# Patient Record
Sex: Male | Born: 1940 | Race: White | Hispanic: No | Smoking: Never smoker
Health system: Southern US, Community
[De-identification: ages and names within clinical notes are randomized; demographics above are authoritative.]

## PROBLEM LIST (undated history)

## (undated) DIAGNOSIS — I10 Essential (primary) hypertension: Secondary | ICD-10-CM

## (undated) DIAGNOSIS — G4730 Sleep apnea, unspecified: Secondary | ICD-10-CM

## (undated) DIAGNOSIS — I639 Cerebral infarction, unspecified: Secondary | ICD-10-CM

## (undated) DIAGNOSIS — Z87442 Personal history of urinary calculi: Secondary | ICD-10-CM

## (undated) DIAGNOSIS — J189 Pneumonia, unspecified organism: Secondary | ICD-10-CM

## (undated) DIAGNOSIS — J45909 Unspecified asthma, uncomplicated: Secondary | ICD-10-CM

## (undated) DIAGNOSIS — N400 Enlarged prostate without lower urinary tract symptoms: Secondary | ICD-10-CM

## (undated) DIAGNOSIS — M1990 Unspecified osteoarthritis, unspecified site: Secondary | ICD-10-CM

## (undated) DIAGNOSIS — R519 Headache, unspecified: Secondary | ICD-10-CM

## (undated) DIAGNOSIS — G459 Transient cerebral ischemic attack, unspecified: Secondary | ICD-10-CM

## (undated) DIAGNOSIS — K8020 Calculus of gallbladder without cholecystitis without obstruction: Secondary | ICD-10-CM

## (undated) DIAGNOSIS — J329 Chronic sinusitis, unspecified: Secondary | ICD-10-CM

## (undated) DIAGNOSIS — N200 Calculus of kidney: Secondary | ICD-10-CM

## (undated) HISTORY — PX: HERNIA REPAIR: SHX51

## (undated) HISTORY — PX: TONSILLECTOMY: SUR1361

## (undated) HISTORY — DX: Calculus of gallbladder without cholecystitis without obstruction: K80.20

## (undated) HISTORY — PX: INGUINAL HERNIA REPAIR: SUR1180

## (undated) HISTORY — PX: UMBILICAL HERNIA REPAIR: SHX196

## (undated) HISTORY — PX: CHOLECYSTECTOMY: SHX55

## (undated) MED ORDER — TAMSULOSIN SR 0.4 MG 24 HR CAP
0.4 mg | ORAL_CAPSULE | ORAL | Status: DC
Start: ? — End: 2013-11-11

## (undated) MED ORDER — TAMSULOSIN SR 0.4 MG 24 HR CAP
0.4 mg | ORAL_CAPSULE | ORAL | Status: DC
Start: ? — End: 2015-11-30

## (undated) MED ORDER — TAMSULOSIN SR 0.4 MG 24 HR CAP
0.4 mg | ORAL_CAPSULE | Freq: Every day | ORAL | Status: DC
Start: ? — End: 2014-11-26

---

## 1998-10-17 DIAGNOSIS — I10 Essential (primary) hypertension: Secondary | ICD-10-CM

## 1998-10-17 DIAGNOSIS — I639 Cerebral infarction, unspecified: Secondary | ICD-10-CM | POA: Insufficient documentation

## 1998-10-17 HISTORY — DX: Essential (primary) hypertension: I10

## 2007-06-04 DIAGNOSIS — J45909 Unspecified asthma, uncomplicated: Secondary | ICD-10-CM | POA: Insufficient documentation

## 2007-06-04 DIAGNOSIS — N400 Enlarged prostate without lower urinary tract symptoms: Secondary | ICD-10-CM | POA: Insufficient documentation

## 2007-09-03 DIAGNOSIS — I69998 Other sequelae following unspecified cerebrovascular disease: Secondary | ICD-10-CM | POA: Insufficient documentation

## 2007-09-03 DIAGNOSIS — H539 Unspecified visual disturbance: Secondary | ICD-10-CM | POA: Insufficient documentation

## 2007-11-20 DIAGNOSIS — B009 Herpesviral infection, unspecified: Secondary | ICD-10-CM | POA: Insufficient documentation

## 2007-11-29 LAB — PSA SCREENING (SCREENING): Prostate Specific Ag: 4.4

## 2008-04-29 LAB — PSA SCREENING (SCREENING): Prostate Specific Ag: 1.66

## 2011-04-13 DIAGNOSIS — K851 Biliary acute pancreatitis without necrosis or infection: Secondary | ICD-10-CM | POA: Insufficient documentation

## 2011-04-28 MED ORDER — FLOMAX 0.4 MG CAPSULE
0.4 mg | ORAL_CAPSULE | ORAL | Status: DC
Start: 2011-04-28 — End: 2011-06-08

## 2011-04-28 NOTE — Telephone Encounter (Signed)
Patient scheduled for appointment August 29,2012 @ 10 am

## 2011-06-08 MED ORDER — TAMSULOSIN SR 0.4 MG 24 HR CAP
0.4 mg | ORAL_CAPSULE | Freq: Two times a day (BID) | ORAL | Status: DC
Start: 2011-06-08 — End: 2011-08-02

## 2011-06-14 DIAGNOSIS — H532 Diplopia: Secondary | ICD-10-CM | POA: Insufficient documentation

## 2011-06-15 NOTE — Progress Notes (Signed)
Producer, television/film/video  Note Status  Last Update User  Last Update Date/Time     Randolph Bing, MD  Signed  Randolph Bing, MD  07/21/09 09:03 AM      Encounter Notes      The patient is a very pleasant 70 year old gentleman, followed medically by [name] who presents for evaluation of longstanding recurrent urolithiasis, dating back to age 7. . I was asked to see him in consultation to further evaluate and recommend management . He has not been evaluated for this problem for at least 10-15 years. He was recently seen by [name] last year with a dx of a bladder stone.   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 Acalculus is noted in the bladder region at the left right UVJ region. There is no associated hydronephrosis.Diverticulosis   Patient was seen last year for an elevated psa which normalized after antibx.   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 KUB to determine whether any renal stones may be amenable to ESWL showed only two 3mm stones left kidney and too small for ESWL treatmnet   2. Litholink for metabolic WU not completed   3. Stone analysis if another stone passed   4.Literature given and diet discussed re stone prevention   5.follow up to counsel re prevention if metabolic workup completed   I would like to thank Dr Glori Bickers for allowing me the opportunity to assist with his Care.   In the course of this visit on this date, I spent 15 minutes with the patient of which 50%f time involved face-to-face examination, counseling, or discussion of care.

## 2011-08-03 MED ORDER — TAMSULOSIN SR 0.4 MG 24 HR CAP
0.4 mg | ORAL_CAPSULE | ORAL | Status: DC
Start: 2011-08-03 — End: 2012-04-03

## 2011-09-22 DIAGNOSIS — K429 Umbilical hernia without obstruction or gangrene: Secondary | ICD-10-CM | POA: Insufficient documentation

## 2012-02-22 LAB — AMB POC URINALYSIS DIP STICK AUTO W/O MICRO
Bilirubin (UA POC): NEGATIVE
Blood (UA POC): NEGATIVE
Glucose (UA POC): NEGATIVE
Ketones (UA POC): NEGATIVE
Leukocyte esterase (UA POC): NEGATIVE
Nitrites (UA POC): NEGATIVE
Protein (UA POC): NEGATIVE mg/dL
Specific gravity (UA POC): 1.02 (ref 1.001–1.035)
Urobilinogen (UA POC): 0.2 (ref 0.2–1)
pH (UA POC): 6.5 (ref 4.6–8.0)

## 2012-02-22 NOTE — Progress Notes (Signed)
Created By: Randolph Bing, MD User Type: Physician Created: 07/21/2009 9:03 AM    Note Status: Signed Cosign: Cosign Not Required Note Time: 07/21/2009 9:03 AM           The patient is a very pleasant 71 year old gentleman, followed medically by [name] who presents for evaluation of longstanding recurrent urolithiasis, dating back to age 29. . I was asked to see him in consultation to further evaluate and recommend management . He has not been evaluated for this problem for at least 10-15 years. He was recently seen by [name] last year with a dx of a bladder stone.   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 Acalculus is noted in the bladder region at the left right UVJ region. There is no associated hydronephrosis.Diverticulosis   Patient was seen last year for an elevated psa which normalized after antibx.   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 KUB to determine whether any renal stones may be amenable to ESWL showed only two 3mm stones left kidney and too small for ESWL treatmnet   2. Litholink for metabolic WU not completed   3. Stone analysis if another stone passed   4.Literature given and diet discussed re stone prevention   5.follow up to counsel re prevention if metabolic workup completed   I would like to thank Dr Glori Bickers for allowing me the opportunity to assist with his Care.   In the course of this visit on this date, I spent 15 minutes with the patient of which 50%f time involved face-to-face examination, counseling, or discussion of care.                          Documentation Flowsheet Data as of 07/21/2009      No data found.                    Reason for  Visit      KIDNEY STONE                Office Visit [name]   EMR #: [id number]   07/21/2009 Office Visit                        Patient Information        Patient Information      Patient Name Sex DOB SSN    Ricardo Rivers, [place] Male 1940-12-01 [id number]               Patient Demographics      Address Phone    [address]  [place] [address] [phone] [place])  [phone] (Mobile)               Patient PCP Information      Provider PCP Type    Carney Harder, MD General               Allergies as of 07/21/2009 Reviewed on: 07/21/2009 Reviewed by: Aldean Baker H: Reviewed in Chart      Allergen Noted Type Reactions  Ivp Dye (Environmental)

## 2012-02-22 NOTE — Progress Notes (Signed)
Chief Complaint   Patient presents with   ??? Kidney Stone       Assessment:    1.63mm left distal ureteral stone, ? passed  2.Remote hx of multiple prior ureteral stones, passed      PLAN:  1. In view of hx, no further pain, the fact that he has passed multiple stones in the past, he requests period of time to see if the stone will pass spontaneosly.  2. I have explained that usually a ureteral stone passed into the bladder can be expelled.  3.Should he not pass the stone, then cysto with possible stone extraction will be considered.  4.Risks associated with the cysto and stone removal discussed including bleeding, infection, retention.  5.Additionally, explained that if the stone should be present, that indeed it might cause retention  6. Discussed details and plans, rationale for treatment and studies  7.All questions answered.  8.Cont flomax 0.8 mg daily      History of present Illness:    The patient is a very pleasant  71 yo wm  , followed medically by Dr. Olevia Perches, MD, who presents for  follow up  of left renal colic evaluated in ER [place]. Given analgesics in ER with pain disappearing. Seem 02/19/12. He is  to be seen today in follow up  to  evaluate, discuss,  and recommend management. CT reflected a 5mm left distal stone, probably passed. Patient expresses concern about not yet passing stone.  He  was last seen on 5 years ago with stone passed spontaneously, no metabolic workup done.  No further pain today, and voids well on flomax 2 daily.  No recent fever or chills      Regarding his urinary symptoms,   Hematuria: .no  Nocturia x: 1  Daytime Frequency x: Q3 hour  Urgency: no  Dysuria: no  Incontinence: no         0 pads/day  Suprapubic Pressure: no  Hesitancy: no  Decreased force of stream: no  Straining to void: no  Intermittency:: no      Review of System:    Constitutional: negative for fever, malaise/fatigue, weakness and weight loss  Cardiovascular: negative for chest pain and leg swelling  Gastrointestinal: negative for abdominal pain, diarrhea, nausea and vomiting  Integument/breast: negative for itching and rash  Hematologic/lymphatic: negative for easy bruising/bleeding  Musculoskeletal:negative for falls and myalgias  Neurological: negative for dizziness, focal weakness and LOC    Pertinent past Medical, Family and Social History:     Past Medical History   Diagnosis Date   ??? Diplopia    ??? Wheezing    ??? Nocturia    ??? Heartburn    ??? Stuffy and runny nose    ??? PND (post-nasal drip)    ??? Chronic bronchitis    ??? Asthma    ??? Community acquired pneumonia    ??? Cough    ??? Hypertension    ??? Kidney stones      Past Surgical History   Procedure Date   ??? Hx hernia repair 1978, 1985   ??? Hx knee arthroscopy 2007   ??? Hx lap cholecystectomy 04/14/2011     History     Social History   ??? Marital Status: DIVORCED     Spouse Name: N/A     Number of Children: N/A   ??? Years of Education: N/A     Occupational History   ??? Not on file.     Social History Main Topics   ??? Smoking status: Never Smoker    ??? Smokeless tobacco: Not on file   ??? Alcohol Use: 0.5 oz/week     1 Glasses of wine per week   ??? Drug Use: No   ??? Sexually Active: Not on file     Other Topics Concern   ??? Not on file     Social History Narrative   ??? No narrative on file     No family history on file.   Allergies   Allergen Reactions   ??? Iodinated Contrast Media - Iv Dye Hives     Prior to Admission medications    Medication Sig Start Date End Date Taking? Authorizing Provider   tamsulosin (FLOMAX) 0.4 mg capsule TAKE ONE CAPSULE BY MOUTH TWICE DAILY AFTER MEALS 08/02/11  Yes Randolph Bing, MD   atenolol (TENORMIN) 50 mg tablet Take  by mouth daily.     Yes Historical Provider   oxyCODONE-acetaminophen (PERCOCET) 5-325 mg per tablet Take 1 Tab by mouth every four (4) hours as needed.      Historical Provider   calcium carbonate (TUMS) 200 mg calcium (500 mg) Chew Take 1 Tab by mouth daily.      Historical Provider           Physical exam:    BP 130/82   Ht 5\' 5"  (1.651 m)   Wt 195 lb (88.451 kg)   BMI 32.45 kg/m2  WDWN  70 YO  wm in NAD, Oriented x 3  HEENT: NC, EOMI  Neck: symmetrical, supple  Chest: normal respiratory effort  Abdomen: soft, NT, no guarding/rebound, no mass appreciated/ no HSM, no CVA  BACK: Negative for spinal and CVA tenderness  Groins: no hernia appreciated, no masses or tenderness  GU: Normal circ phallus, patent meatus,no lesions  Normal scrotum w/ no lesion  Normal testes  Perineum normal with no lesions  Rectal : normal tone, no stricture  Prostate: 20  grams, smooth, symmetrical, no nodule, nontender  Seminal vesicles non-palpable  Extremities:  No c/c/e  Neuro:  A&O x 3, no focal deficits    Results for orders placed in visit on 02/22/12   AMB POC URINALYSIS DIP STICK AUTO W/O MICRO       Component Value Range    Color Yellow  (none)    Clarity Clear  (none)    Glucose Negative  (none)    Bilirubin Negative  (none)    Ketones Negative  (none)    Spec.Grav. 1.020  1.001 - 1.035    Blood Negative  (none)    pH 6.5  4.6 - 8.0    Protein, Urine Negative  Negative mg/dL    Urobilinogen 0.2 mg/dL  0.2 - 1    Nitrites Negative  (none)    Leukocyte esterase Negative  (none)         Results for orders placed in visit on 06/14/11   PSA SCREENING (SCREENING)       Component Value Range    Prostate  Specific Ag 4.4 (H)     PSA SCREENING (SCREENING)       Component Value Range    Prostate Specific Ag                                    4.4 with  1.660 (value repeated after above)            I would like to thank Dr Olevia Perches, MD  for allowing me the opportunity to assist with his care.    Tresa Endo, MD      In the course of this visit on this date, I spent 30 minutes with the patient of which 50% of time  involved face-to-face examination, counseling, or discussion of care.

## 2012-04-05 MED ORDER — TAMSULOSIN SR 0.4 MG 24 HR CAP
0.4 mg | ORAL_CAPSULE | Freq: Every day | ORAL | Status: DC
Start: 2012-04-05 — End: 2013-05-15

## 2012-10-16 NOTE — Telephone Encounter (Signed)
Patient called to report that he is going to have cataract surgery on Jan 23 and his doctor advised that he not take the medication flomax because medication may interfere with eye surgery    Pt stated that he stopped taking flomax 2 weeks ago and will have to off medication from one month to 6 weeks.  Pt states that presently his urine stream is not strong, it take a long time to urinate.   Pt wants to know if there is an alternative medication he can take    Informed pt that I will notify Dr Mayme Genta and will contact him with his response    Pt can be reached at [phone] cell

## 2012-10-16 NOTE — Telephone Encounter (Signed)
Talked with patient and told him there was no other medicine that he can take now while he has his cataract surgery. I told pt that i can teach him self cath for a period of time that he has to be off the flomax but pt does not want to do this at all Earla Charlie S. Justus Duerr, LPN

## 2013-10-17 HISTORY — PX: CATARACT EXTRACTION, BILATERAL: SHX1313

## 2013-11-13 NOTE — Telephone Encounter (Signed)
Received call 11/13/2013 from pt requesting medication (Flomax 0.4mg ) called in to pharmacy on file. Pt states someone told him that it was in. Pt spoke with pharmacist and was informed medication rx has not been received from his urologist. Pt can be reached on phone number [phone] if there are any questions.

## 2013-11-13 NOTE — Telephone Encounter (Signed)
flomax 0.4mg  2 cap qhs 3 months with 3 refills called in to pharm on file. Mechele Kittleson S. Symantha Steeber, LPN

## 2013-12-18 LAB — PSA, DIAGNOSTIC (PROSTATE SPECIFIC AG): Prostate Specific Ag: 2.7 ng/mL (ref 0–4)

## 2014-07-02 DIAGNOSIS — G4733 Obstructive sleep apnea (adult) (pediatric): Secondary | ICD-10-CM | POA: Insufficient documentation

## 2014-11-04 LAB — PSA, DIAGNOSTIC (PROSTATE SPECIFIC AG): Prostate Specific Ag: 3.4 ng/mL (ref 0–4)

## 2014-11-30 MED ORDER — TAMSULOSIN SR 0.4 MG 24 HR CAP
0.4 mg | ORAL_CAPSULE | ORAL | Status: DC
Start: 2014-11-30 — End: 2015-01-15

## 2014-12-01 NOTE — Telephone Encounter (Signed)
Pt called to get refills on med flomax. Pt was informed that flomax was called into pharmacy. I spoke with [employer] manager([facility]) to confirm the med and he stated it will be ready by 9:30 am. I informed pt that med will be ready to pick up at 9:30am. Pt is requesting to add refills to flomax so he does not have to keep calling the office. He stated it is difficult to get the med.

## 2014-12-01 NOTE — Telephone Encounter (Signed)
Pt knows i can not give him any refills  On the flomax since he has not been here since 2013. Brytni Dray S. Flossie Wexler, LPN

## 2014-12-08 ENCOUNTER — Encounter: Payer: MEDICARE | Primary: Internal Medicine

## 2015-01-15 ENCOUNTER — Ambulatory Visit: Admit: 2015-01-15 | Discharge: 2015-01-15 | Payer: MEDICARE | Attending: Urology | Primary: Internal Medicine

## 2015-01-15 ENCOUNTER — Encounter: Attending: Urology | Primary: Internal Medicine

## 2015-01-15 DIAGNOSIS — R399 Unspecified symptoms and signs involving the genitourinary system: Secondary | ICD-10-CM

## 2015-01-15 LAB — AMB POC URINALYSIS DIP STICK AUTO W/O MICRO
Bilirubin (UA POC): NEGATIVE
Blood (UA POC): NEGATIVE
Glucose (UA POC): NEGATIVE
Ketones (UA POC): NEGATIVE
Leukocyte esterase (UA POC): NEGATIVE
Nitrites (UA POC): NEGATIVE
Protein (UA POC): NEGATIVE mg/dL
Specific gravity (UA POC): 1.01 (ref 1.001–1.035)
Urobilinogen (UA POC): 0.2 (ref 0.2–1)
pH (UA POC): 6 (ref 4.6–8.0)

## 2015-01-15 LAB — AMB POC PVR, MEAS,POST-VOID RES,US,NON-IMAGING: PVR: 15 cc

## 2015-01-15 MED ORDER — TAMSULOSIN SR 0.4 MG 24 HR CAP
0.4 mg | ORAL_CAPSULE | Freq: Every day | ORAL | Status: DC
Start: 2015-01-15 — End: 2016-02-22

## 2015-01-15 NOTE — Progress Notes (Signed)
Ricardo Rivers   11-05-1940       Assessment:      ICD-10-CM ICD-9-CM    1. Lower urinary tract symptoms (LUTS) R39.9 788.99 AMB POC URINALYSIS DIP STICK AUTO W/O MICRO      AMB POC PVR, MEAS,POST-VOID RES,US,NON-IMAGING   2. Weak urinary stream R39.12 788.62 AMB POC URINALYSIS DIP STICK AUTO W/O MICRO      AMB POC PVR, MEAS,POST-VOID RES,US,NON-IMAGING   3. History of kidney stones Z87.442 V13.01 AMB POC URINALYSIS DIP STICK AUTO W/O MICRO     1. History of kidney stones with last stone passage in 2013. Currently asymptomatic.   2. BPH with LUTS, specifically weak FOS, currently on Flomax 0.8mg  daily with benefit. Requesting refills.   3. Prostate cancer screening done by PCP        PLAN:  1. Emptying well with PVR of 15cc today  2. Cont Flomax 0.8 mg daily, refill provided today   3. Continue annual PSA screening with PCP  4. FU in 1 year to reassess, sooner if problems    Chief Complaint   Patient presents with   ??? Kidney Stone       History of present Illness:    Ricardo Rivers is a 74 y.o. male who presents today in follow up for known history of kidney stones and LUTS. Patient was last seen in 02/2012. He was seen at that time for a distal ureteral stone which he passed spontaneously. No interim stone episodes. Currently asymptomatic for stone. No flank pain, nausea, or vomiting. No prior metabolic workup.     Reports weak FOS and nocturia x1 at baseline. Currently on Flomax 0.8mg  daily with benefit. Previously tried to discontinue the medication however voiding symptoms worsened. Denies gross hematuria, dysuria, urgency, or frequency.     CaP screening done by PCP per patient report.     Erectile Status: Denies ED  Review of Systems  Constitutional: Fever: No  Skin: Rash: No  HEENT: Hearing difficulty: No  Eyes: Blurred vision: No  Cardiovascular: Chest pain: No  Respiratory: Shortness of breath: No  Gastrointestinal: Nausea/vomiting: No  Musculoskeletal: Back pain: No  Neurological: Weakness: No  Psychological: Memory loss: No  Comments/additional findings:       Past Medical History   Diagnosis Date   ??? Diplopia    ??? Wheezing    ??? Nocturia    ??? Heartburn    ??? Stuffy and runny nose    ??? PND (post-nasal drip)    ??? Chronic bronchitis (HCC)    ??? Asthma    ??? Community acquired pneumonia    ??? Cough    ??? Hypertension    ??? Kidney stones    ??? Degenerative arthritis of spine      Past Surgical History   Procedure Laterality Date   ??? Hx hernia repair  1978, 1985   ??? Hx knee arthroscopy  2007   ??? Hx lap cholecystectomy  04/14/2011   ??? Hx cataract removal  2015 x 2     History     Social History   ??? Marital Status: DIVORCED     Spouse Name: N/A   ??? Number of Children: N/A   ??? Years of Education: N/A     Occupational History   ??? Not on file.     Social History Main Topics   ??? Smoking status: Never Smoker    ??? Smokeless tobacco: Not on file   ??? Alcohol Use: 0.5 oz/week     1 Glasses of wine per week   ??? Drug Use: No   ??? Sexual Activity: Not on file     Other Topics Concern   ??? Not on file     Social History Narrative     History reviewed. No pertinent family history.  Allergies   Allergen Reactions   ??? Iodinated Contrast Media - Iv Dye Hives     Prior to Admission medications    Medication Sig Start Date End Date Taking? Authorizing Provider   tamsulosin (FLOMAX) 0.4 mg capsule Take 2 Caps by mouth daily. 01/15/15  Yes Randolph BingJacob R Jasleen Riepe, MD   tamsulosin (FLOMAX) 0.4 mg capsule TAKE TWO CAPSULES BY MOUTH EVERY DAY 11/11/13  Yes Randolph BingJacob R Teeghan Hammer, MD   atenolol (TENORMIN) 50 mg tablet Take  by mouth daily.     Yes Historical  Provider   oxyCODONE-acetaminophen (PERCOCET) 5-325 mg per tablet Take 1 Tab by mouth every four (4) hours as needed.      Historical Provider   calcium carbonate (TUMS) 200 mg calcium (500 mg) Chew Take 1 Tab by mouth daily.      Historical Provider         Physical exam:    BP 120/70 mmHg   Ht 5\' 4"  (1.626 m)   Wt 200 lb (90.719 kg)   BMI 34.31 kg/m2  Constitutional: WDWN, Pleasant and appropriate affect, No acute distress.    CV:  No peripheral swelling noted  Respiratory: No respiratory distress or difficulties  Abdomen:  No abdominal masses or tenderness.  No CVA tenderness. No hernias noted.   GU Male:    ZOX:[id number]:Perineum normal to visual inspection, no erythema or irritation, Sphincter with good tone, Rectum with no hemorrhoids, fissures or masses.  Prostate is 25g, smooth, symmetric, anodular  SCROTUM:  No scrotal rash or lesions noticed.  Normal bilateral testes and epididymis.  PENIS: Urethral meatus normal in location and size. No urethral discharge.  Skin: No jaundice.    Neuro/Psych:  Alert and oriented x 3. Affect appropriate.   Lymphatic:   No enlarged inguinal lymph nodes.        Results for orders placed or performed in visit on 01/15/15   AMB POC URINALYSIS DIP STICK AUTO W/O MICRO   Result Value Ref Range    Color (UA POC) Yellow     Clarity (UA POC) Clear     Glucose (UA POC) Negative Negative    Bilirubin (UA POC) Negative Negative    Ketones (UA POC) Negative Negative    Specific gravity (UA POC) 1.010 1.001 - 1.035    Blood (UA POC) Negative Negative    pH (UA POC) 6.0 4.6 - 8.0    Protein (UA POC) Negative Negative mg/dL    Urobilinogen (UA POC) 0.2 mg/dL 0.2 - 1    Nitrites (UA POC) Negative Negative    Leukocyte esterase (UA POC) Negative Negative   AMB POC PVR, MEAS,POST-VOID RES,US,NON-IMAGING   Result Value Ref Range    PVR 15 cc       I would like to thank Dr Olevia Perches, MD  for allowing me the opportunity to assist with his care.    Tresa Endo, MD     Medical Documentation is provided with the assistance of [facility], medical scribe for Randolph Bing, MD.

## 2015-10-29 ENCOUNTER — Institutional Professional Consult (permissible substitution): Admit: 2015-10-29 | Discharge: 2015-10-30 | Payer: MEDICARE | Primary: Internal Medicine

## 2015-10-29 DIAGNOSIS — R350 Frequency of micturition: Secondary | ICD-10-CM

## 2015-10-29 LAB — AMB POC URINALYSIS DIP STICK AUTO W/O MICRO
Bilirubin (UA POC): NEGATIVE
Glucose (UA POC): NEGATIVE
Ketones (UA POC): NEGATIVE
Nitrites (UA POC): NEGATIVE
Protein (UA POC): NEGATIVE mg/dL
Specific gravity (UA POC): 1.025 (ref 1.001–1.035)
Urobilinogen (UA POC): 1 (ref 0.2–1)
pH (UA POC): 6 (ref 4.6–8.0)

## 2015-10-29 NOTE — Progress Notes (Signed)
Ricardo Rivers is here today for urine culture per Dr. Mayme Gentarucker order.    Dr. Winnifred Friarobey was in the office as incident to.        Visit Vitals   ??? Temp 98.1 ??F (36.7 ??C) (Oral)   ??? Ht 5\' 5"  (1.651 m)   ??? Wt 200 lb (90.7 kg)   ??? BMI 33.28 kg/m2        Urine was obtained by:  Clean Catch     The pain is no.  Severity:  0     Associated signs and symptoms:    urgency and frequency    Patient c/o odor to urine NO  Decreased output: NO  Difficulty voiding: NO  Burning with urination: YES  Incontinence:  NO  Split Stream: NO    Any recent Urologic surgeries or procedures:  No  If so - what type of surgery:  N/A    Any History of Stones:  YES     History of bladder tumor:  NO  Recurrent UTI's:  NO    This is not a repeat urine culture.   UA performed: Yes    Results for orders placed or performed in visit on 10/29/15   AMB POC URINALYSIS DIP STICK AUTO W/O MICRO   Result Value Ref Range    Color (UA POC) Yellow     Clarity (UA POC) Clear     Glucose (UA POC) Negative Negative    Bilirubin (UA POC) Negative Negative    Ketones (UA POC) Negative Negative    Specific gravity (UA POC) 1.025 1.001 - 1.035    Blood (UA POC) Trace Negative    pH (UA POC) 6.0 4.6 - 8.0    Protein (UA POC) Negative Negative mg/dL    Urobilinogen (UA POC) 1 mg/dL 0.2 - 1    Nitrites (UA POC) Negative Negative    Leukocyte esterase (UA POC) Trace Negative        Urine was sent to [facility] lab for processing of urine culture.   Patient is informed that it will be at least 48 hours before results are available     Orders Placed This Encounter   ??? URINE C&S     Order Specific Question:   Specify all ANTIBIOTIC ALLERGIES:     Answer:   No Known Antibiotic Allergies     Order Specific Question:   Specify the urine source     Answer:   Clean Catch   ??? AMB POC URINALYSIS DIP STICK AUTO W/O MICRO          Ricardo Rivers Ricardo Rivers      Reviewed by Randa NgoEdwin L Robey, MD on 10/29/2015.

## 2015-10-31 LAB — URINE C&S

## 2015-11-02 NOTE — Telephone Encounter (Signed)
FINAL REPORT Microbiology results ??   Comments: ANTIBIOTIC ALLERGIES*   No Known Antibiotic Allergies     URINE SOURCE:   Clean Catch     COLONY COUNT/RESULT:   20,000 cfu/ml Mixed Urogenital / Skin Flora           Called pt and informed of results.

## 2015-11-30 ENCOUNTER — Ambulatory Visit: Admit: 2015-11-30 | Discharge: 2015-11-30 | Payer: MEDICARE | Attending: Urology | Primary: Internal Medicine

## 2015-11-30 DIAGNOSIS — N400 Enlarged prostate without lower urinary tract symptoms: Secondary | ICD-10-CM

## 2015-11-30 LAB — AMB POC URINALYSIS DIP STICK AUTO W/O MICRO
Bilirubin (UA POC): NEGATIVE
Blood (UA POC): NEGATIVE
Glucose (UA POC): NEGATIVE
Ketones (UA POC): NEGATIVE
Nitrites (UA POC): NEGATIVE
Protein (UA POC): NEGATIVE mg/dL
Specific gravity (UA POC): 1.015 (ref 1.001–1.035)
Urobilinogen (UA POC): 1 (ref 0.2–1)
pH (UA POC): 6 (ref 4.6–8.0)

## 2015-11-30 LAB — AMB POC PVR, MEAS,POST-VOID RES,US,NON-IMAGING: PVR: 56 cc

## 2015-11-30 NOTE — Progress Notes (Signed)
Ricardo Rivers   1941-08-08     ASSESSMENT:    1. Right flank discomfort with history of kidney stones with last stone passage in 2013 and intermittent passage of "gravel".   2. BPH with LUTS, specifically weak FOS, currently on Flomax 0.8mg  daily  3. Prostate cancer screening done by PCP    PLAN:  1. Urine culture today - pending. Will contact him with the results.   2. Obtain a renal ultrasound and KUB at [facility] to assess his kidneys given his history of nephrolithiasis. Will contact him with the results.  3. Recommended taking Proscar 5 mg daily in view of his prostate size and urinary symptoms, but patient does not wish to start this given the possible side effects of erectile dysfunction.   4. Continue to take Flomax 0.8 mg daily.  5. Patient's BMI is out of the normal parameters. Information about BMI was given to the patient.  6. Return in 1 year for reassessment or sooner if any problems arise.     Chief Complaint   Patient presents with   ??? (LUTS) Lower Urinary Tract Symptoms   ??? Kidney Stone     Pt stated that he passed a stone 3-4 months ago    ??? Other     weak urinary stream with frequent dribbling during urination     History of Present Illness:    Ricardo Rivers is a 75 y.o. male, followed medically by Dr. Olevia Perches, who presents in follow up for BPH and history of nephrolithiasis. The patient has been doing well since his last visit. He has a weak force of stream with occasional urinary urgency, frequency, and nocturia x 0-1 at baseline. He denies any fevers, chills, nausea, vomiting, dysuria, gross hematuria, or urinary incontinence. He passed a distal ureteral stone spontaneously in 2013, but he has been passing "gravel" intermittently for the past 2-3 months. He has recovered the fragments, but he forgot to bring them into the office for analysis today. He reports having mild right flank discomfort, but he denies any abdominal pain. He continues to  take Flomax 0.8 mg daily. He tried to discontinue it in the past, but his symptoms worsened. The patient states that he is being screened for prostate cancer by his PCP, Dr. Renold Don.  Review of Systems  Constitutional: Fever: No  Skin: Rash: No  HEENT: Hearing difficulty: No  Eyes: Blurred vision: No  Cardiovascular: Chest pain: No  Respiratory: Shortness of breath: No  Gastrointestinal: Nausea/vomiting: No  Musculoskeletal: Back pain: No  Neurological: Weakness: No  Psychological: Memory loss: No  Comments/additional findings:     Past Medical History   Diagnosis Date   ??? Asthma    ??? Chronic bronchitis (HCC)    ??? Community acquired pneumonia    ??? Cough    ??? Degenerative arthritis of spine    ??? Diplopia    ??? Heartburn    ??? Hypertension    ??? Kidney stones    ??? Lower urinary tract symptoms (LUTS)    ??? Nocturia    ??? PND (post-nasal drip)    ??? Stuffy and runny nose    ??? Weak urinary stream    ??? Wheezing      Past Surgical History   Procedure Laterality Date   ??? Hx hernia repair  1978, 1985   ??? Hx knee arthroscopy  2007   ??? Hx lap cholecystectomy  04/14/2011   ??? Hx cataract removal  2015 x 2     Social History     Social History   ??? Marital status: DIVORCED     Spouse name: N/A   ??? Number of children: N/A   ??? Years of education: N/A     Occupational History   ??? Not on file.     Social History Main Topics   ??? Smoking status: Never Smoker   ??? Smokeless tobacco: Not on file   ??? Alcohol use 0.5 oz/week     1 Glasses of wine per week   ??? Drug use: No   ??? Sexual activity: Not on file     Other Topics Concern   ??? Not on file     Social History Narrative     History reviewed. No pertinent family history.  Allergies   Allergen Reactions    ??? Iodinated Contrast Media - Oral And Iv Dye Hives     Prior to Admission medications    Medication Sig Start Date End Date Taking? Authorizing Provider   atenolol (TENORMIN) 100 mg tablet  10/16/15  Yes Historical Provider   tamsulosin (FLOMAX) 0.4 mg capsule Take 2 Caps by mouth daily. 01/15/15  Yes Randolph Bing, MD     Physical Exam:    Visit Vitals   ??? BP 128/70   ??? Ht  (1.651 m)   ??? Wt 200 lb (90.7 kg)   ??? BMI 33.28 kg/m2   Constitutional: WDWN, Pleasant and appropriate affect, No acute distress.    CV:  No peripheral swelling noted  Respiratory: No respiratory distress or difficulties  Abdomen:  No abdominal masses or tenderness.  No CVA tenderness. No hernias noted.   GU Male:    DRE: Perineum normal to visual inspection, no erythema or irritation, Sphincter with good tone, Rectum with no hemorrhoids, fissures or masses. Prostate is 30-35 grams, smooth, symmetric, anodular  SCROTUM:  No scrotal rash or lesions noticed.  Normal bilateral testes and epididymis.   PENIS: Urethral meatus normal in location and size. No urethral discharge.  Skin: No jaundice.    Neuro/Psych:  Alert and oriented x 3. Affect appropriate.   Lymphatic:   No enlarged inguinal lymph nodes.      REVIEW OF LABS:  Results for orders placed or performed in visit on 11/30/15  AMB POC URINALYSIS DIP STICK AUTO W/O MICRO   Result Value Ref Range    Color (UA POC) Yellow     Clarity (UA POC) Clear     Glucose (UA POC) Negative Negative    Bilirubin (UA POC) Negative Negative    Ketones (UA POC) Negative Negative    Specific gravity (UA POC) 1.015 1.001 - 1.035    Blood (UA POC) Negative Negative    pH (UA POC) 6.0 4.6 - 8.0    Protein (UA POC) Negative Negative mg/dL    Urobilinogen (UA POC) 1 mg/dL 0.2 - 1    Nitrites (UA POC) Negative Negative    Leukocyte esterase (UA POC) Trace Negative   AMB POC PVR, MEAS,POST-VOID RES,US,NON-IMAGING   Result Value Ref Range    PVR 56 cc      I would like to thank Dr. Darl Householder. Lex for allowing me the opportunity to assist with his care.    Tresa Endo, MD    Documentation was provided with the assistance of Jake Michaelis, medical scribe for Dr. Tresa Endo on 11/30/2015.

## 2015-12-02 LAB — URINE C&S

## 2015-12-04 NOTE — Telephone Encounter (Signed)
Ricardo R Drucker, MD  Coral[facility] Ga[facility] [place] ??   ??    ??    ??   ?? Please inform U Cx neg      Called pt to inform and no answer, left VM to call back.  OK to give results.

## 2015-12-04 NOTE — Progress Notes (Signed)
Please inform U Cx neg

## 2015-12-07 ENCOUNTER — Ambulatory Visit: Admit: 2015-12-07 | Discharge: 2015-12-08 | Payer: MEDICARE | Attending: Urology | Primary: Internal Medicine

## 2015-12-07 DIAGNOSIS — N4889 Other specified disorders of penis: Secondary | ICD-10-CM

## 2015-12-07 LAB — AMB POC URINALYSIS DIP STICK AUTO W/O MICRO
Glucose (UA POC): NEGATIVE
Ketones (UA POC): NEGATIVE
Leukocyte esterase (UA POC): NEGATIVE
Nitrites (UA POC): NEGATIVE
Protein (UA POC): NEGATIVE mg/dL
Specific gravity (UA POC): 1.025 (ref 1.001–1.035)
Urobilinogen (UA POC): 1 (ref 0.2–1)
pH (UA POC): 5.5 (ref 4.6–8.0)

## 2015-12-07 MED ORDER — CEPHALEXIN 500 MG CAP
500 mg | ORAL_CAPSULE | Freq: Three times a day (TID) | ORAL | 0 refills | Status: AC
Start: 2015-12-07 — End: 2015-12-14

## 2015-12-07 NOTE — Progress Notes (Signed)
Ricardo Rivers  DOB 01-18-1941      ASSESSMENT:   Encounter Diagnoses     ICD-10-CM ICD-9-CM   1. Penile irritation N48.89 607.89               PLAN:    Keflex TID x7 days, rx provided.   OTC abx ointment to glans  Urine for culture      Chief Complaint   Patient presents with   ??? Other     Pt states that he is having  pain on the tip of his penis and that his penis is sore, Pt states that it burns when he urinates          HISTORY OF PRESENT ILLNESS:   Ricardo Rivers is a 75 y.o. male who presents today for evaluation and treatment of penile irritation and dysuria. Patient followed by Dr. Mayme Genta, last seen 11/30/2015 for BPH and history of nephrolithiasis.      Recently with slight trauma to skin at tip of penis during oral intercourse.   Urine culture 11/30/2015 contaminated urine specimen.   Reports increasing discomfort after appt with Dr. Mayme Genta.   Now with redness at the tip of penis.   No recent abx course.   Tenderness present consitently.   Dysuria with urination.   No associated gross hematuria.   No f/c/n/v.     On Flomax 0.8mg  daily for LUTS.   No bothersome symptoms at this time.   No change in urination since last seen.       AUA Symptom Score 12/07/2015   Over the past month how often have you had the sensation that your bladder was not completely empty after you finished urinating? 3   Over the past month, how often have had to urinate again less than 2 hours after you last finished urinating? 3   Over the past month, how often have you found you stopped and started again several times when you urinated? 5   Over the past month, how often have you found it difficult to postpone urination? 3   Over the past month, how often have you had a weak urinary stream? 5   Over the past month, how often have you had to push or strain to begin urinating? 3   Over the past month, how many times did you most typically get up to urinate from the time you went to bed at night until the time you got up  in the morning? 1   AUA Score 23   If you were to spend the rest of your life with your urinary condition the way it is now, how would you feel about that? Unhappy        Past Medical History   Diagnosis Date   ??? Asthma    ??? Chronic bronchitis (HCC)    ??? Community acquired pneumonia    ??? Cough    ??? Degenerative arthritis of spine    ??? Diplopia    ??? Heartburn    ??? Hypertension    ??? Kidney stones    ??? Lower urinary tract symptoms (LUTS)    ??? Nocturia    ??? PND (post-nasal drip)    ??? Stuffy and runny nose    ??? Weak urinary stream    ??? Wheezing      Past Surgical History   Procedure Laterality Date   ??? Hx hernia repair  1978, 1985   ??? Hx knee arthroscopy  2007   ???  Hx lap cholecystectomy  04/14/2011   ??? Hx cataract removal  2015 x 2     No family history on file.  Social History     Social History   ??? Marital status: DIVORCED     Spouse name: N/A   ??? Number of children: N/A   ??? Years of education: N/A     Occupational History   ??? Not on file.     Social History Main Topics   ??? Smoking status: Never Smoker   ??? Smokeless tobacco: Not on file   ??? Alcohol use 0.5 oz/week     1 Glasses of wine per week   ??? Drug use: No   ??? Sexual activity: Not on file     Other Topics Concern   ??? Not on file     Social History Narrative     Allergies   Allergen Reactions   ??? Iodinated Contrast Media - Oral And Iv Dye Hives     Current Outpatient Prescriptions   Medication Sig Dispense Refill   ??? cephALEXin (KEFLEX) 500 mg capsule Take 1 Cap by mouth three (3) times daily for 7 days. 21 Cap 0   ??? atenolol (TENORMIN) 100 mg tablet      ??? tamsulosin (FLOMAX) 0.4 mg capsule Take 2 Caps by mouth daily. 180 Cap 3         Review of Systems  Constitutional: Fever: No  Skin: Rash: No  HEENT: Hearing difficulty: No  Eyes: Blurred vision: No  Cardiovascular: Chest pain: No  Respiratory: Shortness of breath: No  Gastrointestinal: Nausea/vomiting: No  Musculoskeletal: Back pain: Yes  Neurological: Weakness: No  Psychological: Memory loss: No   Comments/additional findings:       Physical Examination  Visit Vitals   ??? BP 122/80   ??? Ht  (1.651 m)   ??? Wt 200 lb (90.7 kg)   ??? BMI 33.28 kg/m2     Constitutional: WDWN, Pleasant and appropriate affect, No acute distress.    CV:  No peripheral swelling noted  Respiratory: No respiratory distress or difficulties  Abdomen:  No abdominal masses or tenderness.  No CVA tenderness.   GU Male:    SCROTUM:  No scrotal rash or lesions noticed.  Normal bilateral testes and epididymis.   PENIS: Urethral meatus normal in location and size. Erythema at tip of penis with slight tearing of skin, some golden eschar overlying tear in skin.   Skin: No jaundice.    Neuro/Psych:  Alert and oriented x 3. Affect appropriate.       Results for orders placed or performed in visit on 11/30/15   URINE C&S   Result Value Ref Range    FINAL REPORT Microbiology results    AMB POC URINALYSIS DIP STICK AUTO W/O MICRO   Result Value Ref Range    Color (UA POC) Yellow     Clarity (UA POC) Clear     Glucose (UA POC) Negative Negative    Bilirubin (UA POC) Negative Negative    Ketones (UA POC) Negative Negative    Specific gravity (UA POC) 1.015 1.001 - 1.035    Blood (UA POC) Negative Negative    pH (UA POC) 6.0 4.6 - 8.0    Protein (UA POC) Negative Negative mg/dL    Urobilinogen (UA POC) 1 mg/dL 0.2 - 1    Nitrites (UA POC) Negative Negative    Leukocyte esterase (UA POC) Trace Negative   AMB POC PVR, MEAS,POST-VOID RES,US,NON-IMAGING  Result Value Ref Range    PVR 56 cc            Laurier Nancy, MD on 12/07/2015       Medical Documentation is provided with the assistance of Sabrina A. Delfenthal, Medical Scribe for Laurier Nancy, MD on 12/07/2015

## 2015-12-07 NOTE — Telephone Encounter (Signed)
Pt contacted to discuss his negative urine culture. Pt states that jon Saturday he began having pain in the ip of his penis. He reported to an urgent care, with no real answer. He was told if it continues to f/u with Urology. He then had an increase in the pain over night and called this AM for guidance. He was instructed to come in this afternoon to be seen.

## 2015-12-09 LAB — URINE C&S

## 2015-12-09 NOTE — Telephone Encounter (Signed)
Patient called stated he had an appointment on Monday with Dr. Maple Hudson.The medication he is on has not helped and the pain is getting worse. He is requesting a return call @ [phone].

## 2015-12-11 NOTE — Telephone Encounter (Signed)
Unable to reach patient    Ricardo Erney D Agron Swiney, MD

## 2015-12-14 NOTE — Telephone Encounter (Signed)
Unable to reach patient  Left Vm to call back if he is still having issues    Laurier Nancy, MD

## 2015-12-15 DIAGNOSIS — E6609 Other obesity due to excess calories: Secondary | ICD-10-CM | POA: Insufficient documentation

## 2016-01-18 ENCOUNTER — Encounter: Attending: Urology | Primary: Internal Medicine

## 2016-02-10 NOTE — Telephone Encounter (Signed)
Patient called trying to get an appointment with Dr. Mayme Gentarucker. He is experiencing a weak urine stream that stops in the middle of urinating. He is requesting a return call @ [phone]. I offered him 03/03/16 he felt that was to far out.

## 2016-02-11 NOTE — Telephone Encounter (Signed)
Patient called trying to get an appointment with Dr. Mayme Gentarucker. He is experiencing a weak urine stream that stops in the middle of urinating. He is requesting a return call @ [phone]. I offered him 03/03/16 he felt that was to far out    Returned patients call to offer him to come in for a urine drop off while he can see Dr. Mayme Gentarucker. No answer, left VM to call back.

## 2016-02-15 ENCOUNTER — Ambulatory Visit: Admit: 2016-02-15 | Discharge: 2016-02-15 | Payer: MEDICARE | Attending: Urology | Primary: Internal Medicine

## 2016-02-15 DIAGNOSIS — N400 Enlarged prostate without lower urinary tract symptoms: Secondary | ICD-10-CM

## 2016-02-15 LAB — AMB POC URINALYSIS DIP STICK AUTO W/O MICRO
Bilirubin (UA POC): NEGATIVE
Blood (UA POC): NEGATIVE
Glucose (UA POC): NEGATIVE
Ketones (UA POC): NEGATIVE
Leukocyte esterase (UA POC): NEGATIVE
Nitrites (UA POC): NEGATIVE
Protein (UA POC): NEGATIVE mg/dL
Specific gravity (UA POC): 1.02 (ref 1.001–1.035)
Urobilinogen (UA POC): 0.2 (ref 0.2–1)
pH (UA POC): 6 (ref 4.6–8.0)

## 2016-02-15 LAB — AMB POC PVR, MEAS,POST-VOID RES,US,NON-IMAGING: PVR: 34 cc

## 2016-02-15 NOTE — Progress Notes (Signed)
Patient called trying to get an appointment with Dr. Mayme Gentarucker. He is experiencing a weak urine stream that stops in the middle of urinating. He is requesting a return call @ [phone]. I offered him 03/03/16 he felt that was to far out  ??  Returned patients call to offer him to come in for a urine drop off while he can see Dr. Mayme Gentarucker. No answer, left VM to call back    Patient seen by Dr. Mayme Gentarucker today 02/15/2016

## 2016-02-15 NOTE — Progress Notes (Signed)
Ricardo Rivers   07/25/1941     ASSESSMENT:    1. BPH with LUTS, specifically weak FOS, on Flomax 0.8 mg daily, PVR 34 cc today (02/15/16)  2. History of nephrolithiasis with last stone passage in 2013 and intermittent passage of "gravel"   3. Prostate cancer screening done by PCP    PLAN:  1. Recommended performing a Urodynamics Study to assess his bladder function. Patient indicates understanding and agrees to proceed with this.  2. Continue to take Flomax 0.8 mg daily.  3. Perform a 24-hour urinalysis to determine the cause of his stones.  4. Patient's BMI is out of the normal parameters. Information about BMI was given to the patient.  5. Follow up to review the results of his Urodynamics Study and Litholink and proceed with a cystoscopy.     Chief Complaint   Patient presents with   ??? Urinary Frequency   ??? Urgency   ??? Benign Prostatic Hypertrophy     History of Present Illness:    Ricardo Rivers is a 75 y.o. Caucasian male, followed medically by Ricardo Rivers Lex, who presents in follow up for BPH and history of nephrolithiasis. The patient has been doing well since his last visit. He states that he has passed 3 stones in the interim. He has a good force of stream in the morning, but it can be weak at night with urinary hesitancy. He states that it slows to a dribbling stream at times. He also reports occasional urinary urgency, frequency, and nocturia x 0-1 at baseline. He denies any fevers, chills, nausea, vomiting, dysuria, gross hematuria, urinary incontinence, abdominal pain, or flank pain. He continues to take Flomax 0.8 mg daily. He tried to discontinue it in the past, but his symptoms worsened. The patient states that he is being screened for prostate cancer by his PCP, Ricardo Rivers.                                                                                                                                                                                                                                                                                Review of Systems  Constitutional: Fever: No  Skin: Rash: No  HEENT: Hearing difficulty: No  Eyes: Blurred vision: No  Cardiovascular: Chest pain: No  Respiratory: Shortness of breath: No  Gastrointestinal: Nausea/vomiting: No  Musculoskeletal: Back pain: No  Neurological: Weakness: No  Psychological: Memory loss: No  Comments/additional findings:     Past Medical History:   Diagnosis Date   ??? Asthma    ??? Chronic bronchitis (HCC)    ??? Community acquired pneumonia    ??? Cough    ??? Degenerative arthritis of spine    ??? Diplopia    ??? Heartburn    ??? Hypertension    ??? Kidney stones    ??? Lower urinary tract symptoms (LUTS)    ??? Nocturia    ??? PND (post-nasal drip)    ??? Stuffy and runny nose    ??? Weak urinary stream    ??? Wheezing      Past Surgical History:   Procedure Laterality Date   ??? HX CATARACT REMOVAL  2015 x 2   ??? HX HERNIA REPAIR  1978, 1985   ??? HX KNEE ARTHROSCOPY  2007   ??? HX LAP CHOLECYSTECTOMY  04/14/2011     Social History     Social History   ??? Marital status: DIVORCED     Spouse name: N/A   ??? Number of children: N/A   ??? Years of education: N/A     Occupational History   ??? Not on file.     Social History Main Topics   ??? Smoking status: Never Smoker   ??? Smokeless tobacco: Not on file   ??? Alcohol use 0.5 oz/week     1 Glasses of wine per week   ??? Drug use: No   ??? Sexual activity: Not on file     Other Topics Concern   ??? Not on file     Social History Narrative     History reviewed. No pertinent family history.  Allergies   Allergen Reactions   ??? Iodinated Contrast Media - Oral And Iv Dye Hives     Prior to Admission medications    Medication Sig Start Date End Date Taking? Authorizing Provider   atenolol (TENORMIN) 100 mg tablet  10/16/15  Yes Historical Provider   tamsulosin (FLOMAX) 0.4 mg capsule Take 2 Caps by mouth daily. 01/15/15  Yes Ricardo Bing, MD     Physical Exam:    Visit Vitals    ??? BP 135/82   ??? Ht  (1.651 m)   ??? Wt 200 lb (90.7 kg)   ??? BMI 33.28 kg/m2   Constitutional: WDWN, Pleasant and appropriate affect, No acute distress.    CV:  No peripheral swelling noted  Respiratory: No respiratory distress or difficulties  Abdomen:  No abdominal masses or tenderness.  No CVA tenderness. No hernias noted.   GU Male (02/15/16):    DRE: Perineum normal to visual inspection, no erythema or irritation, Sphincter with good tone, Rectum with no hemorrhoids, fissures or masses. Prostate is 25 grams, smooth, symmetric, anodular  SCROTUM:  No scrotal rash or lesions noticed.  Normal bilateral testes and epididymis.   PENIS: Urethral meatus normal in location and size. No urethral discharge.  Skin: No jaundice.    Neuro/Psych:  Alert and oriented x 3. Affect appropriate.   Lymphatic:   No enlarged inguinal lymph nodes.      REVIEW OF LABS:  Results for orders placed or performed in visit on 02/15/16   AMB POC URINALYSIS DIP STICK  AUTO W/O MICRO   Result Value Ref Range    Color (UA POC) Yellow     Clarity (UA POC) Clear     Glucose (UA POC) Negative Negative    Bilirubin (UA POC) Negative Negative    Ketones (UA POC) Negative Negative    Specific gravity (UA POC) 1.020 1.001 - 1.035    Blood (UA POC) Negative Negative    pH (UA POC) 6.0 4.6 - 8.0    Protein (UA POC) Negative Negative mg/dL    Urobilinogen (UA POC) 0.2 mg/dL 0.2 - 1    Nitrites (UA POC) Negative Negative    Leukocyte esterase (UA POC) Negative Negative   AMB POC PVR, MEAS,POST-VOID RES,US,NON-IMAGING   Result Value Ref Range    PVR 34 cc     I would like to thank Dr. Darl HouseholderAlison M. Lex for allowing me the opportunity to assist with his care.    Tresa EndoJack Loveta Dellis, MD    Documentation was provided with the assistance of Jake MichaelisMichelle Li, medical scribe for Dr. Tresa EndoJack Lynnel Zanetti on 02/15/2016.

## 2016-02-16 ENCOUNTER — Encounter

## 2016-02-22 MED ORDER — TAMSULOSIN SR 0.4 MG 24 HR CAP
0.4 mg | ORAL_CAPSULE | ORAL | 0 refills | Status: DC
Start: 2016-02-22 — End: 2016-05-28

## 2016-02-24 ENCOUNTER — Encounter: Primary: Internal Medicine

## 2016-03-11 DIAGNOSIS — L304 Erythema intertrigo: Secondary | ICD-10-CM | POA: Insufficient documentation

## 2016-03-11 DIAGNOSIS — L702 Acne varioliformis: Secondary | ICD-10-CM | POA: Insufficient documentation

## 2016-03-22 NOTE — Telephone Encounter (Signed)
Patient has a F/U appointment with Dr. Mayme Gentarucker on 03/24/2016 to review litholink and UDS. Litholink results are not in the chart as well as any UDS visit. Called pt to ask if he got them performed and no answer, left VM to call back.

## 2016-03-24 ENCOUNTER — Encounter: Attending: Urology | Primary: Internal Medicine

## 2016-04-28 ENCOUNTER — Ambulatory Visit: Admit: 2016-04-28 | Discharge: 2016-04-28 | Payer: MEDICARE | Attending: Urology | Primary: Internal Medicine

## 2016-04-28 DIAGNOSIS — Z87442 Personal history of urinary calculi: Secondary | ICD-10-CM | POA: Insufficient documentation

## 2016-04-28 DIAGNOSIS — N400 Enlarged prostate without lower urinary tract symptoms: Secondary | ICD-10-CM

## 2016-04-28 LAB — AMB POC URINALYSIS DIP STICK AUTO W/O MICRO
Bilirubin (UA POC): NEGATIVE
Blood (UA POC): NEGATIVE
Glucose (UA POC): NEGATIVE
Ketones (UA POC): NEGATIVE
Nitrites (UA POC): NEGATIVE
Protein (UA POC): NEGATIVE mg/dL
Specific gravity (UA POC): 1.02 (ref 1.001–1.035)
Urobilinogen (UA POC): 1 (ref 0.2–1)
pH (UA POC): 5.5 (ref 4.6–8.0)

## 2016-04-28 LAB — AMB POC PVR, MEAS,POST-VOID RES,US,NON-IMAGING: PVR: 12 cc

## 2016-04-28 NOTE — Progress Notes (Signed)
Ricardo Rivers   [birth year]     ASSESSMENT:    1. BPH with LUTS, specifically weak FOS, improved on Flomax 0.8 mg daily, PVR 12 cc today (04/28/16)  2. History of nephrolithiasis with last stone passage in 2013 and intermittent passage of "gravel"   3. Prostate cancer screening done by PCP    PLAN:  1. Explained that he may experience spasms of his pelvic floor musculature following ejaculation.  2. Explained that his symptoms may have been related to prostatitis, but patient is not interested in treatment for this.  3. Patient is doing well. He does not have any urinary complaints. He is not interested in pursuing an evaluation for his previous symptoms.  4. Continue to take Flomax 0.8 mg daily.  5. Advised him to increase his daily fluid and citrate intake for stone prevention.   6. Patient's BMI is out of the normal parameters. Information about BMI was given to the patient.  7. Follow up in 1 year for reassessment.     Chief Complaint   Patient presents with   ??? Incomplete Bladder Emptying     History of Present Illness:    Ricardo Rivers is a 75 y.o. Caucasian male with a history of BPH and history of nephrolithiasis, followed medically by Dr. Darl HouseholderAlison M. Lex, who presents today complaining of urinary difficulty following sexual intercourse. He states that he had urinary hesitancy and intermittency a few times following sexual activity, but he does not have any urinary problems currently. He thinks that it may have overstrained his pelvic muscles. He does not have any urinary complaints at this time. He has a good force of stream in the morning, but it can be weak at night with urinary hesitancy. He denies any fevers, chills, nausea, vomiting, dysuria, gross hematuria, urinary incontinence, perineal pain, abdominal pain, or flank pain. He continues to take Flomax 0.8 mg daily with benefit. He tried to discontinue it in the past, but his symptoms  worsened. There were plans to schedule him for a Urodynamics Study, but he cancelled it because his symptoms resolved. He was also supposed to complete a metabolic urine evaluation ([facility]) prior to this visit, but he decided that he didn't want to do it. The patient states that he is being screened for prostate cancer by his PCP, Dr. Renold DonLex.  Review of Systems  Constitutional: Fever: No  Skin: Rash: No  HEENT: Hearing difficulty: No  Eyes: Blurred vision: No  Cardiovascular: Chest pain: No  Respiratory: Shortness of breath: No  Gastrointestinal: Nausea/vomiting: No  Musculoskeletal: Back pain: No  Neurological: Weakness: No  Psychological: Memory loss: No  Comments/additional findings:     Past Medical History:   Diagnosis Date   ??? Asthma    ??? Chronic bronchitis (HCC)    ??? Community acquired pneumonia    ??? Cough    ??? Degenerative arthritis of spine    ??? Diplopia    ??? Heartburn    ??? Hypertension    ??? Kidney stones    ??? Lower urinary tract symptoms (LUTS)    ??? Nocturia    ??? PND (post-nasal drip)    ??? Stuffy and runny nose    ??? Weak urinary stream    ??? Wheezing      Past Surgical History:   Procedure Laterality Date   ??? HX CATARACT REMOVAL  2015 x 2   ??? HX HERNIA REPAIR  1978, 1985   ??? HX KNEE ARTHROSCOPY  2007   ??? HX LAP CHOLECYSTECTOMY  04/14/2011     Social History     Social History   ??? Marital status: DIVORCED     Spouse name: N/A   ??? Number of children: N/A   ??? Years of education: N/A     Occupational History   ??? Not on file.     Social History Main Topics   ??? Smoking status: Never Smoker   ??? Smokeless tobacco: Never Used   ??? Alcohol use 0.5 oz/week     1 Glasses of wine per week   ??? Drug use: No   ??? Sexual activity: Not on file      Other Topics Concern   ??? Not on file     Social History Narrative     History reviewed. No pertinent family history.  Allergies   Allergen Reactions   ??? Iodinated Contrast- Oral And Iv Dye Hives     Prior to Admission medications    Medication Sig Start Date End Date Taking? Authorizing Provider   ciclopirox (LOPROX) 1 % sham  03/11/16  Yes Historical Provider   clindamycin (CLEOCIN T) 1 % external solution  03/15/16  Yes Historical Provider   tamsulosin (FLOMAX) 0.4 mg capsule TAKE 2 CAPSULES BY MOUTH DAILY 02/22/16  Yes Randolph Bing, MD   atenolol (TENORMIN) 100 mg tablet  10/16/15  Yes Historical Provider   oxyCODONE-acetaminophen (PERCOCET 10) 10-325 mg per tablet  04/27/16   Historical Provider     Physical Exam:    Visit Vitals   ??? BP 112/78   ??? Ht  (1.651 m)   ??? Wt 200 lb (90.7 kg)   ??? BMI 33.28 kg/m2   Constitutional: WDWN, Pleasant and appropriate affect, No acute distress.    CV:  No peripheral swelling noted  Respiratory: No respiratory distress or difficulties  Abdomen:  No abdominal masses or tenderness.  No CVA tenderness. No hernias noted.   GU Male (04/28/16):    DRE: Perineum normal to visual inspection, no erythema or irritation, Sphincter with good tone, Rectum with no hemorrhoids, fissures or masses. Prostate is 25 grams, smooth, symmetric, anodular, unchanged.  SCROTUM:  No scrotal rash or lesions noticed.  Normal bilateral testes and epididymis.   PENIS: Urethral meatus normal in location and size. No urethral discharge.  Skin: No  jaundice.    Neuro/Psych:  Alert and oriented x 3. Affect appropriate.   Lymphatic:   No enlarged inguinal lymph nodes.      REVIEW OF LABS:  Results for orders placed or performed in visit on 04/28/16   AMB POC URINALYSIS DIP STICK AUTO W/O MICRO   Result Value Ref Range    Color (UA POC) Yellow     Clarity (UA POC) Clear     Glucose (UA POC) Negative Negative    Bilirubin (UA POC) Negative Negative    Ketones (UA POC) Negative Negative     Specific gravity (UA POC) 1.020 1.001 - 1.035    Blood (UA POC) Negative Negative    pH (UA POC) 5.5 4.6 - 8.0    Protein (UA POC) Negative Negative mg/dL    Urobilinogen (UA POC) 1 mg/dL 0.2 - 1    Nitrites (UA POC) Negative Negative    Leukocyte esterase (UA POC) Trace Negative   AMB POC PVR, MEAS,POST-VOID RES,US,NON-IMAGING   Result Value Ref Range    PVR 12 cc     I would like to thank Dr. Darl Householder. Lex for allowing me the opportunity to assist with his care.    Tresa Endo, MD    Documentation was provided with the assistance of Jake Michaelis, medical scribe for Dr. Tresa Endo on 04/28/2016.

## 2016-05-30 MED ORDER — TAMSULOSIN SR 0.4 MG 24 HR CAP
0.4 mg | ORAL_CAPSULE | ORAL | 0 refills | Status: DC
Start: 2016-05-30 — End: 2016-08-29

## 2016-08-29 MED ORDER — TAMSULOSIN SR 0.4 MG 24 HR CAP
0.4 mg | ORAL_CAPSULE | ORAL | 0 refills | Status: DC
Start: 2016-08-29 — End: 2016-11-29

## 2016-10-18 DIAGNOSIS — M25511 Pain in right shoulder: Secondary | ICD-10-CM | POA: Diagnosis not present

## 2016-10-18 DIAGNOSIS — M064 Inflammatory polyarthropathy: Secondary | ICD-10-CM | POA: Diagnosis not present

## 2016-10-18 DIAGNOSIS — M150 Primary generalized (osteo)arthritis: Secondary | ICD-10-CM | POA: Diagnosis not present

## 2016-10-18 DIAGNOSIS — M160 Bilateral primary osteoarthritis of hip: Secondary | ICD-10-CM | POA: Diagnosis not present

## 2016-10-18 DIAGNOSIS — M25541 Pain in joints of right hand: Secondary | ICD-10-CM | POA: Diagnosis not present

## 2016-10-18 DIAGNOSIS — M25512 Pain in left shoulder: Secondary | ICD-10-CM | POA: Diagnosis not present

## 2016-10-18 DIAGNOSIS — M545 Low back pain: Secondary | ICD-10-CM | POA: Diagnosis not present

## 2016-10-26 DIAGNOSIS — L219 Seborrheic dermatitis, unspecified: Secondary | ICD-10-CM | POA: Diagnosis not present

## 2016-10-27 DIAGNOSIS — Z23 Encounter for immunization: Secondary | ICD-10-CM | POA: Diagnosis not present

## 2016-10-27 DIAGNOSIS — I10 Essential (primary) hypertension: Secondary | ICD-10-CM | POA: Diagnosis not present

## 2016-10-27 DIAGNOSIS — M549 Dorsalgia, unspecified: Secondary | ICD-10-CM | POA: Diagnosis not present

## 2016-11-09 DIAGNOSIS — R351 Nocturia: Secondary | ICD-10-CM | POA: Diagnosis not present

## 2016-11-09 DIAGNOSIS — Z91041 Radiographic dye allergy status: Secondary | ICD-10-CM | POA: Diagnosis not present

## 2016-11-09 DIAGNOSIS — Z79891 Long term (current) use of opiate analgesic: Secondary | ICD-10-CM | POA: Diagnosis not present

## 2016-11-09 DIAGNOSIS — M5116 Intervertebral disc disorders with radiculopathy, lumbar region: Secondary | ICD-10-CM | POA: Diagnosis not present

## 2016-11-09 DIAGNOSIS — M5416 Radiculopathy, lumbar region: Secondary | ICD-10-CM | POA: Diagnosis not present

## 2016-11-09 DIAGNOSIS — N401 Enlarged prostate with lower urinary tract symptoms: Secondary | ICD-10-CM | POA: Diagnosis not present

## 2016-11-09 DIAGNOSIS — G9009 Other idiopathic peripheral autonomic neuropathy: Secondary | ICD-10-CM | POA: Diagnosis not present

## 2016-11-09 DIAGNOSIS — M48061 Spinal stenosis, lumbar region without neurogenic claudication: Secondary | ICD-10-CM | POA: Diagnosis not present

## 2016-11-09 DIAGNOSIS — J45909 Unspecified asthma, uncomplicated: Secondary | ICD-10-CM | POA: Diagnosis not present

## 2016-11-09 DIAGNOSIS — Z79899 Other long term (current) drug therapy: Secondary | ICD-10-CM | POA: Diagnosis not present

## 2016-11-09 DIAGNOSIS — I10 Essential (primary) hypertension: Secondary | ICD-10-CM | POA: Diagnosis not present

## 2016-11-09 DIAGNOSIS — M5126 Other intervertebral disc displacement, lumbar region: Secondary | ICD-10-CM | POA: Diagnosis not present

## 2016-11-28 ENCOUNTER — Encounter: Attending: Urology | Primary: Internal Medicine

## 2016-12-01 MED ORDER — TAMSULOSIN SR 0.4 MG 24 HR CAP
0.4 mg | ORAL_CAPSULE | ORAL | 0 refills | Status: DC
Start: 2016-12-01 — End: 2017-03-07

## 2016-12-01 NOTE — Telephone Encounter (Signed)
S/w pt and [facility] him [facility]. Pt thanked me and call ended.  Ricardo Rivers

## 2016-12-16 DIAGNOSIS — R591 Generalized enlarged lymph nodes: Secondary | ICD-10-CM | POA: Diagnosis not present

## 2016-12-22 DIAGNOSIS — L720 Epidermal cyst: Secondary | ICD-10-CM | POA: Diagnosis not present

## 2017-01-24 DIAGNOSIS — L299 Pruritus, unspecified: Secondary | ICD-10-CM | POA: Diagnosis not present

## 2017-02-09 DIAGNOSIS — I10 Essential (primary) hypertension: Secondary | ICD-10-CM | POA: Diagnosis not present

## 2017-02-16 DIAGNOSIS — L720 Epidermal cyst: Secondary | ICD-10-CM | POA: Diagnosis not present

## 2017-02-16 DIAGNOSIS — D485 Neoplasm of uncertain behavior of skin: Secondary | ICD-10-CM | POA: Diagnosis not present

## 2017-02-17 DIAGNOSIS — G4733 Obstructive sleep apnea (adult) (pediatric): Secondary | ICD-10-CM | POA: Diagnosis not present

## 2017-02-17 DIAGNOSIS — E6609 Other obesity due to excess calories: Secondary | ICD-10-CM | POA: Diagnosis not present

## 2017-02-17 DIAGNOSIS — I10 Essential (primary) hypertension: Secondary | ICD-10-CM | POA: Diagnosis not present

## 2017-02-18 DIAGNOSIS — D485 Neoplasm of uncertain behavior of skin: Secondary | ICD-10-CM | POA: Diagnosis not present

## 2017-02-28 DIAGNOSIS — J329 Chronic sinusitis, unspecified: Secondary | ICD-10-CM | POA: Diagnosis not present

## 2017-02-28 DIAGNOSIS — J342 Deviated nasal septum: Secondary | ICD-10-CM | POA: Diagnosis not present

## 2017-02-28 DIAGNOSIS — J343 Hypertrophy of nasal turbinates: Secondary | ICD-10-CM | POA: Diagnosis not present

## 2017-03-07 MED ORDER — TAMSULOSIN SR 0.4 MG 24 HR CAP
0.4 mg | ORAL_CAPSULE | Freq: Every day | ORAL | 1 refills | Status: DC
Start: 2017-03-07 — End: 2017-03-15

## 2017-03-15 ENCOUNTER — Ambulatory Visit: Admit: 2017-03-15 | Discharge: 2017-03-15 | Payer: MEDICARE | Attending: Acute Care | Primary: Internal Medicine

## 2017-03-15 DIAGNOSIS — Z87442 Personal history of urinary calculi: Secondary | ICD-10-CM | POA: Diagnosis not present

## 2017-03-15 DIAGNOSIS — N200 Calculus of kidney: Secondary | ICD-10-CM | POA: Diagnosis not present

## 2017-03-15 DIAGNOSIS — N401 Enlarged prostate with lower urinary tract symptoms: Secondary | ICD-10-CM | POA: Diagnosis not present

## 2017-03-15 DIAGNOSIS — R361 Hematospermia: Secondary | ICD-10-CM | POA: Diagnosis not present

## 2017-03-15 LAB — AMB POC URINALYSIS DIP STICK AUTO W/O MICRO
Bilirubin (UA POC): NEGATIVE
Blood (UA POC): NEGATIVE
Glucose (UA POC): NEGATIVE
Ketones (UA POC): NEGATIVE
Leukocyte esterase (UA POC): NEGATIVE
Nitrites (UA POC): NEGATIVE
Protein (UA POC): NEGATIVE
Specific gravity (UA POC): 1.02 (ref 1.001–1.035)
Urobilinogen (UA POC): 1 (ref 0.2–1)
pH (UA POC): 6 (ref 4.6–8.0)

## 2017-03-15 LAB — AMB POC PVR, MEAS,POST-VOID RES,US,NON-IMAGING: PVR: 33 cc

## 2017-03-15 MED ORDER — TAMSULOSIN SR 0.4 MG 24 HR CAP
0.4 mg | ORAL_CAPSULE | Freq: Every day | ORAL | 3 refills | Status: DC
Start: 2017-03-15 — End: 2017-04-24

## 2017-03-15 NOTE — Progress Notes (Signed)
Ricardo Rivers   Mar 03, 1941     ASSESSMENT:    1. BPH with LUTS, specifically weak FOS, improved on Flomax 0.8 mg daily, PVR 12 cc today (04/28/16)  2. History of nephrolithiasis with last stone passage in 2013 and intermittent passage of "gravel"   3. Prostate cancer screening done by PCP  4. Hematospermia     PLAN:  KUB performed today- reviewed with patient ? Punctate bilateral stones- difficult to visualize due to bowel gas pattern  Renal US prior to appointment with Dr. Maple Hudson to further evaluate stone burden  Patient still doesn't wish to perform litholink  Reassured patient hematospermia is benign and should resolve over time.  Continue Flomax 0.8 mg daily- refill provided  Follow up with Dr. Maple Hudson as scheduled.    DISCUSSION:    I have reassured the patient that hematospermia is an innocuous symptom and rarely is ever a sign of severe disease.  No further workup is indicated unless the patient experiences hematuria or new voiding symptoms occur.       Patient's BMI is out of the normal parameters.  Information about BMI was given and patient was advised to follow-up with their PCP for further management.       Chief Complaint   Patient presents with   ??? Kidney Stone     pt has found gravel in his urine   ??? Benign Prostatic Hypertrophy   ??? Blood In Semen     pt saw pink in ejaculate 1 week ago, 2nd day there was a deeper pink, day 3 was red blood.      History of Present Illness:    Ricardo Rivers is a 76 y.o. Caucasian male with a history of BPH and history of nephrolithiasis, followed medically by Dr. Olevia Perches, who presents today w/ c/o hematospermia.    Patient Reports blood with ejaculate over the last couple of weeks- first 2 times was light pink, third was dark red and then it cleared  Reports a pain at the base of his penis and his penis felt swollen for an hour or 2 at this time, now resolved  This last occurred 4-5 days ago   Angelique Blonder any trauma or pain with intercourse/ erections   No testicular pain/ swelling  Angelique Blonder gross hematuria or dysuria  Denies FCNV  Reports frequency/ urgency at baseline that is unchanged  Strong urine stream  On Flomax 0.8 mg daily with benefit      Hx of kidney stones  Reports passing 2 stones in the last year  No renal colic.  No gross hematuria/ abdominal or flank pain  Asymptomatic today  KUB today, 03/15/2017- Punctate bilateral intrarenal calcifications. No ureteral calcifications       Per Dr. Carmon Ginsberg last note: He continues to take Flomax 0.8 mg daily with benefit. He tried to discontinue it in the past, but his symptoms worsened. There were plans to schedule him for a Urodynamics Study, but he cancelled it because his symptoms resolved. He was also supposed to complete a metabolic urine evaluation ([facility]) prior to this visit, but he decided that he didn't want to do it. The patient states that he is being screened for prostate cancer by his PCP, Dr. Renold Don.  Review of Systems  Constitutional: Fever: No  Skin: Rash: No  HEENT: Hearing difficulty: No  Eyes: Blurred vision: No  Cardiovascular: Chest pain: No  Respiratory: Shortness of breath: No  Gastrointestinal: Nausea/vomiting: No  Musculoskeletal: Back pain: Yes  Neurological: Weakness: No  Psychological: Memory loss: No  Comments/additional findings:     Past Medical History:   Diagnosis Date   ??? Adverse effect of anesthesia    ??? Asthma    ??? Back pain    ??? Benign prostatic hyperplasia, presence of lower urinary tract symptoms unspecified    ??? BPH (benign prostatic hyperplasia)    ??? Cataract    ??? Chronic bronchitis (HCC)    ??? Community acquired pneumonia    ??? Cough    ??? Degenerative arthritis of spine    ??? Diplopia    ??? Heart murmur     ??? Heartburn    ??? History of nephrolithiasis    ??? Hypertension    ??? Incomplete bladder emptying    ??? Kidney stones    ??? Lower urinary tract symptoms (LUTS)    ??? Nocturia    ??? PND (post-nasal drip)    ??? Sleep apnea    ??? Stuffy and runny nose    ??? Weak urinary stream    ??? Wheezing      Past Surgical History:   Procedure Laterality Date   ??? HX CATARACT REMOVAL  2015 x 2   ??? HX HERNIA REPAIR  1978, 1985, 12/12   ??? HX KNEE ARTHROSCOPY  2007   ??? HX LAP CHOLECYSTECTOMY  04/14/2011   ??? HX TONSILLECTOMY       Social History     Social History   ??? Marital status: DIVORCED     Spouse name: N/A   ??? Number of children: N/A   ??? Years of education: N/A     Occupational History   ??? Not on file.     Social History Main Topics   ??? Smoking status: Never Smoker   ??? Smokeless tobacco: Never Used   ??? Alcohol use 0.5 oz/week     1 Glasses of wine per week   ??? Drug use: No   ??? Sexual activity: Not on file     Other Topics Concern   ??? Not on file     Social History Narrative     History reviewed. No pertinent family history.  Allergies   Allergen Reactions   ??? Iodinated Contrast- Oral And Iv Dye Hives     Prior to Admission medications    Medication Sig Start Date End Date Taking? Authorizing Provider   tamsulosin (FLOMAX) 0.4 mg capsule Take 2 Caps by mouth daily. Indications: benign prostatic hyperplasia with lower urinary tract sx 03/15/17  Yes Phillip HealAmanda L Tiersa Dayley, NP   ciclopirox (LOPROX) 1 % sham  03/11/16   Historical Provider   clindamycin (CLEOCIN T) 1 % external solution  03/15/16   Historical Provider   oxyCODONE-acetaminophen (PERCOCET 10) 10-325 mg per tablet  04/27/16   Historical Provider   atenolol (TENORMIN) 100 mg tablet  10/16/15   Historical Provider     Physical Exam:    Visit Vitals   ??? BP 138/84   ??? Ht 5\' 5"  (1.651 m)   ??? Wt 204 lb (92.5 kg)   ??? BMI 33.95 kg/m2   Constitutional: WDWN, Pleasant and appropriate affect, No acute distress.    CV:  No peripheral swelling noted  Respiratory: No respiratory distress or  difficulties   Abdomen:  No abdominal masses or tenderness.  No CVA tenderness. No hernias noted.   GU Male (04/28/16):    DRE: Perineum normal to visual inspection, no erythema or irritation, Sphincter with good tone, Rectum with no hemorrhoids, fissures or masses. Prostate is 25 grams, smooth, symmetric, anodular, unchanged.  SCROTUM:  No scrotal rash or lesions noticed.  Normal bilateral testes and epididymis.   PENIS: Urethral meatus normal in location and size. No urethral discharge.  Skin: No jaundice.    Neuro/Psych:  Alert and oriented x 3. Affect appropriate.   Lymphatic:   No enlarged inguinal lymph nodes.      REVIEW OF LABS/ IMAGING    KUB 03/15/2017  Impression:   Punctate bilateral intrarenal calcifications  No ureteral calcifications     Results for orders placed or performed in visit on 03/15/17   AMB POC URINALYSIS DIP STICK AUTO W/O MICRO   Result Value Ref Range    Color (UA POC) Yellow     Clarity (UA POC) Clear     Glucose (UA POC) Negative Negative    Bilirubin (UA POC) Negative Negative    Ketones (UA POC) Negative Negative    Specific gravity (UA POC) 1.020 1.001 - 1.035    Blood (UA POC) Negative Negative    pH (UA POC) 6.0 4.6 - 8.0    Protein (UA POC) Negative Negative    Urobilinogen (UA POC) 1 mg/dL 0.2 - 1    Nitrites (UA POC) Negative Negative    Leukocyte esterase (UA POC) Negative Negative   AMB POC PVR, MEAS,POST-VOID RES,US,NON-IMAGING   Result Value Ref Range    PVR 33 cc     I would like to thank Dr. Darl Householder. Lex for allowing me the opportunity to assist with his care.    Tamiki Kuba L. [place] Dragon AGACNP-BC  APP QI review    Chart reviewed for QI. No isssues seen.   Randa Ngo, MD

## 2017-03-15 NOTE — Progress Notes (Signed)
KUB performed today per A. Carr.

## 2017-03-15 NOTE — Progress Notes (Signed)
2:03 PM    Triage note - established patient     Are there any changes in your symptoms since last seen?  YES  If YES, what change? Gravel in urine per pt    If on any chronic GU meds, do you need a refill?  YES  If YES, what meds? Flomax- last rx said 1 daily; pt has been taking 2 daily for 10-15 years    Any other new issues of concern?  NO  If YES, what issues? none    Symptoms Check:      urgency and frequency     Difficulty voiding or urinating?  NO  Ricardo MarchMoriah Welsh

## 2017-03-23 ENCOUNTER — Encounter

## 2017-03-27 ENCOUNTER — Inpatient Hospital Stay: Payer: MEDICARE | Attending: Facial Plastic Surgery | Primary: Internal Medicine

## 2017-03-31 DIAGNOSIS — L299 Pruritus, unspecified: Secondary | ICD-10-CM | POA: Diagnosis not present

## 2017-03-31 DIAGNOSIS — J329 Chronic sinusitis, unspecified: Secondary | ICD-10-CM | POA: Diagnosis not present

## 2017-03-31 DIAGNOSIS — J349 Unspecified disorder of nose and nasal sinuses: Secondary | ICD-10-CM | POA: Diagnosis not present

## 2017-03-31 DIAGNOSIS — H0520 Unspecified exophthalmos: Secondary | ICD-10-CM | POA: Diagnosis not present

## 2017-04-05 DIAGNOSIS — L218 Other seborrheic dermatitis: Secondary | ICD-10-CM | POA: Diagnosis not present

## 2017-04-05 DIAGNOSIS — L298 Other pruritus: Secondary | ICD-10-CM | POA: Diagnosis not present

## 2017-04-11 DIAGNOSIS — J342 Deviated nasal septum: Secondary | ICD-10-CM | POA: Diagnosis not present

## 2017-04-11 DIAGNOSIS — J329 Chronic sinusitis, unspecified: Secondary | ICD-10-CM | POA: Diagnosis not present

## 2017-04-11 DIAGNOSIS — J343 Hypertrophy of nasal turbinates: Secondary | ICD-10-CM | POA: Diagnosis not present

## 2017-04-11 DIAGNOSIS — G4730 Sleep apnea, unspecified: Secondary | ICD-10-CM | POA: Diagnosis not present

## 2017-04-17 ENCOUNTER — Ambulatory Visit: Admit: 2017-04-17 | Discharge: 2017-04-17 | Payer: MEDICARE | Primary: Internal Medicine

## 2017-04-17 DIAGNOSIS — N200 Calculus of kidney: Secondary | ICD-10-CM | POA: Diagnosis not present

## 2017-04-17 NOTE — Progress Notes (Signed)
Renal ultrasound per office protocol.

## 2017-04-24 ENCOUNTER — Ambulatory Visit: Admit: 2017-04-24 | Discharge: 2017-04-24 | Payer: MEDICARE | Attending: Urology | Primary: Internal Medicine

## 2017-04-24 DIAGNOSIS — N200 Calculus of kidney: Secondary | ICD-10-CM | POA: Diagnosis not present

## 2017-04-24 DIAGNOSIS — N401 Enlarged prostate with lower urinary tract symptoms: Secondary | ICD-10-CM | POA: Diagnosis not present

## 2017-04-24 DIAGNOSIS — N138 Other obstructive and reflux uropathy: Secondary | ICD-10-CM | POA: Diagnosis not present

## 2017-04-24 LAB — PROSTATE SPECIFIC ANTIGEN, TOTAL (PSA): Prostate Specific Ag: 4.89 ng/mL — ABNORMAL HIGH (ref 0.00–4.00)

## 2017-04-24 LAB — AMB POC PVR, MEAS,POST-VOID RES,US,NON-IMAGING: PVR: 81 cc

## 2017-04-24 MED ORDER — TAMSULOSIN SR 0.4 MG 24 HR CAP
0.4 mg | ORAL_CAPSULE | Freq: Every day | ORAL | 3 refills | Status: DC
Start: 2017-04-24 — End: 2017-04-24

## 2017-04-24 MED ORDER — TAMSULOSIN SR 0.4 MG 24 HR CAP
0.4 mg | ORAL_CAPSULE | Freq: Every day | ORAL | 3 refills | Status: DC
Start: 2017-04-24 — End: 2018-05-01

## 2017-04-24 NOTE — Progress Notes (Signed)
Ricardo Rivers presents today for lab draw per Dr. Roxy CedarYoung's order.    Patient will be notified with lab results.    PSA obtained via venipuncture without any difficulty.      Orders Placed This Encounter   ??? PROSTATE SPECIFIC ANTIGEN, TOTAL (PSA)   ??? COLLECTION VENOUS BLOOD,VENIPUNCTURE       Leslie Andreaegina Gattas-Weston

## 2017-04-24 NOTE — Telephone Encounter (Signed)
Pt called and stated he needs a refill for his medication Flomax. Medication sent to pts pharmacy      Glenard HaringLarissia G Stephens

## 2017-04-24 NOTE — Progress Notes (Signed)
Ricardo Rivers  DOB: 09-Jun-1941  Encounter Date:  04/24/2017    Encounter Diagnoses     ICD-10-CM ICD-9-CM   1. Enlarged prostate with urinary obstruction N40.1 600.01    N13.8 599.69   2. BPH with obstruction/lower urinary tract symptoms N40.1 600.01    N13.8 599.69   3. Bilateral nephrolithiasis N20.0 592.0     ASSESSMENT:   1. BPH with LUTS, specifically weak FOS, improved on Flomax 0.8 mg daily. Failed Proscar. Prostate vol 57 grams by Renal US 04/17/2017.     2. Bilateral Nephrolithiasis. Renal US 04/17/2017, bilateral non-obstructing stones measuring 3mm and 4mm.     3. PSA Screening, PSA today 04/24/2017 is 4.89ng/mL.     PLAN:  PSA today reviewed with patient - 4.89ng/mL.   Renal US performed today and reviewed.   Continue Flomax 0.8mg , refills provided.    Discussed UroLift as alternative to medical therapy.   Follow up in 1 year with same day PSA.      Patient's BMI is out of the normal parameters.  Information about BMI was given and patient was advised to follow-up with their PCP for further management.    Chief Complaint   Patient presents with   ??? Benign Prostatic Hypertrophy     Pt here to F/U Pt would like to discuss flomax, Pt had [place] PSa done today   ??? Kidney Stone     Pt here to go over [place] results     HISTORY OF PRESENT ILLNESS:   Ricardo Rivers is a 76 y.o. male who presents today in follow up for BPH with LUTS. Former patient of Dr. Carmon Ginsberg.     Patient reports worsening urinary symptoms since running out of Flomax.   Stable symptoms when on Flomax 0.8mg .   Strong urine stream with Flomax, weaker when stopping Flomax.    Denies gross hematuria, dysuria.   Asymptomatic for urinary infection.      No recent symptoms associated with kidney stones.   Reports passing 2-3 stones per year, last episode 4 months ago.   Typically takes Advil for relief.   Today he denies flank, abdominal or suprapubic pain.   No recent f/c/n/v.     No difficulty with erections.   No PDE5i's.    Complains of retrograde ejaculation associated with Flomax.     Reports chronic back pain for which he receives cortisone shots. Takes Percocet prn for back pain.      Labs/Radiology:  PSA Trend  Component Prostate Specific Ag   Latest Ref Rng & Units 0.00 - 4.00 ng/mL   04/24/2017 4.89   11/04/2014 3.4   12/18/2013 2.7   04/29/2008 1.660   11/29/2007 4.4 (H)     Renal US 04/17/2017  Impression: ??  1. Bilateral non obstructing renal calculi, measuring 3-10mm.   2. Left simple renal cyst.   3. BPH, prostate vol 57 grams.     Results for orders placed or performed in visit on 04/24/17   PROSTATE SPECIFIC ANTIGEN, TOTAL (PSA)   Result Value Ref Range    Prostate Specific Ag 4.89 (H) 0.00 - 4.00 ng/mL   AMB POC PVR, MEAS,POST-VOID RES,US,NON-IMAGING   Result Value Ref Range    PVR 81 cc       AUA Symptom Score 04/24/2017   Over the past month how often have you had the sensation that your bladder was not completely empty after you finished urinating? 2   Over the past month, how often  have had to urinate again less than 2 hours after you last finished urinating? 2   Over the past month, how often have you found you stopped and started again several times when you urinated? 3   Over the past month, how often have you found it difficult to postpone urination? 1   Over the past month, how often have you had a weak urinary stream? 3   Over the past month, how often have you had to push or strain to begin urinating? 0   Over the past month, how many times did you most typically get up to urinate from the time you went to bed at night until the time you got up in the morning? 1   AUA Score 12   If you were to spend the rest of your life with your urinary condition the way it is now, how would you feel about that? Mostly dissatisfied       Previous AUA symptom score  (12)    Past Medical History:   Diagnosis Date   ??? Adverse effect of anesthesia    ??? Asthma    ??? Back pain     ??? Benign prostatic hyperplasia with lower urinary tract symptoms    ??? Benign prostatic hyperplasia, presence of lower urinary tract symptoms unspecified    ??? BPH (benign prostatic hyperplasia)    ??? Cataract    ??? Chronic bronchitis (HCC)    ??? Community acquired pneumonia    ??? Cough    ??? Degenerative arthritis of spine    ??? Diplopia    ??? Heart murmur    ??? Heartburn    ??? Hematospermia    ??? History of nephrolithiasis    ??? Hypertension    ??? Incomplete bladder emptying    ??? Kidney stones    ??? Lower urinary tract symptoms (LUTS)    ??? Nocturia    ??? PND (post-nasal drip)    ??? Sleep apnea    ??? Stuffy and runny nose    ??? Weak urinary stream    ??? Wheezing      Past Surgical History:   Procedure Laterality Date   ??? HX CATARACT REMOVAL  2015 x 2   ??? HX HERNIA REPAIR  1978, 1985, 12/12   ??? HX KNEE ARTHROSCOPY  2007   ??? HX LAP CHOLECYSTECTOMY  04/14/2011   ??? HX OTHER SURGICAL  10/2016    spine inj.   ??? HX TONSILLECTOMY       Family History   Problem Relation Age of Onset   ??? Heart Failure Father    ??? Breast Cancer Sister    ??? Diabetes Mother    ??? Heart Failure Mother      Social History     Social History   ??? Marital status: DIVORCED     Spouse name: N/A   ??? Number of children: N/A   ??? Years of education: N/A     Occupational History   ??? Not on file.     Social History Main Topics   ??? Smoking status: Never Smoker   ??? Smokeless tobacco: Never Used   ??? Alcohol use 0.5 oz/week     1 Glasses of wine per week   ??? Drug use: No   ??? Sexual activity: Not on file     Other Topics Concern   ??? Not on file     Social History Narrative     Allergies   Allergen  Reactions   ??? Iodinated Contrast- Oral And Iv Dye Hives     Current Outpatient Prescriptions   Medication Sig Dispense Refill   ??? tamsulosin (FLOMAX) 0.4 mg capsule Take 2 Caps by mouth daily. Indications: benign prostatic hyperplasia with lower urinary tract sx 180 Cap 3   ??? ciclopirox (LOPROX) 1 % sham      ??? clindamycin (CLEOCIN T) 1 % external solution       ??? oxyCODONE-acetaminophen (PERCOCET 10) 10-325 mg per tablet      ??? atenolol (TENORMIN) 100 mg tablet          Review of Systems  Constitutional: Fever: No  Skin: Rash: No  HEENT: Hearing difficulty: No  Eyes: Blurred vision: No  Cardiovascular: Chest pain: No  Respiratory: Shortness of breath: No  Gastrointestinal: Nausea/vomiting: No  Musculoskeletal: Back pain: No  Neurological: Weakness: No  Psychological: Memory loss: No  Comments/additional findings:     Physical Examination:  Visit Vitals   ??? BP 122/80   ??? Ht 5\' 5"  (1.651 m)   ??? Wt 204 lb (92.5 kg)   ??? BMI 33.95 kg/m2      Constitutional: WDWN, Pleasant and appropriate affect, No acute distress.    Head: Normocephalic and atraumatic.   Eyes: No discharge. No scleral icterus.   Neck: Normal range of motion. Neck supple. No JVD present. No tracheal deviation present.   Pulmonary/Chest: Effort normal. No respiratory distress.   Musc:normal gait and station  Psych: normal affect and mood, well groomed, appropriate answers, A&Ox3  Skin: Skin is warm and dry.   GU Male (04/24/2017):      Scrotum:   normal   Epididymides: nontender, symmetric    Testes:   Nontender, symmetric, no masses   Urethral Meatus:   No stenosis   Penis:  normal   Foreskin:  circumcised   Anus/Perineum:  normal   Spinchter tone:  normal   Rectum:  No masses   Prostate:  40 grams, nontender, no nodules     CC: Maryclare Bean, MD     Quita Skye. Kendon Sedeno, MD  [facility] [place]  [address]   [place], [place] [address]  [phone] (office)      Medical documentation is provided with the assistance of Sabrina A. Delfenthal, medical scribe for Laurier Nancy, MD on 04/24/2017

## 2017-04-25 DIAGNOSIS — Z87442 Personal history of urinary calculi: Secondary | ICD-10-CM | POA: Diagnosis not present

## 2017-04-25 DIAGNOSIS — I10 Essential (primary) hypertension: Secondary | ICD-10-CM | POA: Diagnosis not present

## 2017-04-25 DIAGNOSIS — R6883 Chills (without fever): Secondary | ICD-10-CM | POA: Diagnosis not present

## 2017-04-25 DIAGNOSIS — R109 Unspecified abdominal pain: Secondary | ICD-10-CM | POA: Diagnosis not present

## 2017-04-25 DIAGNOSIS — Z91041 Radiographic dye allergy status: Secondary | ICD-10-CM | POA: Diagnosis not present

## 2017-04-25 DIAGNOSIS — R10814 Left lower quadrant abdominal tenderness: Secondary | ICD-10-CM | POA: Diagnosis not present

## 2017-04-25 DIAGNOSIS — K5732 Diverticulitis of large intestine without perforation or abscess without bleeding: Secondary | ICD-10-CM | POA: Diagnosis not present

## 2017-04-25 DIAGNOSIS — R55 Syncope and collapse: Secondary | ICD-10-CM | POA: Diagnosis not present

## 2017-04-25 DIAGNOSIS — R42 Dizziness and giddiness: Secondary | ICD-10-CM | POA: Diagnosis not present

## 2017-04-25 DIAGNOSIS — Z9049 Acquired absence of other specified parts of digestive tract: Secondary | ICD-10-CM | POA: Diagnosis not present

## 2017-04-25 NOTE — Telephone Encounter (Signed)
Patient called and stated he's been experiencing chills, nausea (no vomiting), diarrhea, and no appetite since yesterday. Patient believes it has something do with  having his blood drawn on yesterday. Advised patient that nurse will not be in until 8:30 and if necessary, go to the ER. Patient can be reached at [phone]  ??         Returned patients call and he informed me he has been admitted to the hospital and diagnosed with diverticulitis.

## 2017-04-25 NOTE — Telephone Encounter (Signed)
Patient called and stated he's been experiencing chills, nausea (no vomiting), diarrhea, and no appetite since yesterday. Patient believes it has something do with  having his blood drawn on yesterday. Advised patient that nurse will not be in until 8:30 and if necessary, go to the ER. Patient can be reached at [phone]

## 2017-04-25 NOTE — Telephone Encounter (Signed)
[facility]. Insurance is denying refill stating it is to soon. I have advised patient that if there is a problem, [facility] will contact us directly.  He expressed understanding.

## 2017-04-27 DIAGNOSIS — K5792 Diverticulitis of intestine, part unspecified, without perforation or abscess without bleeding: Secondary | ICD-10-CM | POA: Diagnosis not present

## 2017-04-27 DIAGNOSIS — I10 Essential (primary) hypertension: Secondary | ICD-10-CM | POA: Diagnosis not present

## 2017-04-27 DIAGNOSIS — R739 Hyperglycemia, unspecified: Secondary | ICD-10-CM | POA: Diagnosis not present

## 2017-04-28 ENCOUNTER — Encounter: Attending: Urology | Primary: Internal Medicine

## 2017-05-01 DIAGNOSIS — G9009 Other idiopathic peripheral autonomic neuropathy: Secondary | ICD-10-CM | POA: Diagnosis not present

## 2017-05-01 DIAGNOSIS — G629 Polyneuropathy, unspecified: Secondary | ICD-10-CM | POA: Diagnosis not present

## 2017-05-01 DIAGNOSIS — J45909 Unspecified asthma, uncomplicated: Secondary | ICD-10-CM | POA: Diagnosis not present

## 2017-05-01 DIAGNOSIS — Z91041 Radiographic dye allergy status: Secondary | ICD-10-CM | POA: Diagnosis not present

## 2017-05-01 DIAGNOSIS — M5126 Other intervertebral disc displacement, lumbar region: Secondary | ICD-10-CM | POA: Diagnosis not present

## 2017-05-01 DIAGNOSIS — Z79899 Other long term (current) drug therapy: Secondary | ICD-10-CM | POA: Diagnosis not present

## 2017-05-01 DIAGNOSIS — M48061 Spinal stenosis, lumbar region without neurogenic claudication: Secondary | ICD-10-CM | POA: Diagnosis not present

## 2017-05-01 DIAGNOSIS — M5416 Radiculopathy, lumbar region: Secondary | ICD-10-CM | POA: Diagnosis not present

## 2017-05-01 DIAGNOSIS — N400 Enlarged prostate without lower urinary tract symptoms: Secondary | ICD-10-CM | POA: Diagnosis not present

## 2017-05-01 DIAGNOSIS — M5116 Intervertebral disc disorders with radiculopathy, lumbar region: Secondary | ICD-10-CM | POA: Diagnosis not present

## 2017-05-01 DIAGNOSIS — I10 Essential (primary) hypertension: Secondary | ICD-10-CM | POA: Diagnosis not present

## 2017-05-03 DIAGNOSIS — Z961 Presence of intraocular lens: Secondary | ICD-10-CM | POA: Diagnosis not present

## 2017-05-15 DIAGNOSIS — L820 Inflamed seborrheic keratosis: Secondary | ICD-10-CM | POA: Diagnosis not present

## 2017-06-02 DIAGNOSIS — J45909 Unspecified asthma, uncomplicated: Secondary | ICD-10-CM | POA: Diagnosis not present

## 2017-06-02 DIAGNOSIS — J309 Allergic rhinitis, unspecified: Secondary | ICD-10-CM | POA: Diagnosis not present

## 2017-06-23 DIAGNOSIS — G4733 Obstructive sleep apnea (adult) (pediatric): Secondary | ICD-10-CM | POA: Diagnosis not present

## 2017-06-23 DIAGNOSIS — Z125 Encounter for screening for malignant neoplasm of prostate: Secondary | ICD-10-CM | POA: Diagnosis not present

## 2017-06-23 DIAGNOSIS — H5340 Unspecified visual field defects: Secondary | ICD-10-CM | POA: Diagnosis not present

## 2017-06-23 DIAGNOSIS — L299 Pruritus, unspecified: Secondary | ICD-10-CM | POA: Diagnosis not present

## 2017-06-23 DIAGNOSIS — R739 Hyperglycemia, unspecified: Secondary | ICD-10-CM | POA: Diagnosis not present

## 2017-06-23 DIAGNOSIS — E559 Vitamin D deficiency, unspecified: Secondary | ICD-10-CM | POA: Diagnosis not present

## 2017-06-23 DIAGNOSIS — E538 Deficiency of other specified B group vitamins: Secondary | ICD-10-CM | POA: Diagnosis not present

## 2017-06-23 DIAGNOSIS — I10 Essential (primary) hypertension: Secondary | ICD-10-CM | POA: Diagnosis not present

## 2017-06-23 DIAGNOSIS — R51 Headache: Secondary | ICD-10-CM | POA: Diagnosis not present

## 2017-06-23 LAB — VITAMIN D, 25 HYDROXY: Vit D 25-Hydroxy: 42.3 ng/mL (ref 30.0–100.0)

## 2017-06-23 LAB — PSA, DIAGNOSTIC (PROSTATE SPECIFIC AG): Prostate Specific Ag: 5.3 ng/mL — AB (ref 0.0–4.0)

## 2017-06-26 DIAGNOSIS — L702 Acne varioliformis: Secondary | ICD-10-CM | POA: Diagnosis not present

## 2017-06-26 DIAGNOSIS — L820 Inflamed seborrheic keratosis: Secondary | ICD-10-CM | POA: Diagnosis not present

## 2017-07-10 ENCOUNTER — Ambulatory Visit: Admit: 2017-07-10 | Discharge: 2017-07-10 | Payer: MEDICARE | Attending: Nurse Practitioner | Primary: Internal Medicine

## 2017-07-10 DIAGNOSIS — R972 Elevated prostate specific antigen [PSA]: Secondary | ICD-10-CM | POA: Diagnosis not present

## 2017-07-10 DIAGNOSIS — N138 Other obstructive and reflux uropathy: Secondary | ICD-10-CM | POA: Diagnosis not present

## 2017-07-10 DIAGNOSIS — N3943 Post-void dribbling: Secondary | ICD-10-CM | POA: Diagnosis not present

## 2017-07-10 DIAGNOSIS — N401 Enlarged prostate with lower urinary tract symptoms: Secondary | ICD-10-CM | POA: Diagnosis not present

## 2017-07-10 LAB — AMB POC URINALYSIS DIP STICK AUTO W/O MICRO
Bilirubin (UA POC): NEGATIVE
Blood (UA POC): NEGATIVE
Glucose (UA POC): NEGATIVE
Ketones (UA POC): NEGATIVE
Leukocyte esterase (UA POC): NEGATIVE
Nitrites (UA POC): NEGATIVE
Protein (UA POC): NEGATIVE
Specific gravity (UA POC): 1.01 (ref 1.001–1.035)
Urobilinogen (UA POC): 0.2 (ref 0.2–1)
pH (UA POC): 5.5 (ref 4.6–8.0)

## 2017-07-10 LAB — AMB POC PVR, MEAS,POST-VOID RES,US,NON-IMAGING: PVR: 0 cc

## 2017-07-10 NOTE — Progress Notes (Signed)
Ricardo Rivers  DOB: May 11, 1941  Encounter Date:  07/10/2017    Encounter Diagnoses     ICD-10-CM ICD-9-CM   1. Elevated PSA R97.20 790.93   2. Enlarged prostate with urinary obstruction N40.1 600.01    N13.8 599.69   3. Post-void dribbling N39.43 788.35     ASSESSMENT:   1. Elevated PSA - recent PSA elevated at 5.3 on 06/23/2017. Concerning for persistent rise. Benign DRE     2. BPH with LUTS - now complains of post void dribbling. Has weak FOS. On Flomax 0.8 mg daily. Failed Proscar.    Prostate vol 57 grams by Renal US 04/17/2017.     3. Bilateral Nephrolithiasis. Renal US 04/17/2017, bilateral non-obstructing stones measuring 3mm and 4mm.     PLAN:  ?? Reviewed PSA trend with patient 06/23/17 - PSA elevated at 5.3   ?? Discussed that at this time we recommend TRUS Biopsy to rule out prostate cancer  ?? Patient not interested in prostate biopsy at this time, he would like to continue surveillance and recheck in the near future.   ?? Continue Flomax 0.8 mg  ?? RTO in 4 months with PSA prior with Dr. Maple Hudson    Prostate Biopsy:  We discussed the risks and benefits of transrectal ultrasound guided prostate biopsy including possible pain, infection/sepsis (estimated risk: 3-5%), prostatitis, bleeding, and inconclusive diagnostic information necessitating close follow up or repeat biopsy. Patient would like to hold off on biopsy at this time.      Patient's BMI is out of the normal parameters.  Information about BMI was given and patient was advised to follow-up with their PCP for further management.    Chief Complaint   Patient presents with   ??? Benign Prostatic Hypertrophy   ??? (LUTS) Lower Urinary Tract Symptoms   ??? Kidney Stone     HISTORY OF PRESENT ILLNESS:   Ricardo Rivers is a 76 y.o. male who presents today in follow up for elevated PSA. Last seen on 04/24/2017 by Dr. Maple Hudson.    Patient's most recent PSA elevated at 5.3 on 06/23/2017 which increased from 4.89 on 04/24/2017 as noted on his PSA below.    Patient denies family history of prostate cancer. Denies history of prostate biopsy.     Patient does complain of some post void dribbling over the last couple months.   Nocturia x1-2. He does note that he only gets 5-6 hours of sleep since he was a teenager.  Denies urinary frequency.   Denies dysuria or gross hematuria.   No UTI symptoms. No fevers, chills, nausea or vomiting.   Patient does note that he ejaculates 1-2 times a day.  No family history of prostate caner.   Denies any vascular issues or history. Not on any anticoagulants.   Patient has a follow up with GI on elevated ammonia levels.     Reports chronic back pain for which he receives cortisone shots. Takes Percocet prn for back pain.    Other Urological History:  No recent symptoms associated with kidney stones.   Reports passing 2-3 stones per year, last episode 4 months ago.   Typically takes Advil for relief.   Today he denies flank, abdominal or suprapubic pain.   No recent f/c/n/v.     Labs/Radiology:  PSA Trend  PSA /TESTOSTERONE - [facility] PSA PSA   Latest Ref Rng & Units 0.00 - 4.00 ng/mL 0.0 - 4.0 ng/mL   06/23/2017  5.3 (A)   04/24/2017 4.89 (H)  11/04/2014  3.4   12/18/2013  2.7     Renal US 04/17/2017  Impression: ??  1. Bilateral non obstructing renal calculi, measuring 3-77mm.   2. Left simple renal cyst.   3. BPH, prostate vol 57 grams.     Results for orders placed or performed in visit on 07/10/17   AMB POC URINALYSIS DIP STICK AUTO W/O MICRO   Result Value Ref Range    Color (UA POC) Yellow     Clarity (UA POC) Clear     Glucose (UA POC) Negative Negative    Bilirubin (UA POC) Negative Negative    Ketones (UA POC) Negative Negative    Specific gravity (UA POC) 1.010 1.001 - 1.035    Blood (UA POC) Negative Negative    pH (UA POC) 5.5 4.6 - 8.0    Protein (UA POC) Negative Negative    Urobilinogen (UA POC) 0.2 mg/dL 0.2 - 1    Nitrites (UA POC) Negative Negative    Leukocyte esterase (UA POC) Negative Negative    AMB POC PVR, MEAS,POST-VOID RES,US,NON-IMAGING   Result Value Ref Range    PVR 0 cc       AUA Symptom Score 04/24/2017   Over the past month how often have you had the sensation that your bladder was not completely empty after you finished urinating? 2   Over the past month, how often have had to urinate again less than 2 hours after you last finished urinating? 2   Over the past month, how often have you found you stopped and started again several times when you urinated? 3   Over the past month, how often have you found it difficult to postpone urination? 1   Over the past month, how often have you had a weak urinary stream? 3   Over the past month, how often have you had to push or strain to begin urinating? 0   Over the past month, how many times did you most typically get up to urinate from the time you went to bed at night until the time you got up in the morning? 1   AUA Score 12   If you were to spend the rest of your life with your urinary condition the way it is now, how would you feel about that? Mostly dissatisfied       Previous AUA symptom score  (12)    Past Medical History:   Diagnosis Date   ??? Adverse effect of anesthesia    ??? Asthma    ??? Back pain    ??? Benign prostatic hyperplasia with lower urinary tract symptoms    ??? Benign prostatic hyperplasia, presence of lower urinary tract symptoms unspecified    ??? Bilateral nephrolithiasis    ??? BPH (benign prostatic hyperplasia)    ??? BPH with obstruction/lower urinary tract symptoms    ??? Cataract    ??? Chronic bronchitis (HCC)    ??? Community acquired pneumonia    ??? Cough    ??? Degenerative arthritis of spine    ??? Diplopia    ??? Enlarged prostate with urinary obstruction    ??? Heart murmur    ??? Heartburn    ??? Hematospermia    ??? History of nephrolithiasis    ??? Hypertension    ??? Incomplete bladder emptying    ??? Kidney stones    ??? Lower urinary tract symptoms (LUTS)    ??? Nocturia    ??? PND (post-nasal drip)    ???  Sleep apnea    ??? Stuffy and runny nose     ??? Weak urinary stream    ??? Wheezing      Past Surgical History:   Procedure Laterality Date   ??? HX CATARACT REMOVAL  2015 x 2   ??? HX HERNIA REPAIR  1978, 1985, 12/12   ??? HX KNEE ARTHROSCOPY  2007   ??? HX LAP CHOLECYSTECTOMY  04/14/2011   ??? HX OTHER SURGICAL  10/2016    spine inj.   ??? HX TONSILLECTOMY       Family History   Problem Relation Age of Onset   ??? Heart Failure Father    ??? Breast Cancer Sister    ??? Diabetes Mother    ??? Heart Failure Mother      Social History     Social History   ??? Marital status: DIVORCED     Spouse name: N/A   ??? Number of children: N/A   ??? Years of education: N/A     Occupational History   ??? Not on file.     Social History Main Topics   ??? Smoking status: Never Smoker   ??? Smokeless tobacco: Never Used   ??? Alcohol use 0.5 oz/week     1 Glasses of wine per week   ??? Drug use: No   ??? Sexual activity: Not on file     Other Topics Concern   ??? Not on file     Social History Narrative     Allergies   Allergen Reactions   ??? Iodinated Contrast- Oral And Iv Dye Hives     Current Outpatient Prescriptions   Medication Sig Dispense Refill   ??? VENTOLIN HFA 90 mcg/actuation inhaler      ??? desonide (TRIDESILON) 0.05 % topical lotion      ??? tamsulosin (FLOMAX) 0.4 mg capsule Take 2 Caps by mouth daily. Indications: benign prostatic hyperplasia with lower urinary tract sx 180 Cap 3   ??? ciclopirox (LOPROX) 1 % sham      ??? clindamycin (CLEOCIN T) 1 % external solution      ??? oxyCODONE-acetaminophen (PERCOCET 10) 10-325 mg per tablet      ??? atenolol (TENORMIN) 100 mg tablet          Review of Systems  Constitutional: Fever: No  Skin: Rash: No  HEENT: Hearing difficulty: No  Eyes: Blurred vision: No  Cardiovascular: Chest pain: No  Respiratory: Shortness of breath: No  Gastrointestinal: Nausea/vomiting: No  Musculoskeletal: Back pain: No  Neurological: Weakness:   Psychological: Memory loss: No  Comments/additional findings:     Visit Vitals   ??? BP 114/70   ??? Ht 5\' 5"  (1.651 m)   ??? Wt 204 lb (92.5 kg)    ??? BMI 33.95 kg/m2     Constitutional: WDWN, Pleasant and appropriate affect, No acute distress.    CV:  No peripheral swelling noted  Respiratory: No respiratory distress or difficulties  Abdomen:  No abdominal masses or tenderness.  No CVA tenderness. No inguinal hernias noted.   GU Male (07/10/2017):    [id number] normal to visual inspection. Sphincter with good tone, Rectum with no masses.  Prostate is medium. Benign to palpation. Anodular and symmetric.  Skin: No cyanosis or jaundice.    Neuro/Psych:  Alert and oriented x 4. Affect appropriate.   Lymphatic:   No enlarged inguinal lymph nodes.      CC: Maryclare Bean, MD     Delena Serve, NP  Documentation provided by Glori Luis, medical scribe for Delena Serve, NP    Moody Bruins FNP-C  [facility]  [address]  [phone]

## 2017-07-11 DIAGNOSIS — K5732 Diverticulitis of large intestine without perforation or abscess without bleeding: Secondary | ICD-10-CM | POA: Diagnosis not present

## 2017-07-11 DIAGNOSIS — E669 Obesity, unspecified: Secondary | ICD-10-CM | POA: Diagnosis not present

## 2017-07-11 DIAGNOSIS — Z86010 Personal history of colonic polyps: Secondary | ICD-10-CM | POA: Diagnosis not present

## 2017-07-11 DIAGNOSIS — R945 Abnormal results of liver function studies: Secondary | ICD-10-CM | POA: Diagnosis not present

## 2017-07-11 DIAGNOSIS — J45998 Other asthma: Secondary | ICD-10-CM | POA: Diagnosis not present

## 2017-07-11 DIAGNOSIS — G4730 Sleep apnea, unspecified: Secondary | ICD-10-CM | POA: Diagnosis not present

## 2017-07-20 DIAGNOSIS — Z23 Encounter for immunization: Secondary | ICD-10-CM | POA: Diagnosis not present

## 2017-07-20 DIAGNOSIS — R51 Headache: Secondary | ICD-10-CM | POA: Diagnosis not present

## 2017-07-21 DIAGNOSIS — R51 Headache: Secondary | ICD-10-CM | POA: Diagnosis not present

## 2017-07-21 DIAGNOSIS — Z0000 Encounter for general adult medical examination without abnormal findings: Secondary | ICD-10-CM | POA: Diagnosis not present

## 2017-07-21 DIAGNOSIS — G4733 Obstructive sleep apnea (adult) (pediatric): Secondary | ICD-10-CM | POA: Diagnosis not present

## 2017-07-21 DIAGNOSIS — R9082 White matter disease, unspecified: Secondary | ICD-10-CM | POA: Diagnosis not present

## 2017-07-21 DIAGNOSIS — I10 Essential (primary) hypertension: Secondary | ICD-10-CM | POA: Diagnosis not present

## 2017-07-21 DIAGNOSIS — E559 Vitamin D deficiency, unspecified: Secondary | ICD-10-CM | POA: Diagnosis not present

## 2017-07-21 DIAGNOSIS — R739 Hyperglycemia, unspecified: Secondary | ICD-10-CM | POA: Diagnosis not present

## 2017-08-08 DIAGNOSIS — R51 Headache: Secondary | ICD-10-CM | POA: Diagnosis not present

## 2017-08-08 DIAGNOSIS — I10 Essential (primary) hypertension: Secondary | ICD-10-CM | POA: Diagnosis not present

## 2017-08-09 DIAGNOSIS — G988 Other disorders of nervous system: Secondary | ICD-10-CM | POA: Diagnosis not present

## 2017-08-09 DIAGNOSIS — N400 Enlarged prostate without lower urinary tract symptoms: Secondary | ICD-10-CM | POA: Diagnosis not present

## 2017-08-09 DIAGNOSIS — K641 Second degree hemorrhoids: Secondary | ICD-10-CM | POA: Diagnosis not present

## 2017-08-09 DIAGNOSIS — G4730 Sleep apnea, unspecified: Secondary | ICD-10-CM | POA: Diagnosis not present

## 2017-08-09 DIAGNOSIS — K5730 Diverticulosis of large intestine without perforation or abscess without bleeding: Secondary | ICD-10-CM | POA: Diagnosis not present

## 2017-08-09 DIAGNOSIS — K5900 Constipation, unspecified: Secondary | ICD-10-CM | POA: Diagnosis not present

## 2017-08-09 DIAGNOSIS — K648 Other hemorrhoids: Secondary | ICD-10-CM | POA: Diagnosis not present

## 2017-08-09 DIAGNOSIS — Z79899 Other long term (current) drug therapy: Secondary | ICD-10-CM | POA: Diagnosis not present

## 2017-08-09 DIAGNOSIS — I10 Essential (primary) hypertension: Secondary | ICD-10-CM | POA: Diagnosis not present

## 2017-08-09 DIAGNOSIS — Z9989 Dependence on other enabling machines and devices: Secondary | ICD-10-CM | POA: Diagnosis not present

## 2017-08-09 DIAGNOSIS — J45909 Unspecified asthma, uncomplicated: Secondary | ICD-10-CM | POA: Diagnosis not present

## 2017-08-09 DIAGNOSIS — Z91041 Radiographic dye allergy status: Secondary | ICD-10-CM | POA: Diagnosis not present

## 2017-08-24 DIAGNOSIS — L820 Inflamed seborrheic keratosis: Secondary | ICD-10-CM | POA: Diagnosis not present

## 2017-08-24 DIAGNOSIS — L702 Acne varioliformis: Secondary | ICD-10-CM | POA: Diagnosis not present

## 2017-09-27 DIAGNOSIS — L570 Actinic keratosis: Secondary | ICD-10-CM | POA: Diagnosis not present

## 2017-09-29 DIAGNOSIS — L309 Dermatitis, unspecified: Secondary | ICD-10-CM | POA: Diagnosis not present

## 2017-10-30 ENCOUNTER — Institutional Professional Consult (permissible substitution): Admit: 2017-10-30 | Primary: Internal Medicine

## 2017-10-30 DIAGNOSIS — R972 Elevated prostate specific antigen [PSA]: Secondary | ICD-10-CM | POA: Diagnosis not present

## 2017-10-30 NOTE — Progress Notes (Signed)
Rudean Curtobert T Gangemi presents today for lab draw per Dr. Roxy CedarYoung's order.   Dr. Wyvonnia DuskyMiles-Thomas was present in the clinic as incident to.     PSA obtained via venipuncture without any difficulty.    Patient will be notified with lab results.       Orders Placed This Encounter   ??? PROSTATE SPECIFIC ANTIGEN, TOTAL (PSA)   ??? COLLECTION VENOUS BLOOD,VENIPUNCTURE       Kennith MaesLeslie A Jvion Turgeon

## 2017-10-31 LAB — PROSTATE SPECIFIC ANTIGEN, TOTAL (PSA): Prostate Specific Ag: 4.99 ng/mL — ABNORMAL HIGH (ref 0.00–4.00)

## 2017-11-06 ENCOUNTER — Ambulatory Visit: Admit: 2017-11-06 | Discharge: 2017-11-06 | Attending: Urology | Primary: Internal Medicine

## 2017-11-06 DIAGNOSIS — N401 Enlarged prostate with lower urinary tract symptoms: Secondary | ICD-10-CM | POA: Diagnosis not present

## 2017-11-06 DIAGNOSIS — R972 Elevated prostate specific antigen [PSA]: Secondary | ICD-10-CM | POA: Diagnosis not present

## 2017-11-06 DIAGNOSIS — N138 Other obstructive and reflux uropathy: Secondary | ICD-10-CM | POA: Diagnosis not present

## 2017-11-06 LAB — AMB POC URINALYSIS DIP STICK AUTO W/O MICRO
Bilirubin (UA POC): NEGATIVE
Blood (UA POC): NEGATIVE
Glucose (UA POC): NEGATIVE
Ketones (UA POC): NEGATIVE
Leukocyte esterase (UA POC): NEGATIVE
Nitrites (UA POC): NEGATIVE
Protein (UA POC): NEGATIVE
Specific gravity (UA POC): 1.015 (ref 1.001–1.035)
Urobilinogen (UA POC): 1 (ref 0.2–1)
pH (UA POC): 5.5 (ref 4.6–8.0)

## 2017-11-06 LAB — AMB POC PVR, MEAS,POST-VOID RES,US,NON-IMAGING: PVR: 0 cc

## 2017-11-06 NOTE — Progress Notes (Signed)
Ricardo Rivers  DOB: 1941-07-19  Encounter Date:  11/06/2017    Encounter Diagnoses     ICD-10-CM ICD-9-CM   1. Elevated PSA R97.20 790.93   2. BPH with obstruction/lower urinary tract symptoms N40.1 600.01    N13.8 599.69     ASSESSMENT:   1. Elevated PSA. No prior bx. Most recently 4.99 ng/mL on 10/30/17    2. BPH with LUTS -  On Flomax 0.8 mg daily. Failed Proscar. Prostate vol 57 grams by Renal US 04/17/2017.     3. Bilateral Nephrolithiasis. Renal US 04/17/2017, bilateral non-obstructing stones measuring 3mm and 4mm.     PLAN:  PSA 10/30/17 reviewed - 4.99 ng/mL  PSA is stable, do not recommend bx in 77 yo male. Continue annual observation.  Continue Flomax 0.8 mg.  Follow up in 1 year with SD PSA and for DRE.     Patient's BMI is out of the normal parameters.  Information about BMI was given and patient was advised to follow-up with their PCP for further management.    Chief Complaint   Patient presents with   ??? Elevated PSA   ??? Benign Prostatic Hypertrophy     HISTORY OF PRESENT ILLNESS:   Ricardo Rivers is a 77 y.o. male who presents today in follow up for elevated PSA.   Patient denies family history of prostate cancer. Denies history of prostate biopsy.     Patient continues Flomax.  Nocturia x1, notes drinking one glass of water prior to bed.   Denies urinary frequency.   Fair stream.   Denies dysuria or gross hematuria.   Asymptomatic today for infection.    Chronic back pain for which he receives cortisone shots. Takes Percocet prn for back pain.    Prior history of stones.  Denies flank, abdominal or suprapubic pain.   No recent f/c/n/v.     Labs/Radiology:  PSA Trend  Component PSA   Latest Ref Rng & Units 0.0 - 4.0 ng/mL   10/30/2017 4.99   06/23/2017 5.3 (A)   04/24/2017 4.89   11/04/2014 3.4   12/18/2013 2.7     Renal US 04/17/2017  Impression: ??  1. Bilateral non obstructing renal calculi, measuring 3-98mm.   2. Left simple renal cyst.   3. BPH, prostate vol 57 grams.      Results for orders placed or performed in visit on 11/06/17   AMB POC URINALYSIS DIP STICK AUTO W/O MICRO   Result Value Ref Range    Color (UA POC) Yellow     Clarity (UA POC) Clear     Glucose (UA POC) Negative Negative    Bilirubin (UA POC) Negative Negative    Ketones (UA POC) Negative Negative    Specific gravity (UA POC) 1.015 1.001 - 1.035    Blood (UA POC) Negative Negative    pH (UA POC) 5.5 4.6 - 8.0    Protein (UA POC) Negative Negative    Urobilinogen (UA POC) 1 mg/dL 0.2 - 1    Nitrites (UA POC) Negative Negative    Leukocyte esterase (UA POC) Negative Negative       AUA Symptom Score 11/06/2017   Over the past month how often have you had the sensation that your bladder was not completely empty after you finished urinating? 1   Over the past month, how often have had to urinate again less than 2 hours after you last finished urinating? 1   Over the past month, how often have you  found you stopped and started again several times when you urinated? 0   Over the past month, how often have you found it difficult to postpone urination? 0   Over the past month, how often have you had a weak urinary stream? 0   Over the past month, how often have you had to push or strain to begin urinating? 0   Over the past month, how many times did you most typically get up to urinate from the time you went to bed at night until the time you got up in the morning? 1   AUA Score 3   If you were to spend the rest of your life with your urinary condition the way it is now, how would you feel about that? Mostly satisfied       Previous AUA symptom score  (3) 12    Past Medical History:   Diagnosis Date   ??? Acne necrotica    ??? Adverse effect of anesthesia     sometimes hard to wake up    ??? Asthma     inhaler prn    ??? Back pain    ??? Benign prostatic hyperplasia with lower urinary tract symptoms    ??? Benign prostatic hyperplasia, presence of lower urinary tract symptoms unspecified    ??? Bilateral nephrolithiasis     ??? BPH (benign prostatic hyperplasia)     hypertrophy   ??? BPH with obstruction/lower urinary tract symptoms    ??? Burning with urination    ??? Cataract     left eye   ??? Chronic bronchitis (HCC)    ??? Community acquired pneumonia    ??? Cough    ??? Degenerative arthritis of spine    ??? Diplopia     identified:06/14/2007   ??? Disturbances of vision, late effect of cerebrovascular disease     identified:06/14/2007   ??? Elevated PSA    ??? Enlarged prostate with urinary obstruction    ??? Gallstone pancreatitis    ??? Heart murmur     [facility]    ??? Heartburn    ??? Hematospermia    ??? History of nephrolithiasis    ??? History of renal calculi    ??? Hypertension     control. with med   ??? Incomplete bladder emptying    ??? Intertrigo    ??? Kidney stones     abute 50 stones/passed them/hist. of    ??? Leg pain    ??? Leukocytes in urine    ??? Lower urinary tract symptoms (LUTS)    ??? Microscopic hematuria    ??? Nocturia    ??? 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 Penile irritation    ??? PND (post-nasal drip)    ??? Post-void dribbling    ??? Recurrent HSV (herpes simplex virus)    ??? Right flank discomfort    ??? Sleep apnea     obs.    ??? Slowing of urinary stream    ??? Stuffy nose    ??? Swallowing difficulty     duet to recent cold, feeling better   ??? Umbilical hernia    ??? Urinary frequency    ??? Urinary hesitancy    ??? Urinary urgency    ??? Weak urinary stream    ??? Wheezing      Past Surgical History:   Procedure Laterality Date   ??? HX CATARACT REMOVAL Bilateral 2015    x2   ???  HX COLONOSCOPY  07/2017   ??? HX HERNIA REPAIR  1978, 1985, 12/12    bilat. inguinal/umbil.    ??? HX KNEE ARTHROSCOPY Left 2007   ??? HX LAP CHOLECYSTECTOMY  04/14/2011   ??? HX OTHER SURGICAL  10/2016    spine inj.   ??? HX TONSILLECTOMY       Family History   Problem Relation Age of Onset   ??? Heart Failure Father    ??? Breast Cancer Sister    ??? Diabetes Mother    ??? Heart Failure Mother      Social History     Socioeconomic History   ??? Marital status: DIVORCED      Spouse name: Not on file   ??? Number of children: Not on file   ??? Years of education: Not on file   ??? Highest education level: Not on file   Social Needs   ??? Financial resource strain: Not on file   ??? Food insecurity - worry: Not on file   ??? Food insecurity - inability: Not on file   ??? Transportation needs - medical: Not on file   ??? Transportation needs - non-medical: Not on file   Occupational History   ??? Not on file   Tobacco Use   ??? Smoking status: Never Smoker   ??? Smokeless tobacco: Never Used   Substance and Sexual Activity   ??? Alcohol use: Yes     Alcohol/week: 0.5 oz     Types: 1 Glasses of wine per week     Drinks per session: 1 or 2   ??? Drug use: No   ??? Sexual activity: Not on file   Other Topics Concern   ??? Not on file   Social History Narrative   ??? Not on file     Allergies   Allergen Reactions   ??? Iodinated Contrast- Oral And Iv Dye Hives     Current Outpatient Medications   Medication Sig Dispense Refill   ??? clindamycin (CLEOCIN T) 1 % external solution Use 1 Application to affected area 2 Times Daily As Needed (scalp).     ??? desonide (TRIDESILON) 0.05 % topical lotion Use 1 Application to affected area 2 Times Daily As Needed. For face/scalp  rash     ??? clotrimazole-betamethasone (LOTRISONE) topical cream Use 1 Application to affected area 2 Times Daily As Needed.     ??? ketoconazole (NIZORAL) 2 % shampoo USE EVERYDAY AS NEEDED     ??? tamsulosin (FLOMAX) 0.4 mg capsule Take 2 Caps by mouth daily. Indications: benign prostatic hyperplasia with lower urinary tract sx 180 Cap 3   ??? atenolol (TENORMIN) 100 mg tablet      ??? VENTOLIN HFA 90 mcg/actuation inhaler      ??? oxyCODONE-acetaminophen (PERCOCET 10) 10-325 mg per tablet          Review of Systems  Constitutional: Fever: No  Skin: Rash: No  HEENT: Hearing difficulty: No  Eyes: Blurred vision: No  Cardiovascular: Chest pain: No  Respiratory: Shortness of breath: No  Gastrointestinal: Nausea/vomiting: No  Musculoskeletal: Back pain: No   Neurological: Weakness: No  Psychological: Memory loss: No  Comments/additional findings:     Physical Examination:  Visit Vitals  BP 100/70   Ht 5\' 5"  (1.651 m)   Wt 200 lb (90.7 kg)   BMI 33.28 kg/m??      Constitutional: WDWN, Pleasant and appropriate affect, No acute distress.    Head: Normocephalic and atraumatic.   Eyes:  No discharge. No scleral icterus.   Neck: Normal range of motion. Neck supple. No JVD present. No tracheal deviation present.   Pulmonary/Chest: Effort normal. No respiratory distress.   Musc:normal gait and station  Psych: normal affect and mood, well groomed, appropriate answers, A&Ox3  Skin: Skin is warm and dry.   Last DRE 06/2017.       CC: Maryclare Bean, MD     Laurier Nancy, MD    Documentation provided by Morrie Sheldon , medical scribe for Laurier Nancy, MD

## 2017-11-06 NOTE — Progress Notes (Deleted)
Ricardo Rivers  DOB: Jun 01, 1941  Encounter Date:  11/06/2017    Encounter Diagnoses     ICD-10-CM ICD-9-CM   1. Elevated PSA R97.20 790.93     ASSESSMENT:   1. Elevated PSA. No prior bx. Most recently 4.99 ng/mL on 10/30/17    2. BPH with LUTS -  On Flomax 0.8 mg daily. Failed Proscar. Prostate vol 57 grams by Renal US 04/17/2017.     3. Bilateral Nephrolithiasis. Renal US 04/17/2017, bilateral non-obstructing stones measuring 3mm and 4mm.     PLAN:  PSA 10/30/17 reviewed - 4.99 ng/mL  PSA is stable, do not recommend bx in 77 yo male. Continue annual observation.  Continue Flomax 0.8 mg.  Follow up in 1 year with SD PSA.     Patient's BMI is out of the normal parameters.  Information about BMI was given and patient was advised to follow-up with their PCP for further management.    No chief complaint on file.    HISTORY OF PRESENT ILLNESS:   Ricardo Rivers is a 77 y.o. male who presents today in follow up for elevated PSA.   Patient denies family history of prostate cancer. Denies history of prostate biopsy.     Patient continues Flomax.  Nocturia x1, notes drinking one glass of water prior to bed.   Denies urinary frequency.   Fair stream.   Denies dysuria or gross hematuria.   Asymptomatic today for infection.    Chronic back pain for which he receives cortisone shots. Takes Percocet prn for back pain.    Prior history of stones.  Denies flank, abdominal or suprapubic pain.   No recent f/c/n/v.     Labs/Radiology:  PSA Trend  Component PSA   Latest Ref Rng & Units 0.0 - 4.0 ng/mL   10/30/2017 4.99   06/23/2017 5.3 (A)   04/24/2017 4.89   11/04/2014 3.4   12/18/2013 2.7     Renal US 04/17/2017  Impression: ??  1. Bilateral non obstructing renal calculi, measuring 3-884mm.   2. Left simple renal cyst.   3. BPH, prostate vol 57 grams.     Results for orders placed or performed in visit on 11/06/17   AMB POC URINALYSIS DIP STICK AUTO W/O MICRO   Result Value Ref Range    Color (UA POC) Yellow     Clarity (UA POC) Clear      Glucose (UA POC) Negative Negative    Bilirubin (UA POC) Negative Negative    Ketones (UA POC) Negative Negative    Specific gravity (UA POC) 1.015 1.001 - 1.035    Blood (UA POC) Negative Negative    pH (UA POC) 5.5 4.6 - 8.0    Protein (UA POC) Negative Negative    Urobilinogen (UA POC) 1 mg/dL 0.2 - 1    Nitrites (UA POC) Negative Negative    Leukocyte esterase (UA POC) Negative Negative       AUA Symptom Score 11/06/2017   Over the past month how often have you had the sensation that your bladder was not completely empty after you finished urinating? 1   Over the past month, how often have had to urinate again less than 2 hours after you last finished urinating? 1   Over the past month, how often have you found you stopped and started again several times when you urinated? 0   Over the past month, how often have you found it difficult to postpone urination? 0   Over the  past month, how often have you had a weak urinary stream? 0   Over the past month, how often have you had to push or strain to begin urinating? 0   Over the past month, how many times did you most typically get up to urinate from the time you went to bed at night until the time you got up in the morning? 1   AUA Score 3   If you were to spend the rest of your life with your urinary condition the way it is now, how would you feel about that? Mostly satisfied       Previous AUA symptom score  (12)    Past Medical History:   Diagnosis Date   ??? Acne necrotica    ??? Adverse effect of anesthesia     sometimes hard to wake up    ??? Asthma     inhaler prn    ??? Back pain    ??? Benign prostatic hyperplasia with lower urinary tract symptoms    ??? Benign prostatic hyperplasia, presence of lower urinary tract symptoms unspecified    ??? Bilateral nephrolithiasis    ??? BPH (benign prostatic hyperplasia)     hypertrophy   ??? BPH with obstruction/lower urinary tract symptoms    ??? Burning with urination    ??? Cataract     left eye   ??? Chronic bronchitis (HCC)     ??? Community acquired pneumonia    ??? Cough    ??? Degenerative arthritis of spine    ??? Diplopia     identified:06/14/2007   ??? Disturbances of vision, late effect of cerebrovascular disease     identified:06/14/2007   ??? Elevated PSA    ??? Enlarged prostate with urinary obstruction    ??? Gallstone pancreatitis    ??? Heart murmur     [facility]/12 [facility]    ??? Heartburn    ??? Hematospermia    ??? History of nephrolithiasis    ??? History of renal calculi    ??? Hypertension     control. with med   ??? Incomplete bladder emptying    ??? Intertrigo    ??? Kidney stones     abute 50 stones/passed them/hist. of    ??? Leg pain    ??? Leukocytes in urine    ??? Lower urinary tract symptoms (LUTS)    ??? Microscopic hematuria    ??? Nocturia    ??? 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 Penile irritation    ??? PND (post-nasal drip)    ??? Post-void dribbling    ??? Recurrent HSV (herpes simplex virus)    ??? Right flank discomfort    ??? Sleep apnea     obs.    ??? Slowing of urinary stream    ??? Stuffy nose    ??? Swallowing difficulty     duet to recent cold, feeling better   ??? Umbilical hernia    ??? Urinary frequency    ??? Urinary hesitancy    ??? Urinary urgency    ??? Weak urinary stream    ??? Wheezing      Past Surgical History:   Procedure Laterality Date   ??? HX CATARACT REMOVAL Bilateral 2015    x2   ??? HX COLONOSCOPY  07/2017   ??? HX HERNIA REPAIR  1978, 1985, 12/12    bilat. inguinal/umbil.    ??? HX KNEE ARTHROSCOPY Left 2007   ??? HX LAP  CHOLECYSTECTOMY  04/14/2011   ??? HX OTHER SURGICAL  10/2016    spine inj.   ??? HX TONSILLECTOMY       Family History   Problem Relation Age of Onset   ??? Heart Failure Father    ??? Breast Cancer Sister    ??? Diabetes Mother    ??? Heart Failure Mother      Social History     Socioeconomic History   ??? Marital status: DIVORCED     Spouse name: Not on file   ??? Number of children: Not on file   ??? Years of education: Not on file   ??? Highest education level: Not on file   Social Needs   ??? Financial resource strain: Not on file    ??? Food insecurity - worry: Not on file   ??? Food insecurity - inability: Not on file   ??? Transportation needs - medical: Not on file   ??? Transportation needs - non-medical: Not on file   Occupational History   ??? Not on file   Tobacco Use   ??? Smoking status: Never Smoker   ??? Smokeless tobacco: Never Used   Substance and Sexual Activity   ??? Alcohol use: Yes     Alcohol/week: 0.5 oz     Types: 1 Glasses of wine per week     Drinks per session: 1 or 2   ??? Drug use: No   ??? Sexual activity: Not on file   Other Topics Concern   ??? Not on file   Social History Narrative   ??? Not on file     Allergies   Allergen Reactions   ??? Iodinated Contrast- Oral And Iv Dye Hives     Current Outpatient Medications   Medication Sig Dispense Refill   ??? VENTOLIN HFA 90 mcg/actuation inhaler      ??? desonide (TRIDESILON) 0.05 % topical lotion      ??? tamsulosin (FLOMAX) 0.4 mg capsule Take 2 Caps by mouth daily. Indications: benign prostatic hyperplasia with lower urinary tract sx 180 Cap 3   ??? clindamycin (CLEOCIN T) 1 % external solution      ??? oxyCODONE-acetaminophen (PERCOCET 10) 10-325 mg per tablet      ??? atenolol (TENORMIN) 100 mg tablet          Review of Systems  Constitutional: Fever: No  Skin: Rash: No  HEENT: Hearing difficulty: No  Eyes: Blurred vision: No  Cardiovascular: Chest pain: No  Respiratory: Shortness of breath: No  Gastrointestinal: Nausea/vomiting: No  Musculoskeletal: Back pain: No  Neurological: Weakness: No  Psychological: Memory loss: No  Comments/additional findings:     Visit Vitals  Ht 5\' 5"  (1.651 m)   Wt 200 lb (90.7 kg)   BMI 33.28 kg/m??     Constitutional: WDWN, Pleasant and appropriate affect, No acute distress.    CV:  No peripheral swelling noted  Respiratory: No respiratory distress or difficulties  Abdomen:  No abdominal masses or tenderness.  No CVA tenderness. No inguinal hernias noted.   GU Male (07/10/2017):    [id number] normal to visual inspection. Sphincter with good tone, Rectum  with no masses.  Prostate is medium. Benign to palpation. Anodular and symmetric.  Skin: No cyanosis or jaundice.    Neuro/Psych:  Alert and oriented x 4. Affect appropriate.   Lymphatic:   No enlarged inguinal lymph nodes.      CC: Maryclare Bean, MD     Morrie Sheldon  Documentation provided by Glori Luis, medical scribe for Garfield Cornea FNP-C  [facility]  [address]  [phone]

## 2017-11-06 NOTE — Addendum Note (Signed)
Addended byBenetta Spar: LONG, SHANIQUA on: 11/06/2017 11:09 AM     Modules accepted: Orders

## 2017-11-14 DIAGNOSIS — H1132 Conjunctival hemorrhage, left eye: Secondary | ICD-10-CM | POA: Diagnosis not present

## 2017-11-21 DIAGNOSIS — L739 Follicular disorder, unspecified: Secondary | ICD-10-CM | POA: Diagnosis not present

## 2017-11-21 DIAGNOSIS — L299 Pruritus, unspecified: Secondary | ICD-10-CM | POA: Diagnosis not present

## 2017-12-03 DIAGNOSIS — J069 Acute upper respiratory infection, unspecified: Secondary | ICD-10-CM | POA: Diagnosis not present

## 2017-12-03 DIAGNOSIS — J029 Acute pharyngitis, unspecified: Secondary | ICD-10-CM | POA: Diagnosis not present

## 2017-12-03 DIAGNOSIS — H6121 Impacted cerumen, right ear: Secondary | ICD-10-CM | POA: Diagnosis not present

## 2017-12-03 DIAGNOSIS — J020 Streptococcal pharyngitis: Secondary | ICD-10-CM | POA: Diagnosis not present

## 2017-12-22 DIAGNOSIS — L702 Acne varioliformis: Secondary | ICD-10-CM | POA: Diagnosis not present

## 2018-01-22 DIAGNOSIS — J069 Acute upper respiratory infection, unspecified: Secondary | ICD-10-CM | POA: Diagnosis not present

## 2018-01-22 DIAGNOSIS — B9789 Other viral agents as the cause of diseases classified elsewhere: Secondary | ICD-10-CM | POA: Diagnosis not present

## 2018-01-24 DIAGNOSIS — J069 Acute upper respiratory infection, unspecified: Secondary | ICD-10-CM | POA: Diagnosis not present

## 2018-01-24 DIAGNOSIS — J45909 Unspecified asthma, uncomplicated: Secondary | ICD-10-CM | POA: Diagnosis not present

## 2018-03-28 DIAGNOSIS — L219 Seborrheic dermatitis, unspecified: Secondary | ICD-10-CM | POA: Diagnosis not present

## 2018-04-17 DIAGNOSIS — H5712 Ocular pain, left eye: Secondary | ICD-10-CM | POA: Diagnosis not present

## 2018-04-18 DIAGNOSIS — H5712 Ocular pain, left eye: Secondary | ICD-10-CM | POA: Diagnosis not present

## 2018-04-27 ENCOUNTER — Encounter: Attending: Urology | Primary: Internal Medicine

## 2018-05-01 MED ORDER — TAMSULOSIN SR 0.4 MG 24 HR CAP
0.4 mg | ORAL_CAPSULE | ORAL | 2 refills | Status: AC
Start: 2018-05-01 — End: ?

## 2018-05-01 NOTE — Telephone Encounter (Signed)
Pharmacy is requesting a refill for tamsulosin.    Pt is being seen every 1 years,was last seen on 11/06/17.     Per last office note patient is to continue taking the above medication and has a follow-up appointment scheduled for 11/09/18.    Requested medication has been sent to the requesting pharmacy.

## 2018-06-09 ENCOUNTER — Encounter (HOSPITAL_COMMUNITY): Payer: Self-pay | Admitting: Emergency Medicine

## 2018-06-09 ENCOUNTER — Other Ambulatory Visit: Payer: Self-pay

## 2018-06-09 ENCOUNTER — Emergency Department (HOSPITAL_COMMUNITY)
Admission: EM | Admit: 2018-06-09 | Discharge: 2018-06-09 | Disposition: A | Discharge status: Home / Self Care | Payer: Medicare Other | Attending: Emergency Medicine | Admitting: Emergency Medicine

## 2018-06-09 ENCOUNTER — Emergency Department (HOSPITAL_COMMUNITY): Payer: Medicare Other

## 2018-06-09 DIAGNOSIS — Z79899 Other long term (current) drug therapy: Secondary | ICD-10-CM | POA: Diagnosis not present

## 2018-06-09 DIAGNOSIS — M1990 Unspecified osteoarthritis, unspecified site: Secondary | ICD-10-CM | POA: Insufficient documentation

## 2018-06-09 DIAGNOSIS — I10 Essential (primary) hypertension: Secondary | ICD-10-CM | POA: Insufficient documentation

## 2018-06-09 DIAGNOSIS — R1030 Lower abdominal pain, unspecified: Secondary | ICD-10-CM | POA: Diagnosis not present

## 2018-06-09 DIAGNOSIS — M1612 Unilateral primary osteoarthritis, left hip: Secondary | ICD-10-CM | POA: Diagnosis not present

## 2018-06-09 DIAGNOSIS — M25562 Pain in left knee: Secondary | ICD-10-CM

## 2018-06-09 DIAGNOSIS — M150 Primary generalized (osteo)arthritis: Secondary | ICD-10-CM

## 2018-06-09 DIAGNOSIS — M1712 Unilateral primary osteoarthritis, left knee: Secondary | ICD-10-CM | POA: Diagnosis not present

## 2018-06-09 DIAGNOSIS — M159 Polyosteoarthritis, unspecified: Secondary | ICD-10-CM

## 2018-06-09 DIAGNOSIS — S76912A Strain of unspecified muscles, fascia and tendons at thigh level, left thigh, initial encounter: Secondary | ICD-10-CM | POA: Diagnosis not present

## 2018-06-09 DIAGNOSIS — S76212A Strain of adductor muscle, fascia and tendon of left thigh, initial encounter: Secondary | ICD-10-CM

## 2018-06-09 DIAGNOSIS — M8949 Other hypertrophic osteoarthropathy, multiple sites: Secondary | ICD-10-CM

## 2018-06-09 HISTORY — DX: Essential (primary) hypertension: I10

## 2018-06-09 HISTORY — DX: Benign prostatic hyperplasia without lower urinary tract symptoms: N40.0

## 2018-06-09 MED ORDER — ACETAMINOPHEN 500 MG PO TABS
1000.0000 mg | ORAL_TABLET | Freq: Once | ORAL | Status: AC
  Administered 2018-06-09: 1000 mg via ORAL
  Filled 2018-06-09: qty 2

## 2018-06-09 NOTE — ED Triage Notes (Signed)
Pt presents to ED for assessment of left leg pain.  Patient states he moved last week and did all of the packing and moving himself.  States he had aches and pains all over his body for a few days, but everything recovered except his leg.  States he has left groin/"pelvis" pain that radiates down the left leg "into the muscle tissue".  Denies specific known injury.

## 2018-06-09 NOTE — ED Provider Notes (Signed)
[place] EMERGENCY DEPARTMENT Provider Note   CSN: [id number] Arrival date & time: 06/09/18  1130     History   Chief Complaint Chief Complaint  Patient presents with  . Leg Pain    HPI Quentavious Rittenhouse is a 77 y.o. male with a history of BPH and HTN who presents to the emergency department with a chief complaint of left leg pain.  The patient reports that he moved into the bulk of lifting and carrying boxes on 05/28/2018.  He reports that he is typically sedentary, and this was much more activity than he normally participates then.  He reports that initially after the move that he had diffuse pain to multiple joints and muscles that has all resolved except for some left leg pain.  He reports the pain begins in his left knee and radiates to the left groin.  Pain is worse with ambulation, flexion of the hip, and weightbearing.  He reports that he started to have a limp when walking due to the pain.  He denies fever, chills, weakness, numbness, back pain, dysuria, hematuria, testicular or penile pain or swelling, right lower extremity pain.  No history of gout or septic joint.  He reports that he has been treating symptoms with Tylenol and Advil with moderate improvement.  He is scheduled for his first appointment with the PCP within the next month.  The history is provided by the patient. No language interpreter was used.    Past Medical History:  Diagnosis Date  . BPH (benign prostatic hyperplasia)   . Hypertension     There are no active problems to display for this patient.   Past Surgical History:  Procedure Laterality Date  . CHOLECYSTECTOMY    . HERNIA REPAIR          Home Medications    Prior to Admission medications   Medication Sig Start Date End Date Taking? Authorizing Provider  acetaminophen (TYLENOL) 325 MG tablet Take 650 mg by mouth every 6 (six) hours as needed for mild pain or headache.   Yes [provider]  atenolol  (TENORMIN) 100 MG tablet Take 100 mg by mouth daily. 04/30/18  Yes [provider]  clotrimazole-betamethasone (LOTRISONE) cream Apply 1 application topically daily as needed for itching. 05/08/18  Yes [provider]  ibuprofen (ADVIL,MOTRIN) 200 MG tablet Take 400 mg by mouth every 6 (six) hours as needed for fever, headache or mild pain.   Yes [provider]  ketoconazole (NIZORAL) 2 % shampoo Apply 1 application topically daily as needed for irritation.  03/29/18  Yes [provider]  tamsulosin (FLOMAX) 0.4 MG CAPS capsule Take 0.4 mg by mouth daily. 05/01/18  Yes [provider]    Family History History reviewed. No pertinent family history.  Social History Social History   Tobacco Use  . Smoking status: Never Smoker  . Smokeless tobacco: Never Used  Substance Use Topics  . Alcohol use: Not Currently  . Drug use: Never     Allergies   Ivp dye [iodinated diagnostic agents]   Review of Systems Review of Systems  Constitutional: Negative for appetite change, chills and fever.  HENT: Negative for congestion and sore throat.   Respiratory: Negative for shortness of breath.   Cardiovascular: Negative for chest pain.  Gastrointestinal: Negative for abdominal pain, diarrhea, nausea and vomiting.  Genitourinary: Negative for decreased urine volume, discharge, dysuria, flank pain, penile pain, penile swelling, scrotal swelling and urgency.  Musculoskeletal: Positive for arthralgias,  gait problem and myalgias. Negative for back pain, joint swelling, neck pain and neck stiffness.  Skin: Negative for rash.  Allergic/Immunologic: Negative for immunocompromised state.  Neurological: Negative for dizziness, weakness, numbness and headaches.  Psychiatric/Behavioral: Negative for confusion.   Physical Exam Updated Vital Signs BP (!) 144/66   Pulse (!) 52   Temp 98.2 F (36.8 C) (Oral)   Resp 16   SpO2 97%   Physical Exam    Constitutional: He appears well-developed.  HENT:  Head: Normocephalic.  Eyes: Conjunctivae are normal.  Neck: Neck supple.  Cardiovascular: Normal rate, regular rhythm, normal heart sounds and intact distal pulses. Exam reveals no gallop and no friction rub.  No murmur heard. Pulmonary/Chest: Effort normal. No stridor. No respiratory distress. He has no wheezes. He has no rales. He exhibits no tenderness.  Abdominal: Soft. He exhibits no distension and no mass. There is no tenderness. There is no rebound and no guarding. No hernia.  Obese, protuberant abdomen.  Genitourinary:  Genitourinary Comments: No inguinal lymphadenopathy bilaterally.  Musculoskeletal:  No tenderness to the bilateral cervical, thoracic, lumbar spinous processes or bilateral paraspinal muscles.  Straight leg raise bilaterally.  No tenderness to palpation to the right hip, knee, or ankle.  Full active and passive range of motion of all joints.  Mild tenderness to palpation to the left medial knee.  No tenderness to the quadriceps or patellar tendon.  Left hip is nontender to palpation.  Left ankle is nontender with full active and passive range of motion.  Negative anterior posterior drawer test for the left knee.  Increased pain with external rotation of the left knee.  Full active and passive range of motion of the left knee and hip.  No overlying edema, erythema, or warmth.  No tenderness to palpation to the left IT band.  Large muscle groups of the left thigh are nontender.  No calf tenderness or swelling.  Able to bear weight on the bilateral lower extremities.  Good capillary refill of the digits of the bilateral lower extremities.  Mild antalgic gait.  Neurological: He is alert.  Skin: Skin is warm and dry.  Psychiatric: His behavior is normal.  Nursing note and vitals reviewed.    ED Treatments / Results  Labs (all labs ordered are listed, but only abnormal results are displayed) Labs Reviewed - No data  to display  EKG None  Radiology Dg Knee Complete 4 Views Left  Result Date: 06/09/2018 CLINICAL DATA:  Left knee pain. EXAM: LEFT KNEE - COMPLETE 4+ VIEW COMPARISON:  None. FINDINGS: Left knee is located without a fracture or large joint effusion. Mild spurring and degenerative changes in the knee compartments. IMPRESSION: Mild degenerative changes without acute abnormality to the left knee. Electronically Signed   By: Markus Daft M.D.   On: 06/09/2018 15:28   Dg Hip Unilat W Or Wo Pelvis 2-3 Views Left  Result Date: 06/09/2018 CLINICAL DATA:  Left groin and pelvic pain. EXAM: DG HIP (WITH OR WITHOUT PELVIS) 2-3V LEFT COMPARISON:  None. FINDINGS: The bony pelvic ring is intact. High-density structures overlying the iliac crests are probably related to bowel. Difficult to exclude a bone island particularly in the left iliac crest. Spurring and degenerative changes involving the left acetabulum. The left hip is located without a fracture. Subchondral sclerosis and degenerative changes involving the right acetabulum. Atherosclerotic calcifications in bilateral femoral arteries. IMPRESSION: No acute bone abnormality to the pelvis or left hip. Electronically Signed   By: Scherrie Gerlach.D.  On: 06/09/2018 15:30    Procedures Procedures (including critical care time)  Medications Ordered in ED Medications  acetaminophen (TYLENOL) tablet 1,000 mg (1,000 mg Oral Given 06/09/18 1524)     Initial Impression / Assessment and Plan / ED Course  I have reviewed the triage vital signs and the nursing notes.  Pertinent labs & imaging results that were available during my care of the patient were reviewed by me and considered in my medical decision making (see chart for details).     77 year old male with a history of hypertension and BPH presenting with left leg pain for the last week.  He moved prior to the onset of pain where he performed a lot of lifting and carrying furniture.  He is typically  sedentary at baseline.  Exam of the cervical, thoracic, and lumbar spine is unremarkable.  Negative straight leg raise bilaterally.  He is neurovascularly intact without deficit to the bilateral lower extremities.  On exam, the patient has increased pain with external rotation of the left knee.  Normal left hip exam.  X-ray of the left hip and knee with mild osteoarthritis.  He is also presenting with some left groin pain, which is I suspect is secondary to strain.  He is having no GU symptoms.  Patient was seen and evaluated along with Dr. Zenia Resides, attending physician.  We will discharge the patient with a left knee brace and rice therapy.  Orthopedics referral given.  Strict return precautions given to the ED.  He is hemodynamically stable and in no acute distress.  Safe for discharge home with outpatient follow-up at this time.  Final Clinical Impressions(s) / ED Diagnoses   Final diagnoses:  Primary osteoarthritis involving multiple joints  Groin strain, left, initial encounter  Acute pain of left knee    ED Discharge Orders    None       Joanne Gavel, PA-C 06/09/18 1652    Lacretia Leigh, MD 06/09/18 1916

## 2018-06-09 NOTE — Discharge Instructions (Addendum)
Thank you for allowing me to care for you today in the Emergency Department.   Take 400 mg of ibuprofen every 6 hours or 650 mg of Tylenol every 6 hours for pain.  You can apply ice or heat for 15 to 20 minutes to areas that are sore as frequently as needed.  Wear the knee sleeve on your left knee until your pain significantly improves.  You can also try applying a topical cream such as capsaicin or lidocaine cream, which are available over-the-counter, to any areas that are sore.  If your symptoms do not start to improve in the next week, please call Dr. Ninfa Linden to schedule follow-up appointment.  Return to the emergency department if you develop new or worsening symptoms including fever, if the knee or hip get red, hot, or swollen to the touch, if you develop new weakness or numbness in the legs, pee or poop on yourself, or if the scrotum gets red and swollen.

## 2018-06-09 NOTE — ED Provider Notes (Signed)
Medical screening examination/treatment/procedure(s) were conducted as a shared visit with non-physician practitioner(s) and myself.  I personally evaluated the patient during the encounter.  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  Pain is sharp and worse with any standing or movement.  On exam he has no signs of joint effusion.  Full range of motion at both his left knee and left hip.  Will check x-rays and likely discharge   Lacretia Leigh, MD 06/09/18 1445

## 2018-06-22 DIAGNOSIS — Z23 Encounter for immunization: Secondary | ICD-10-CM | POA: Diagnosis not present

## 2018-06-22 DIAGNOSIS — M549 Dorsalgia, unspecified: Secondary | ICD-10-CM | POA: Diagnosis not present

## 2018-06-22 DIAGNOSIS — N400 Enlarged prostate without lower urinary tract symptoms: Secondary | ICD-10-CM | POA: Diagnosis not present

## 2018-06-22 DIAGNOSIS — I10 Essential (primary) hypertension: Secondary | ICD-10-CM | POA: Diagnosis not present

## 2018-06-22 DIAGNOSIS — M2550 Pain in unspecified joint: Secondary | ICD-10-CM | POA: Diagnosis not present

## 2018-07-07 ENCOUNTER — Other Ambulatory Visit: Payer: Self-pay

## 2018-07-07 ENCOUNTER — Emergency Department (HOSPITAL_COMMUNITY): Payer: Medicare Other

## 2018-07-07 ENCOUNTER — Encounter (HOSPITAL_COMMUNITY): Payer: Self-pay | Admitting: Emergency Medicine

## 2018-07-07 ENCOUNTER — Emergency Department (HOSPITAL_COMMUNITY)
Admission: EM | Admit: 2018-07-07 | Discharge: 2018-07-07 | Disposition: A | Discharge status: Home / Self Care | Payer: Medicare Other | Attending: Emergency Medicine | Admitting: Emergency Medicine

## 2018-07-07 DIAGNOSIS — R42 Dizziness and giddiness: Secondary | ICD-10-CM | POA: Diagnosis not present

## 2018-07-07 DIAGNOSIS — Z8673 Personal history of transient ischemic attack (TIA), and cerebral infarction without residual deficits: Secondary | ICD-10-CM | POA: Insufficient documentation

## 2018-07-07 DIAGNOSIS — I10 Essential (primary) hypertension: Secondary | ICD-10-CM | POA: Insufficient documentation

## 2018-07-07 DIAGNOSIS — Z79899 Other long term (current) drug therapy: Secondary | ICD-10-CM | POA: Insufficient documentation

## 2018-07-07 DIAGNOSIS — H532 Diplopia: Secondary | ICD-10-CM | POA: Insufficient documentation

## 2018-07-07 DIAGNOSIS — R51 Headache: Secondary | ICD-10-CM | POA: Diagnosis not present

## 2018-07-07 DIAGNOSIS — R9431 Abnormal electrocardiogram [ECG] [EKG]: Secondary | ICD-10-CM | POA: Diagnosis not present

## 2018-07-07 HISTORY — DX: Transient cerebral ischemic attack, unspecified: G45.9

## 2018-07-07 LAB — DIFFERENTIAL
Abs Immature Granulocytes: 0 10*3/uL (ref 0.0–0.1)
Basophils Absolute: 0.1 10*3/uL (ref 0.0–0.1)
Basophils Relative: 1 %
Eosinophils Absolute: 0.2 10*3/uL (ref 0.0–0.7)
Eosinophils Relative: 2 %
Immature Granulocytes: 0 %
Lymphocytes Relative: 29 %
Lymphs Abs: 2 10*3/uL (ref 0.7–4.0)
Monocytes Absolute: 0.5 10*3/uL (ref 0.1–1.0)
Monocytes Relative: 7 %
Neutro Abs: 4.2 10*3/uL (ref 1.7–7.7)
Neutrophils Relative %: 61 %

## 2018-07-07 LAB — COMPREHENSIVE METABOLIC PANEL
ALT: 22 U/L (ref 0–44)
AST: 16 U/L (ref 15–41)
Albumin: 3.9 g/dL (ref 3.5–5.0)
Alkaline Phosphatase: 36 U/L — ABNORMAL LOW (ref 38–126)
Anion gap: 11 (ref 5–15)
BUN: 18 mg/dL (ref 8–23)
CO2: 23 mmol/L (ref 22–32)
Calcium: 9.1 mg/dL (ref 8.9–10.3)
Chloride: 106 mmol/L (ref 98–111)
Creatinine, Ser: 1.04 mg/dL (ref 0.61–1.24)
GFR calc Af Amer: >60 mL/min (ref >60)
GFR calc non Af Amer: >60 mL/min (ref >60)
Glucose, Bld: 149 mg/dL — ABNORMAL HIGH (ref 70–99)
Potassium: 4.1 mmol/L (ref 3.5–5.1)
Sodium: 140 mmol/L (ref 135–145)
Total Bilirubin: 0.6 mg/dL (ref 0.3–1.2)
Total Protein: 6.1 g/dL — ABNORMAL LOW (ref 6.5–8.1)

## 2018-07-07 LAB — I-STAT CHEM 8, ED
BUN: 21 mg/dL (ref 8–23)
Calcium, Ion: 1.15 mmol/L (ref 1.15–1.40)
Chloride: 105 mmol/L (ref 98–111)
Creatinine, Ser: 1.1 mg/dL (ref 0.61–1.24)
Glucose, Bld: 143 mg/dL — ABNORMAL HIGH (ref 70–99)
HCT: 45 % (ref 39.0–52.0)
Hemoglobin: 15.3 g/dL (ref 13.0–17.0)
Potassium: 4 mmol/L (ref 3.5–5.1)
Sodium: 139 mmol/L (ref 135–145)
TCO2: 25 mmol/L (ref 22–32)

## 2018-07-07 LAB — CBC
HCT: 47.8 % (ref 39.0–52.0)
Hemoglobin: 16 g/dL (ref 13.0–17.0)
MCH: 30.7 pg (ref 26.0–34.0)
MCHC: 33.5 g/dL (ref 30.0–36.0)
MCV: 91.7 fL (ref 78.0–100.0)
Platelets: 164 10*3/uL (ref 150–400)
RBC: 5.21 MIL/uL (ref 4.22–5.81)
RDW: 13.2 % (ref 11.5–15.5)
WBC: 6.8 10*3/uL (ref 4.0–10.5)

## 2018-07-07 LAB — I-STAT TROPONIN, ED: Troponin i, poc: 0 ng/mL (ref 0.00–0.08)

## 2018-07-07 LAB — APTT: aPTT: 31 seconds (ref 24–36)

## 2018-07-07 LAB — PROTIME-INR
INR: 0.95
Prothrombin Time: 12.6 seconds (ref 11.4–15.2)

## 2018-07-07 NOTE — ED Triage Notes (Signed)
Pt. Stated, Franklin Fisher had several TIA s in the past but when I had one this morning watching TV I had double vision . Ive also had a headache for the last 3-4 days.

## 2018-07-07 NOTE — Discharge Instructions (Signed)
You were evaluated in the emergency department for dizziness associated with double vision.  Your symptoms had resolved by the time he got here and you had a normal exam here.  After discussions with neurology you underwent an MRI MRA of your head that did not show any acute findings.  The recommendation from neurology is that you follow-up with them as an outpatient.  Please return if any worsening symptoms.

## 2018-07-07 NOTE — ED Notes (Signed)
Pt transported back to room from MRI via wheelchair.

## 2018-07-07 NOTE — ED Provider Notes (Signed)
[facility] EMERGENCY DEPARTMENT Provider Note   CSN: [id number] Arrival date & time: 07/07/18  1034     History   Chief Complaint Chief Complaint  Patient presents with  . Diplopia  . Dizziness    HPI Franklin Fisher is a 77 y.o. male.  He has a history of hypertension and also what he calls TIAs.  Michela Pitcher he has had multiple events over the past 10 years where he had this dizzy sensation in his head.  Says they usually last a minute or 2 when he seen neurology in the past and had CAT scans and MRIs.  He is moved here from [place] and been here about a month.  This morning while he was watching TV he said he acutely had this dizzy episode again feeling funny in his head but this time it was associated with vertical diplopia while he was watching TV.  It lasted about a minute or 2 and resolved.  He has had a low-grade headache for the past 4 days but he says he gets them fairly frequently.  Today's episode and also the prior episodes were not associated with any numbness weakness difficulty speaking.  No recent illness.  The history is provided by the patient.  Eye Problem   This is a new problem. The current episode started 3 to 5 hours ago. The problem has been resolved. There is a problem in both eyes. There was no injury mechanism. The patient is experiencing no pain. There is no history of trauma to the eye. There is no known exposure to pink eye. He does not wear contacts. Associated symptoms include double vision. Pertinent negatives include no numbness, no blurred vision, no decreased vision, no foreign body sensation, no photophobia, no nausea, no vomiting and no weakness. He has tried nothing for the symptoms. The treatment provided significant relief.    Past Medical History:  Diagnosis Date  . BPH (benign prostatic hyperplasia)   . Hypertension   . TIA (transient ischemic attack)     There are no active problems to display for this patient.   Past  Surgical History:  Procedure Laterality Date  . CHOLECYSTECTOMY    . HERNIA REPAIR          Home Medications    Prior to Admission medications   Medication Sig Start Date End Date Taking? Authorizing Provider  acetaminophen (TYLENOL) 325 MG tablet Take 650 mg by mouth every 6 (six) hours as needed for mild pain or headache.    [provider]  atenolol (TENORMIN) 100 MG tablet Take 100 mg by mouth daily. 04/30/18   [provider]  clotrimazole-betamethasone (LOTRISONE) cream Apply 1 application topically daily as needed for itching. 05/08/18   [provider]  ibuprofen (ADVIL,MOTRIN) 200 MG tablet Take 400 mg by mouth every 6 (six) hours as needed for fever, headache or mild pain.    [provider]  ketoconazole (NIZORAL) 2 % shampoo Apply 1 application topically daily as needed for irritation.  03/29/18   [provider]  tamsulosin (FLOMAX) 0.4 MG CAPS capsule Take 0.4 mg by mouth daily. 05/01/18   [provider]    Family History No family history on file.  Social History Social History   Tobacco Use  . Smoking status: Never Smoker  . Smokeless tobacco: Never Used  Substance Use Topics  . Alcohol use: Not Currently  . Drug use: Never     Allergies   Ivp dye [iodinated  diagnostic agents]   Review of Systems Review of Systems  Constitutional: Negative for fever.  HENT: Negative for sore throat.   Eyes: Positive for double vision and visual disturbance. Negative for blurred vision and photophobia.  Respiratory: Negative for shortness of breath.   Cardiovascular: Negative for chest pain.  Gastrointestinal: Negative for abdominal pain, nausea and vomiting.  Genitourinary: Negative for dysuria.  Musculoskeletal: Negative for neck pain.  Skin: Negative for rash.  Neurological: Positive for dizziness and headaches. Negative for seizures, syncope, speech difficulty, weakness and numbness.     Physical Exam Updated  Vital Signs BP 125/73   Pulse 64   Temp (!) 97.3 F (36.3 C) (Oral)   Resp 17   Ht 5\' 3"  (1.6 m)   Wt 90.7 kg   SpO2 98%   BMI 35.43 kg/m   Physical Exam  Constitutional: He is oriented to person, place, and time. He appears well-developed and well-nourished.  HENT:  Head: Normocephalic and atraumatic.  Eyes: Conjunctivae are normal.  Neck: Neck supple.  Cardiovascular: Normal rate and regular rhythm.  No murmur heard. Pulmonary/Chest: Effort normal and breath sounds normal. No respiratory distress.  Abdominal: Soft. There is no tenderness.  Musculoskeletal: Normal range of motion. He exhibits no edema or deformity.  Neurological: He is alert and oriented to person, place, and time. He has normal strength. No cranial nerve deficit or sensory deficit. Gait normal. GCS eye subscore is 4. GCS verbal subscore is 5. GCS motor subscore is 6.  Visual fields intact to confrontation  Skin: Skin is warm and dry.  Psychiatric: He has a normal mood and affect.  Nursing note and vitals reviewed.    ED Treatments / Results  Labs (all labs ordered are listed, but only abnormal results are displayed) Labs Reviewed  COMPREHENSIVE METABOLIC PANEL - Abnormal; Notable for the following components:      Result Value   Glucose, Bld 149 (*)    Total Protein 6.1 (*)    Alkaline Phosphatase 36 (*)    All other components within normal limits  I-STAT CHEM 8, ED - Abnormal; Notable for the following components:   Glucose, Bld 143 (*)    All other components within normal limits  PROTIME-INR  APTT  CBC  DIFFERENTIAL  I-STAT TROPONIN, ED    EKG EKG Interpretation  Date/Time:  Saturday July 07 2018 10:45:13 EDT Ventricular Rate:  68 PR Interval:  198 QRS Duration: 96 QT Interval:  438 QTC Calculation: 465 R Axis:   -41 Text Interpretation:  Normal sinus rhythm Left axis deviation Cannot rule out Anterior infarct , age undetermined Abnormal ECG no prior to compare with Confirmed by  Aletta Edouard [phone]) on 07/07/2018 12:05:40 PM   Radiology Ct Head Wo Contrast  Result Date: 07/07/2018 CLINICAL DATA:  77 year old male with acute transient diplopia and headache. EXAM: CT HEAD WITHOUT CONTRAST TECHNIQUE: Contiguous axial images were obtained from the base of the skull through the vertex without intravenous contrast. COMPARISON:  None. FINDINGS: Brain: No evidence of acute infarction, hemorrhage, hydrocephalus, extra-axial collection or mass lesion/mass effect. Mild periventricular hypodensities likely represent chronic small-vessel white matter ischemic changes. Vascular: Vertebral and carotid atherosclerotic calcifications identified. Skull: Normal. Negative for fracture or focal lesion. Sinuses/Orbits: No acute finding. Other: None. IMPRESSION: 1. No evidence of acute intracranial abnormality. 2. Mild probable chronic small-vessel white matter ischemic changes. Electronically Signed   By: Margarette Canada M.D.   On: 07/07/2018 11:36   Mr Jodene Nam Head Wo Contrast  Result  Date: 07/07/2018 CLINICAL DATA:  Acute transient diplopia and headache. Evaluate for stroke. EXAM: MRI HEAD WITHOUT CONTRAST MRA HEAD WITHOUT CONTRAST TECHNIQUE: Multiplanar, multiecho pulse sequences of the brain and surrounding structures were obtained without intravenous contrast. Angiographic images of the head were obtained using MRA technique without contrast. COMPARISON:  CT head earlier today. FINDINGS: MRI HEAD FINDINGS Brain: No evidence for acute infarction, hemorrhage, mass lesion, hydrocephalus, or extra-axial fluid. Generalized atrophy. Mild subcortical and periventricular T2 and FLAIR hyperintensities, likely chronic microvascular ischemic change. Vascular: Reported separately. Skull and upper cervical spine: Normal marrow signal. Sinuses/Orbits: Negative. BILATERAL cataract extraction. No orbital masses. Other: None. MRA HEAD FINDINGS The internal carotid arteries are widely patent. Basilar artery is widely  patent. Both vertebrals contribute to formation of basilar, LEFT dominant. There is no stenosis of the RIGHT or LEFT middle cerebral arteries or their branches. Dominant LEFT anterior cerebral artery, hypoplastic RIGHT A1 ACA, with both anterior cerebral arteries distally supplied from the LEFT. Fetal RIGHT PCA. LEFT PCA originates from the basilar. No cerebellar branch occlusion. No intracranial saccular aneurysm. IMPRESSION: Atrophy and small vessel disease.  No acute stroke or hemorrhage. Unremarkable MRA intracranial circulation. Electronically Signed   By: Staci Righter M.D.   On: 07/07/2018 14:47   Mr Brain Wo Contrast  Result Date: 07/07/2018 CLINICAL DATA:  Acute transient diplopia and headache. Evaluate for stroke. EXAM: MRI HEAD WITHOUT CONTRAST MRA HEAD WITHOUT CONTRAST TECHNIQUE: Multiplanar, multiecho pulse sequences of the brain and surrounding structures were obtained without intravenous contrast. Angiographic images of the head were obtained using MRA technique without contrast. COMPARISON:  CT head earlier today. FINDINGS: MRI HEAD FINDINGS Brain: No evidence for acute infarction, hemorrhage, mass lesion, hydrocephalus, or extra-axial fluid. Generalized atrophy. Mild subcortical and periventricular T2 and FLAIR hyperintensities, likely chronic microvascular ischemic change. Vascular: Reported separately. Skull and upper cervical spine: Normal marrow signal. Sinuses/Orbits: Negative. BILATERAL cataract extraction. No orbital masses. Other: None. MRA HEAD FINDINGS The internal carotid arteries are widely patent. Basilar artery is widely patent. Both vertebrals contribute to formation of basilar, LEFT dominant. There is no stenosis of the RIGHT or LEFT middle cerebral arteries or their branches. Dominant LEFT anterior cerebral artery, hypoplastic RIGHT A1 ACA, with both anterior cerebral arteries distally supplied from the LEFT. Fetal RIGHT PCA. LEFT PCA originates from the basilar. No cerebellar  branch occlusion. No intracranial saccular aneurysm. IMPRESSION: Atrophy and small vessel disease.  No acute stroke or hemorrhage. Unremarkable MRA intracranial circulation. Electronically Signed   By: Staci Righter M.D.   On: 07/07/2018 14:47    Procedures Procedures (including critical care time)  Medications Ordered in ED Medications - No data to display   Initial Impression / Assessment and Plan / ED Course  I have reviewed the triage vital signs and the nursing notes.  Pertinent labs & imaging results that were available during my care of the patient were reviewed by me and considered in my medical decision making (see chart for details).  Clinical Course as of Jul 08 26  Sat Jul 07, 2018  1246 Discussed with Dr. Lorraine Lax from neurology.  He recommends with proceeding with CT angios head and neck as we want to document that he does not have any basilar insufficiency.  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   [MB]  1250 Patient has a contrast allergy to the IV dye from CT.  Reviewed this with Dr. Lorraine Lax and he recommends getting an  MRI/MRA of brain.  Patient updated with plan is comfortable with it.   [MB]  5087 Patient returns from MRI.  Awaiting radiology reading.   [MB]  1994 Patient's MRI and MRA do not show any obvious explanation for his symptoms.  Per plan from neurology patient can be discharged to follow-up outpatient with neurology.   [MB]    Clinical Course User Index [MB] Hayden Rasmussen, MD     Final Clinical Impressions(s) / ED Diagnoses   Final diagnoses:  Dizziness  Diplopia    ED Discharge Orders    None       Hayden Rasmussen, MD 07/08/18 0028

## 2018-07-07 NOTE — ED Notes (Signed)
Pt ambulatory to bathroom to void, pt transported to MRI via wheelchair.

## 2018-07-11 DIAGNOSIS — G894 Chronic pain syndrome: Secondary | ICD-10-CM | POA: Diagnosis not present

## 2018-07-11 DIAGNOSIS — M545 Low back pain: Secondary | ICD-10-CM | POA: Diagnosis not present

## 2018-07-11 DIAGNOSIS — G629 Polyneuropathy, unspecified: Secondary | ICD-10-CM | POA: Diagnosis not present

## 2018-07-11 DIAGNOSIS — Z79899 Other long term (current) drug therapy: Secondary | ICD-10-CM | POA: Diagnosis not present

## 2018-07-11 DIAGNOSIS — M79604 Pain in right leg: Secondary | ICD-10-CM | POA: Diagnosis not present

## 2018-07-11 DIAGNOSIS — Z79891 Long term (current) use of opiate analgesic: Secondary | ICD-10-CM | POA: Diagnosis not present

## 2018-08-08 DIAGNOSIS — G894 Chronic pain syndrome: Secondary | ICD-10-CM | POA: Diagnosis not present

## 2018-08-08 DIAGNOSIS — Z79899 Other long term (current) drug therapy: Secondary | ICD-10-CM | POA: Diagnosis not present

## 2018-08-08 DIAGNOSIS — Z79891 Long term (current) use of opiate analgesic: Secondary | ICD-10-CM | POA: Diagnosis not present

## 2018-08-08 DIAGNOSIS — M545 Low back pain: Secondary | ICD-10-CM | POA: Diagnosis not present

## 2018-08-17 DIAGNOSIS — L309 Dermatitis, unspecified: Secondary | ICD-10-CM | POA: Diagnosis not present

## 2018-08-17 DIAGNOSIS — M25552 Pain in left hip: Secondary | ICD-10-CM | POA: Diagnosis not present

## 2018-08-28 DIAGNOSIS — H9312 Tinnitus, left ear: Secondary | ICD-10-CM | POA: Diagnosis not present

## 2018-08-30 DIAGNOSIS — M1612 Unilateral primary osteoarthritis, left hip: Secondary | ICD-10-CM | POA: Diagnosis not present

## 2018-08-30 DIAGNOSIS — M1611 Unilateral primary osteoarthritis, right hip: Secondary | ICD-10-CM | POA: Diagnosis not present

## 2018-09-03 DIAGNOSIS — M150 Primary generalized (osteo)arthritis: Secondary | ICD-10-CM | POA: Diagnosis not present

## 2018-09-03 DIAGNOSIS — G629 Polyneuropathy, unspecified: Secondary | ICD-10-CM | POA: Diagnosis not present

## 2018-09-03 DIAGNOSIS — Z6836 Body mass index (BMI) 36.0-36.9, adult: Secondary | ICD-10-CM | POA: Diagnosis not present

## 2018-09-03 DIAGNOSIS — E669 Obesity, unspecified: Secondary | ICD-10-CM | POA: Diagnosis not present

## 2018-09-04 DIAGNOSIS — M47816 Spondylosis without myelopathy or radiculopathy, lumbar region: Secondary | ICD-10-CM | POA: Diagnosis not present

## 2018-09-04 DIAGNOSIS — Z79891 Long term (current) use of opiate analgesic: Secondary | ICD-10-CM | POA: Diagnosis not present

## 2018-09-04 DIAGNOSIS — M79673 Pain in unspecified foot: Secondary | ICD-10-CM | POA: Diagnosis not present

## 2018-09-04 DIAGNOSIS — G894 Chronic pain syndrome: Secondary | ICD-10-CM | POA: Diagnosis not present

## 2018-09-04 DIAGNOSIS — Z79899 Other long term (current) drug therapy: Secondary | ICD-10-CM | POA: Diagnosis not present

## 2018-09-10 DIAGNOSIS — D229 Melanocytic nevi, unspecified: Secondary | ICD-10-CM | POA: Diagnosis not present

## 2018-09-10 DIAGNOSIS — L819 Disorder of pigmentation, unspecified: Secondary | ICD-10-CM | POA: Diagnosis not present

## 2018-09-10 DIAGNOSIS — D1801 Hemangioma of skin and subcutaneous tissue: Secondary | ICD-10-CM | POA: Diagnosis not present

## 2018-09-10 DIAGNOSIS — L821 Other seborrheic keratosis: Secondary | ICD-10-CM | POA: Diagnosis not present

## 2018-09-10 DIAGNOSIS — L814 Other melanin hyperpigmentation: Secondary | ICD-10-CM | POA: Diagnosis not present

## 2018-09-10 DIAGNOSIS — L218 Other seborrheic dermatitis: Secondary | ICD-10-CM | POA: Diagnosis not present

## 2018-09-11 DIAGNOSIS — J342 Deviated nasal septum: Secondary | ICD-10-CM | POA: Diagnosis not present

## 2018-09-11 DIAGNOSIS — H903 Sensorineural hearing loss, bilateral: Secondary | ICD-10-CM | POA: Diagnosis not present

## 2018-09-11 DIAGNOSIS — J343 Hypertrophy of nasal turbinates: Secondary | ICD-10-CM | POA: Insufficient documentation

## 2018-09-11 DIAGNOSIS — G4733 Obstructive sleep apnea (adult) (pediatric): Secondary | ICD-10-CM | POA: Diagnosis not present

## 2018-10-02 DIAGNOSIS — M47816 Spondylosis without myelopathy or radiculopathy, lumbar region: Secondary | ICD-10-CM | POA: Diagnosis not present

## 2018-10-02 DIAGNOSIS — G894 Chronic pain syndrome: Secondary | ICD-10-CM | POA: Diagnosis not present

## 2018-10-04 DIAGNOSIS — M1611 Unilateral primary osteoarthritis, right hip: Secondary | ICD-10-CM | POA: Diagnosis not present

## 2018-10-04 DIAGNOSIS — M1612 Unilateral primary osteoarthritis, left hip: Secondary | ICD-10-CM | POA: Diagnosis not present

## 2018-10-26 ENCOUNTER — Ambulatory Visit
Admission: RE | Admit: 2018-10-26 | Discharge: 2018-10-26 | Disposition: A | Discharge status: Home / Self Care | Payer: Medicare Other | Source: Ambulatory Visit | Attending: Physician Assistant | Admitting: Physician Assistant

## 2018-10-26 ENCOUNTER — Other Ambulatory Visit: Payer: Self-pay | Admitting: Physician Assistant

## 2018-10-26 DIAGNOSIS — M79672 Pain in left foot: Secondary | ICD-10-CM

## 2018-10-30 DIAGNOSIS — Z79899 Other long term (current) drug therapy: Secondary | ICD-10-CM | POA: Diagnosis not present

## 2018-10-30 DIAGNOSIS — M25559 Pain in unspecified hip: Secondary | ICD-10-CM | POA: Diagnosis not present

## 2018-10-30 DIAGNOSIS — M47816 Spondylosis without myelopathy or radiculopathy, lumbar region: Secondary | ICD-10-CM | POA: Diagnosis not present

## 2018-10-30 DIAGNOSIS — G894 Chronic pain syndrome: Secondary | ICD-10-CM | POA: Diagnosis not present

## 2018-10-30 DIAGNOSIS — Z79891 Long term (current) use of opiate analgesic: Secondary | ICD-10-CM | POA: Diagnosis not present

## 2018-10-31 DIAGNOSIS — L820 Inflamed seborrheic keratosis: Secondary | ICD-10-CM | POA: Diagnosis not present

## 2018-10-31 DIAGNOSIS — L218 Other seborrheic dermatitis: Secondary | ICD-10-CM | POA: Diagnosis not present

## 2018-11-05 DIAGNOSIS — N644 Mastodynia: Secondary | ICD-10-CM | POA: Diagnosis not present

## 2018-11-05 DIAGNOSIS — L309 Dermatitis, unspecified: Secondary | ICD-10-CM | POA: Diagnosis not present

## 2018-11-09 ENCOUNTER — Encounter: Attending: Urology | Primary: Internal Medicine

## 2018-11-09 ENCOUNTER — Encounter: Payer: MEDICARE | Attending: Urology | Primary: Internal Medicine

## 2018-11-12 ENCOUNTER — Other Ambulatory Visit: Payer: Self-pay | Admitting: Family Medicine

## 2018-11-12 DIAGNOSIS — N644 Mastodynia: Secondary | ICD-10-CM

## 2018-11-15 ENCOUNTER — Other Ambulatory Visit: Payer: Self-pay | Admitting: Internal Medicine

## 2018-11-15 ENCOUNTER — Other Ambulatory Visit: Payer: Self-pay | Admitting: Obstetrics & Gynecology

## 2018-11-16 ENCOUNTER — Ambulatory Visit
Admission: RE | Admit: 2018-11-16 | Discharge: 2018-11-16 | Disposition: A | Discharge status: Home / Self Care | Payer: Medicare Other | Source: Ambulatory Visit | Attending: Family Medicine | Admitting: Family Medicine

## 2018-11-16 ENCOUNTER — Ambulatory Visit: Payer: Medicare Other

## 2018-11-16 DIAGNOSIS — N644 Mastodynia: Secondary | ICD-10-CM

## 2018-11-16 DIAGNOSIS — R928 Other abnormal and inconclusive findings on diagnostic imaging of breast: Secondary | ICD-10-CM | POA: Diagnosis not present

## 2018-12-04 DIAGNOSIS — M47816 Spondylosis without myelopathy or radiculopathy, lumbar region: Secondary | ICD-10-CM | POA: Diagnosis not present

## 2018-12-04 DIAGNOSIS — M25559 Pain in unspecified hip: Secondary | ICD-10-CM | POA: Diagnosis not present

## 2018-12-04 DIAGNOSIS — G894 Chronic pain syndrome: Secondary | ICD-10-CM | POA: Diagnosis not present

## 2018-12-05 ENCOUNTER — Other Ambulatory Visit: Payer: Self-pay | Admitting: Pain Medicine

## 2018-12-05 DIAGNOSIS — M545 Low back pain, unspecified: Secondary | ICD-10-CM

## 2018-12-15 ENCOUNTER — Other Ambulatory Visit: Payer: Medicare Other

## 2018-12-20 ENCOUNTER — Ambulatory Visit
Admission: RE | Admit: 2018-12-20 | Discharge: 2018-12-20 | Disposition: A | Discharge status: Home / Self Care | Payer: Medicare Other | Source: Ambulatory Visit | Attending: Pain Medicine | Admitting: Pain Medicine

## 2018-12-20 DIAGNOSIS — M545 Low back pain, unspecified: Secondary | ICD-10-CM

## 2018-12-20 DIAGNOSIS — M48061 Spinal stenosis, lumbar region without neurogenic claudication: Secondary | ICD-10-CM | POA: Diagnosis not present

## 2018-12-20 DIAGNOSIS — M47816 Spondylosis without myelopathy or radiculopathy, lumbar region: Secondary | ICD-10-CM | POA: Diagnosis not present

## 2018-12-24 ENCOUNTER — Other Ambulatory Visit: Payer: Self-pay | Admitting: Orthopedic Surgery

## 2018-12-28 DIAGNOSIS — N200 Calculus of kidney: Secondary | ICD-10-CM | POA: Diagnosis not present

## 2018-12-28 DIAGNOSIS — N400 Enlarged prostate without lower urinary tract symptoms: Secondary | ICD-10-CM | POA: Diagnosis not present

## 2018-12-31 ENCOUNTER — Other Ambulatory Visit: Payer: Self-pay

## 2018-12-31 ENCOUNTER — Encounter (INDEPENDENT_AMBULATORY_CARE_PROVIDER_SITE_OTHER): Payer: Self-pay | Admitting: Physician Assistant

## 2018-12-31 ENCOUNTER — Ambulatory Visit (INDEPENDENT_AMBULATORY_CARE_PROVIDER_SITE_OTHER): Payer: Medicare Other

## 2018-12-31 ENCOUNTER — Ambulatory Visit (INDEPENDENT_AMBULATORY_CARE_PROVIDER_SITE_OTHER): Payer: Medicare Other | Admitting: Physician Assistant

## 2018-12-31 DIAGNOSIS — M79642 Pain in left hand: Secondary | ICD-10-CM | POA: Diagnosis not present

## 2018-12-31 DIAGNOSIS — M65341 Trigger finger, right ring finger: Secondary | ICD-10-CM

## 2018-12-31 DIAGNOSIS — M79641 Pain in right hand: Secondary | ICD-10-CM | POA: Diagnosis not present

## 2018-12-31 DIAGNOSIS — M25511 Pain in right shoulder: Secondary | ICD-10-CM

## 2018-12-31 MED ORDER — LIDOCAINE HCL 1 % IJ SOLN
0.5000 mL | INTRAMUSCULAR | Status: AC | PRN
  Administered 2018-12-31: .5 mL

## 2018-12-31 MED ORDER — DICLOFENAC SODIUM 1 % TD GEL: 2.0000 g | Freq: Four times a day (QID) | TRANSDERMAL | Status: AC

## 2018-12-31 MED ORDER — LIDOCAINE HCL 1 % IJ SOLN
3.0000 mL | INTRAMUSCULAR | Status: AC | PRN
  Administered 2018-12-31: 3 mL

## 2018-12-31 MED ORDER — METHYLPREDNISOLONE ACETATE 40 MG/ML IJ SUSP
40.0000 mg | INTRAMUSCULAR | Status: AC | PRN
  Administered 2018-12-31: 40 mg via INTRA_ARTICULAR

## 2018-12-31 NOTE — Progress Notes (Signed)
Office Visit Note   Patient: Franklin Fisher           Date of Birth: 1941/05/16           MRN: [id number] Visit Date: 12/31/2018              Requested by: Franklin Pepper, MD [address], [place] [address] PCP: Franklin Pepper, MD   Assessment & Plan: Visit Diagnoses:  1. Bilateral hand pain   2. Acute pain of right shoulder   3. Trigger finger, right ring finger     Plan: He is given handouts for shoulder exercises and these are reviewed with him.  He is given a prescription for Voltaren gel which he can apply to his hands including the trigger finger up to 2 g 4 times daily to each hand.  He will follow-up with Dr. Mayer Camel as he is his orthopedist.  Questions encouraged and answered at length.  Follow-Up Instructions: Return if symptoms worsen or fail to improve.   Orders:  Orders Placed This Encounter  Procedures  . Large Joint Inj  . XR Hand Complete Right  . XR Hand Complete Left  . XR Shoulder Right   Meds ordered this encounter  Medications  . diclofenac sodium (VOLTAREN) 1 % transdermal gel 2 g      Procedures: Large Joint Inj: R subacromial bursa on 12/31/2018 11:21 AM Indications: pain Details: 22 G 1.5 in needle, superior approach  Arthrogram: No  Medications: 3 mL lidocaine 1 %; 40 mg methylPREDNISolone acetate 40 MG/ML; 0.5 mL lidocaine 1 % Outcome: tolerated well, no immediate complications Procedure, treatment alternatives, risks and benefits explained, specific risks discussed. Consent was given by the patient. Immediately prior to procedure a time out was called to verify the correct patient, procedure, equipment, support staff and site/side marked as required. Patient was prepped and draped in the usual sterile fashion.       Clinical Data: No additional findings.   Subjective: Chief Complaint  Patient presents with  . Right Hand - Pain  . Left Hand - Pain    HPI Patient is a 78 year old male comes in today with  bilateral hand pain for the past 1 to 2 weeks.  Also the right ring finger triggering for the last 2 to 3 weeks.  He has had no known injury to either hand.  He denies any numbness tingling either hand Fnu states that he does feel stiff.  Also feels that the hands feel swollen.  Is also having right shoulder pain would like an injection for this.  He is an established patient of Dr. Damita Dunnings and is due to have a hip replacement sometime in the near future. Review of Systems See HPI otherwise negative  Objective: Vital Signs: There were no vitals taken for this visit.  Physical Exam Constitutional:      Appearance: Normal appearance. He is not ill-appearing or diaphoretic.  Pulmonary:     Effort: Pulmonary effort is normal.  Neurological:     Mental Status: He is alert and oriented to person, place, and time.     Ortho Exam Bilateral hands full sensation full motor.  No rashes skin lesions ulcerations.  No edema erythema of either hand.  Right active trigger finger.  Is full range of motion of both hands without pain.  No other active triggering.  No deformities of either hand.  Negative Tinel's at the wrist bilaterally.  Bilateral shoulders 5 and a 5 strength with  external and internal rotation against resistance empty can test negative bilaterally but causes discomfort on the right side.  Positive impingement on the right negative on the left.  Liftoff test is negative bilaterally.  He has slightly diminished internal rotation of the right shoulder. Specialty Comments:  No specialty comments available.  Imaging: Xr Hand Complete Left  Result Date: 12/31/2018 Left hand 3 views: No acute fractures no bony abnormalities.  No erosive changes of any of the joints.  Xr Hand Complete Right  Result Date: 12/31/2018 Right hand: 3 views showed no acute findings.  No significant arthritic changes or erosive changes of any of the joints.  Xr Shoulder Right  Result Date: 12/31/2018 Right  shoulder 3 views: Glenohumeral joint is well-maintained.  Downsloping acromium present.  No significant arthritic changes acute fractures.  Subacromial space well maintained otherwise.    PMFS History: There are no active problems to display for this patient.  Past Medical History:  Diagnosis Date  . BPH (benign prostatic hyperplasia)   . Hypertension   . TIA (transient ischemic attack)     Family History  Problem Relation Age of Onset  . Breast cancer Sister     Past Surgical History:  Procedure Laterality Date  . CHOLECYSTECTOMY    . HERNIA REPAIR     Social History   Occupational History  . Not on file  Tobacco Use  . Smoking status: Never Smoker  . Smokeless tobacco: Never Used  Substance and Sexual Activity  . Alcohol use: Not Currently  . Drug use: Never  . Sexual activity: Not on file

## 2019-01-08 DIAGNOSIS — M47816 Spondylosis without myelopathy or radiculopathy, lumbar region: Secondary | ICD-10-CM | POA: Diagnosis not present

## 2019-01-08 DIAGNOSIS — G894 Chronic pain syndrome: Secondary | ICD-10-CM | POA: Diagnosis not present

## 2019-01-08 DIAGNOSIS — M25559 Pain in unspecified hip: Secondary | ICD-10-CM | POA: Diagnosis not present

## 2019-01-08 DIAGNOSIS — M79673 Pain in unspecified foot: Secondary | ICD-10-CM | POA: Diagnosis not present

## 2019-01-28 ENCOUNTER — Inpatient Hospital Stay: Admit: 2019-01-28 | Payer: Medicare Other | Admitting: Orthopedic Surgery

## 2019-01-28 SURGERY — ARTHROPLASTY, HIP, TOTAL, ANTERIOR APPROACH
Anesthesia: Spinal | Laterality: Left

## 2019-01-31 DIAGNOSIS — L03115 Cellulitis of right lower limb: Secondary | ICD-10-CM | POA: Diagnosis not present

## 2019-02-04 DIAGNOSIS — L089 Local infection of the skin and subcutaneous tissue, unspecified: Secondary | ICD-10-CM | POA: Diagnosis not present

## 2019-02-05 DIAGNOSIS — G894 Chronic pain syndrome: Secondary | ICD-10-CM | POA: Diagnosis not present

## 2019-02-05 DIAGNOSIS — M25559 Pain in unspecified hip: Secondary | ICD-10-CM | POA: Diagnosis not present

## 2019-02-05 DIAGNOSIS — M79673 Pain in unspecified foot: Secondary | ICD-10-CM | POA: Diagnosis not present

## 2019-02-05 DIAGNOSIS — Z79891 Long term (current) use of opiate analgesic: Secondary | ICD-10-CM | POA: Diagnosis not present

## 2019-02-05 DIAGNOSIS — Z79899 Other long term (current) drug therapy: Secondary | ICD-10-CM | POA: Diagnosis not present

## 2019-02-05 DIAGNOSIS — M47816 Spondylosis without myelopathy or radiculopathy, lumbar region: Secondary | ICD-10-CM | POA: Diagnosis not present

## 2019-02-06 DIAGNOSIS — R21 Rash and other nonspecific skin eruption: Secondary | ICD-10-CM | POA: Diagnosis not present

## 2019-02-09 ENCOUNTER — Other Ambulatory Visit: Payer: Self-pay

## 2019-02-09 ENCOUNTER — Encounter (HOSPITAL_COMMUNITY): Payer: Self-pay

## 2019-02-09 ENCOUNTER — Emergency Department (HOSPITAL_BASED_OUTPATIENT_CLINIC_OR_DEPARTMENT_OTHER): Payer: Medicare Other

## 2019-02-09 ENCOUNTER — Emergency Department (HOSPITAL_COMMUNITY)
Admission: EM | Admit: 2019-02-09 | Discharge: 2019-02-09 | Disposition: A | Discharge status: Home / Self Care | Payer: Medicare Other | Attending: Emergency Medicine | Admitting: Emergency Medicine

## 2019-02-09 DIAGNOSIS — L03115 Cellulitis of right lower limb: Secondary | ICD-10-CM | POA: Insufficient documentation

## 2019-02-09 DIAGNOSIS — Z79899 Other long term (current) drug therapy: Secondary | ICD-10-CM | POA: Diagnosis not present

## 2019-02-09 DIAGNOSIS — R609 Edema, unspecified: Secondary | ICD-10-CM

## 2019-02-09 DIAGNOSIS — I10 Essential (primary) hypertension: Secondary | ICD-10-CM | POA: Diagnosis not present

## 2019-02-09 DIAGNOSIS — L539 Erythematous condition, unspecified: Secondary | ICD-10-CM | POA: Diagnosis present

## 2019-02-09 DIAGNOSIS — L959 Vasculitis limited to the skin, unspecified: Secondary | ICD-10-CM | POA: Diagnosis not present

## 2019-02-09 LAB — CBC WITH DIFFERENTIAL/PLATELET
Abs Immature Granulocytes: 0.02 10*3/uL (ref 0.00–0.07)
Basophils Absolute: 0.1 10*3/uL (ref 0.0–0.1)
Basophils Relative: 1 %
Eosinophils Absolute: 0.2 10*3/uL (ref 0.0–0.5)
Eosinophils Relative: 4 %
HCT: 42.8 % (ref 39.0–52.0)
Hemoglobin: 14.2 g/dL (ref 13.0–17.0)
Immature Granulocytes: 0 %
Lymphocytes Relative: 31 %
Lymphs Abs: 1.7 10*3/uL (ref 0.7–4.0)
MCH: 30.3 pg (ref 26.0–34.0)
MCHC: 33.2 g/dL (ref 30.0–36.0)
MCV: 91.3 fL (ref 80.0–100.0)
Monocytes Absolute: 0.5 10*3/uL (ref 0.1–1.0)
Monocytes Relative: 9 %
Neutro Abs: 3 10*3/uL (ref 1.7–7.7)
Neutrophils Relative %: 55 %
Platelets: 147 10*3/uL — ABNORMAL LOW (ref 150–400)
RBC: 4.69 MIL/uL (ref 4.22–5.81)
RDW: 13 % (ref 11.5–15.5)
WBC: 5.4 10*3/uL (ref 4.0–10.5)
nRBC: 0 % (ref 0.0–0.2)

## 2019-02-09 LAB — BASIC METABOLIC PANEL
Anion gap: 9 (ref 5–15)
BUN: 20 mg/dL (ref 8–23)
CO2: 22 mmol/L (ref 22–32)
Calcium: 9 mg/dL (ref 8.9–10.3)
Chloride: 104 mmol/L (ref 98–111)
Creatinine, Ser: 0.9 mg/dL (ref 0.61–1.24)
GFR calc Af Amer: >60 mL/min (ref >60)
GFR calc non Af Amer: >60 mL/min (ref >60)
Glucose, Bld: 114 mg/dL — ABNORMAL HIGH (ref 70–99)
Potassium: 4 mmol/L (ref 3.5–5.1)
Sodium: 135 mmol/L (ref 135–145)

## 2019-02-09 NOTE — ED Triage Notes (Signed)
Pt's girlfriend noticed scratch on leg about a week ago, tried neosporin but no effect. Televisit with doctor, two courses of antibiotics and leg seems to be worsening

## 2019-02-09 NOTE — Discharge Instructions (Addendum)
Finish your antibiotics.  Follow-up with your doctor for further evaluation.  You may need to have further testing on this area to determine the exact cause.

## 2019-02-09 NOTE — Progress Notes (Signed)
Lower extremity venous has been completed.   Preliminary results in CV Proc.   Abram Sander 02/09/2019 11:48 AM

## 2019-02-10 NOTE — ED Provider Notes (Signed)
[facility] EMERGENCY DEPARTMENT Provider Note   CSN: [id number] Arrival date & time: 02/09/19  1005    History   Chief Complaint Chief Complaint  Patient presents with   Cellulitis    HPI Franklin Fisher is a 78 y.o. male.     HPI Patient presents to the emergency department with redness and swelling to the lateral lower leg on the right.  The patient states he was seen by his doctor and an urgent care twice.  Patient states he was placed on antibiotics and has not completed the full course.  To be noticed some red splotchy around the central area of redness.  Patient states that he used elevation on the region as well.  The patient denies chest pain, shortness of breath, headache,blurred vision, neck pain, fever, cough, weakness, numbness, dizziness, anorexia, edema, abdominal pain, nausea, vomiting, diarrhea, rash, back pain, dysuria, hematemesis, bloody stool, near syncope, or syncope. Past Medical History:  Diagnosis Date   BPH (benign prostatic hyperplasia)    Hypertension    TIA (transient ischemic attack)     There are no active problems to display for this patient.   Past Surgical History:  Procedure Laterality Date   CHOLECYSTECTOMY     HERNIA REPAIR          Home Medications    Prior to Admission medications   Medication Sig Start Date End Date Taking? Authorizing Provider  acetaminophen (TYLENOL) 325 MG tablet Take 650 mg by mouth every 6 (six) hours as needed for mild pain or headache.   Yes [provider]  albuterol (PROVENTIL HFA;VENTOLIN HFA) 108 (90 Base) MCG/ACT inhaler Inhale 2 puffs into the lungs every 6 (six) hours as needed for wheezing or shortness of breath.   Yes [provider]  atenolol (TENORMIN) 100 MG tablet Take 100 mg by mouth daily. 04/30/18  Yes [provider]  cephALEXin (KEFLEX) 500 MG capsule Take 1 capsule by mouth 3 (three) times daily. For 10 days- started on 02-04-19 02/04/19   Yes [provider]  clotrimazole-betamethasone (LOTRISONE) cream Apply 1 application topically daily as needed for itching. 05/08/18  Yes [provider]  ketoconazole (NIZORAL) 2 % shampoo Apply 1 application topically daily as needed for irritation.  03/29/18  Yes [provider]  oxyCODONE-acetaminophen (PERCOCET) 10-325 MG tablet Take 1 tablet by mouth 2 (two) times daily. 04/27/16  Yes [provider]  tamsulosin (FLOMAX) 0.4 MG CAPS capsule Take 0.8 mg by mouth daily.  05/01/18  Yes [provider]    Family History Family History  Problem Relation Age of Onset   Breast cancer Sister     Social History Social History   Tobacco Use   Smoking status: Never Smoker   Smokeless tobacco: Never Used  Substance Use Topics   Alcohol use: Not Currently   Drug use: Never     Allergies   Ivp dye [iodinated diagnostic agents]   Review of Systems Review of Systems All other systems negative except as documented in the HPI. All pertinent positives and negatives as reviewed in the HPI.  Physical Exam Updated Vital Signs BP 121/60 (BP Location: Right Arm)    Pulse (!) 52    Temp 98.2 F (36.8 C) (Oral)    Resp 18    Ht 5\' 5"  (1.651 m)    Wt 90.7 kg    SpO2 98%    BMI 33.28 kg/m   Physical Exam Vitals signs and nursing note reviewed.  Constitutional:      General: He is not in acute distress.    Appearance: He is well-developed.  HENT:     Head: Normocephalic and atraumatic.  Eyes:     Pupils: Pupils are equal, round, and reactive to light.  Pulmonary:     Effort: Pulmonary effort is normal.  Musculoskeletal:       Legs:  Skin:    General: Skin is warm and dry.  Neurological:     Mental Status: He is alert and oriented to person, place, and time.      ED Treatments / Results  Labs (all labs ordered are listed, but only abnormal results are displayed) Labs Reviewed  BASIC METABOLIC PANEL - Abnormal; Notable for the  following components:      Result Value   Glucose, Bld 114 (*)    All other components within normal limits  CBC WITH DIFFERENTIAL/PLATELET - Abnormal; Notable for the following components:   Platelets 147 (*)    All other components within normal limits    EKG None  Radiology Vas [place] Lower Extremity Venous (dvt) (only [facility] & Wl 7a-7p)  Result Date: 02/09/2019  Lower Venous Study Indications: Edema.  Performing Technologist: Abram Sander RVS  Examination Guidelines: A complete evaluation includes B-mode imaging, spectral Doppler, color Doppler, and power Doppler as needed of all accessible portions of each vessel. Bilateral testing is considered an integral part of a complete examination. Limited examinations for reoccurring indications may be performed as noted.  +---------+---------------+---------+-----------+----------+-------+  RIGHT     Compressibility Phasicity Spontaneity Properties Summary  +---------+---------------+---------+-----------+----------+-------+  CFV       Full            Yes       Yes                             +---------+---------------+---------+-----------+----------+-------+  SFJ       Full                                                      +---------+---------------+---------+-----------+----------+-------+  FV Prox   Full                                                      +---------+---------------+---------+-----------+----------+-------+  FV Mid    Full                                                      +---------+---------------+---------+-----------+----------+-------+  FV Distal Full                                                      +---------+---------------+---------+-----------+----------+-------+  PFV       Full                                                      +---------+---------------+---------+-----------+----------+-------+  POP       Full            Yes       Yes                              +---------+---------------+---------+-----------+----------+-------+  PTV       Full                                                      +---------+---------------+---------+-----------+----------+-------+  PERO      Full                                                      +---------+---------------+---------+-----------+----------+-------+   +----+---------------+---------+-----------+----------+-------+  LEFT Compressibility Phasicity Spontaneity Properties Summary  +----+---------------+---------+-----------+----------+-------+  CFV  Full            Yes       Yes                             +----+---------------+---------+-----------+----------+-------+     Summary: Right: There is no evidence of deep vein thrombosis in the lower extremity. No cystic structure found in the popliteal fossa. Left: No evidence of common femoral vein obstruction.  *See table(s) above for measurements and observations. Electronically signed by Monica Martinez MD on 02/09/2019 at 12:18:08 PM.    Final     Procedures Procedures (including critical care time)  Medications Ordered in ED Medications - No data to display   Initial Impression / Assessment and Plan / ED Course  I have reviewed the triage vital signs and the nursing notes.  Pertinent labs & imaging results that were available during my care of the patient were reviewed by me and considered in my medical decision making (see chart for details).        Advised patient this could be a vasculitis as a result of the infection.  Have advised him to continue the medications.  Told to follow-up with his doctor.  Patient agrees to the plan and all questions were answered.  Asked patient to return here as needed.  Final Clinical Impressions(s) / ED Diagnoses   Final diagnoses:  Vasculitis of skin  Cellulitis of right leg    ED Discharge Orders    None       Dalia Heading, PA-C 02/10/19 1445    Malvin Johns, MD 02/11/19 1505

## 2019-02-11 DIAGNOSIS — M1612 Unilateral primary osteoarthritis, left hip: Secondary | ICD-10-CM | POA: Diagnosis not present

## 2019-02-12 DIAGNOSIS — L309 Dermatitis, unspecified: Secondary | ICD-10-CM | POA: Diagnosis not present

## 2019-02-12 DIAGNOSIS — I10 Essential (primary) hypertension: Secondary | ICD-10-CM | POA: Diagnosis not present

## 2019-02-15 DIAGNOSIS — N400 Enlarged prostate without lower urinary tract symptoms: Secondary | ICD-10-CM | POA: Diagnosis not present

## 2019-02-15 DIAGNOSIS — N200 Calculus of kidney: Secondary | ICD-10-CM | POA: Diagnosis not present

## 2019-02-26 DIAGNOSIS — S30812A Abrasion of penis, initial encounter: Secondary | ICD-10-CM | POA: Diagnosis not present

## 2019-03-06 DIAGNOSIS — M79673 Pain in unspecified foot: Secondary | ICD-10-CM | POA: Diagnosis not present

## 2019-03-06 DIAGNOSIS — G894 Chronic pain syndrome: Secondary | ICD-10-CM | POA: Diagnosis not present

## 2019-03-06 DIAGNOSIS — M47816 Spondylosis without myelopathy or radiculopathy, lumbar region: Secondary | ICD-10-CM | POA: Diagnosis not present

## 2019-03-06 DIAGNOSIS — M169 Osteoarthritis of hip, unspecified: Secondary | ICD-10-CM | POA: Diagnosis not present

## 2019-03-12 ENCOUNTER — Other Ambulatory Visit: Payer: Self-pay | Admitting: Orthopedic Surgery

## 2019-03-20 ENCOUNTER — Ambulatory Visit (INDEPENDENT_AMBULATORY_CARE_PROVIDER_SITE_OTHER): Payer: Medicare Other | Admitting: Physician Assistant

## 2019-03-20 ENCOUNTER — Other Ambulatory Visit: Payer: Self-pay

## 2019-03-20 ENCOUNTER — Encounter: Payer: Self-pay | Admitting: Physician Assistant

## 2019-03-20 DIAGNOSIS — M65341 Trigger finger, right ring finger: Secondary | ICD-10-CM

## 2019-03-20 MED ORDER — METHYLPREDNISOLONE ACETATE 40 MG/ML IJ SUSP
20.0000 mg | INTRAMUSCULAR | Status: AC | PRN
  Administered 2019-03-20: 20 mg

## 2019-03-20 MED ORDER — LIDOCAINE HCL 1 % IJ SOLN
0.5000 mL | INTRAMUSCULAR | Status: AC | PRN
  Administered 2019-03-20: .5 mL

## 2019-03-20 NOTE — Progress Notes (Signed)
   Procedure Note  Patient: Franklin Fisher             Date of Birth: 09-May-1941           MRN: [id number]             Visit Date: 03/20/2019 HPI: Mr.Horstman returns today due to his right ring finger triggering.  He states he wants to discuss surgery or other treatment options for this.  He was going to try very conservative treatment and apply diclofenac to the area but he states he has not done that.  Review of systems: No fevers chills shortness of breath chest pain  Right hand ring finger no active triggering.  Does have tenderness and a palpable nodule over the right ring finger A1 pulley region.  No other triggering throughout the hand of the other fingers. Procedures: Visit Diagnoses: Trigger finger, right ring finger  Hand/UE Inj: R ring A1 for trigger finger on 03/20/2019 9:34 AM Details: 25 G needle, volar approach Medications: 0.5 mL lidocaine 1 %; 20 mg methylPREDNISolone acetate 40 MG/ML Consent was given by the patient. Immediately prior to procedure a time out was called to verify the correct patient, procedure, equipment, support staff and site/side marked as required. Patient was prepped and draped in the usual sterile fashion.     Plan: He can follow-up on an as-needed basis in regards to the right trigger finger.  Did discuss surgery with him but due to the fact that he is having no active triggering at this time is felt that he has had no conservative treatment and therefore injection was given today.  Patient tolerated the injection well without any adverse effects.

## 2019-03-22 ENCOUNTER — Other Ambulatory Visit: Payer: Self-pay | Admitting: Orthopedic Surgery

## 2019-03-22 NOTE — Patient Instructions (Addendum)
Franklin Fisher    Your procedure is scheduled on: Monday 04/01/2019   Report to [facility] Main  Entrance              Report to Short Stay at [facility] TEST ON_6/11____ @__2 :00 pm_____, THIS TEST MUST BE DONE BEFORE SURGERY, COME TO [facility].    Call this number if you have problems the morning of surgery [phone]    Remember: Do not eat food : after midnight!              NO SOLID FOOD AFTER MIDNIGHT THE NIGHT PRIOR TO SURGERY. NOTHING BY MOUTH EXCEPT CLEAR LIQUIDS UNTIL 0430 am.               PLEASE FINISH ENSURE DRINK PER SURGEON ORDER  WHICH NEEDS TO BE COMPLETED AT  0430 am.   CLEAR LIQUID DIET   Foods Allowed                                                                     Foods Excluded  Coffee and tea, regular and decaf                             liquids that you cannot  Plain Jell-O in any flavor                                             see through such as: Fruit ices (not with fruit pulp)                                     milk, soups, orange juice  Iced Popsicles                                    All solid food Carbonated beverages, regular and diet                                    Cranberry, grape and apple juices Sports drinks like Gatorade Lightly seasoned clear broth or consume(fat free) Sugar, honey syrup  Sample Menu Breakfast                                Lunch                                     Supper Cranberry juice                    Beef broth  Chicken broth Jell-O                                     Grape juice                           Apple juice Coffee or tea                        Jell-O                                      Popsicle                                                Coffee or tea                        Coffee or  tea  _____________________________________________________________________                 BRUSH YOUR TEETH MORNING OF SURGERY AND RINSE YOUR MOUTH OUT, NO CHEWING GUM CANDY OR MINTS.     Take these medicines the morning of surgery with A SIP OF WATER: Atenolol (Tenormin), Tamulosin (Flomax),              use Albuterol inhaler if needed and bring inhaler with you to the hospital                                 You may not have any metal on your body including hair pins and              piercings  Do not wear jewelry, make-up, lotions, powders or perfumes, deodorant             Men may shave face and neck.   Do not bring valuables to the hospital. [place].  Contacts, dentures or bridgework may not be worn into surgery.  Leave suitcase in the car. After surgery it may be brought to your room.                  Please read over the following fact sheets you were given: _____________________________________________________________________             [facility] - Preparing for Surgery Before surgery, you can play an important role.  Because skin is not sterile, your skin needs to be as free of germs as possible.  You can reduce the number of germs on your skin by washing with CHG (chlorahexidine gluconate) soap before surgery.  CHG is an antiseptic cleaner which kills germs and bonds with the skin to continue killing germs even after washing. Please DO NOT use if you have an allergy to CHG or antibacterial soaps.  If your skin becomes reddened/irritated stop using the CHG and inform your nurse when you arrive at Short Stay. Do not shave (including legs and underarms) for at least 48 hours prior to the first CHG shower.  You may shave your face/neck. Please follow these instructions carefully:  1.  Shower with CHG Soap the night before surgery and the  morning of Surgery.  2.  If you choose to wash your hair, wash your hair first as  usual with your  normal  shampoo.  3.  After you shampoo, rinse your hair and body thoroughly to remove the  shampoo.                                        4.  Use CHG as you would any other liquid soap.  You can apply chg directly  to the skin and wash                       Gently with a scrungie or clean washcloth.  5.  Apply the CHG Soap to your body ONLY FROM THE NECK DOWN.   Do not use on face/ open                           Wound or open sores. Avoid contact with eyes, ears mouth and genitals (private parts).                       Wash face,  Genitals (private parts) with your normal soap.             6.  Wash thoroughly, paying special attention to the area where your surgery  will be performed.  7.  Thoroughly rinse your body with warm water from the neck down.  8.  DO NOT shower/wash with your normal soap after using and rinsing off  the CHG Soap.                9.  Pat yourself dry with a clean towel.            10.  Wear clean pajamas.            11.  Place clean sheets on your bed the night of your first shower and do not  sleep with pets. Day of Surgery : Do not apply any lotions/deodorants the morning of surgery.  Please wear clean clothes to the hospital/surgery center.  FAILURE TO FOLLOW THESE INSTRUCTIONS MAY RESULT IN THE CANCELLATION OF YOUR SURGERY PATIENT SIGNATURE_________________________________  NURSE SIGNATURE__________________________________  ________________________________________________________________________   Franklin Fisher  An incentive spirometer is a tool that can help keep your lungs clear and active. This tool measures how well you are filling your lungs with each breath. Taking long deep breaths may help reverse or decrease the chance of developing breathing (pulmonary) problems (especially infection) following:  A long period of time when you are unable to move or be active. BEFORE THE PROCEDURE   If the spirometer includes an indicator  to show your best effort, your nurse or respiratory therapist will set it to a desired goal.  If possible, sit up straight or lean slightly forward. Try not to slouch.  Hold the incentive spirometer in an upright position. INSTRUCTIONS FOR USE  1. Sit on the edge of your bed if possible, or sit up as far as you can in bed or on a chair. 2. Hold the incentive spirometer in an upright position. 3. Breathe out normally. 4. Place the mouthpiece in your mouth and seal your  lips tightly around it. 5. Breathe in slowly and as deeply as possible, raising the piston or the ball toward the top of the column. 6. Hold your breath for 3-5 seconds or for as long as possible. Allow the piston or ball to fall to the bottom of the column. 7. Remove the mouthpiece from your mouth and breathe out normally. 8. Rest for a few seconds and repeat Steps 1 through 7 at least 10 times every 1-2 hours when you are awake. Take your time and take a few normal breaths between deep breaths. 9. The spirometer may include an indicator to show your best effort. Use the indicator as a goal to work toward during each repetition. 10. After each set of 10 deep breaths, practice coughing to be sure your lungs are clear. If you have an incision (the cut made at the time of surgery), support your incision when coughing by placing a pillow or rolled up towels firmly against it. Once you are able to get out of bed, walk around indoors and cough well. You may stop using the incentive spirometer when instructed by your caregiver.  RISKS AND COMPLICATIONS  Take your time so you do not get dizzy or light-headed.  If you are in pain, you may need to take or ask for pain medication before doing incentive spirometry. It is harder to take a deep breath if you are having pain. AFTER USE  Rest and breathe slowly and easily.  It can be helpful to keep track of a log of your progress. Your caregiver can provide you with a simple table to help  with this. If you are using the spirometer at home, follow these instructions: [place] IF:   You are having difficultly using the spirometer.  You have trouble using the spirometer as often as instructed.  Your pain medication is not giving enough relief while using the spirometer.  You develop fever of 100.5 F (38.1 C) or higher. SEEK IMMEDIATE MEDICAL CARE IF:   You cough up bloody sputum that had not been present before.  You develop fever of 102 F (38.9 C) or greater.  You develop worsening pain at or near the incision site. MAKE SURE YOU:   Understand these instructions.  Will watch your condition.  Will get help right away if you are not doing well or get worse. Document Released: 02/13/2007 Document Revised: 12/26/2011 Document Reviewed: 04/16/2007 Exit[employer] Patient Information 2014 Exit[employer], [place].   ________________________________________________________________________  WHAT IS A BLOOD TRANSFUSION? Blood Transfusion Information  A transfusion is the replacement of blood or some of its parts. Blood is made up of multiple cells which provide different functions.  Red blood cells carry oxygen and are used for blood loss replacement.  White blood cells fight against infection.  Platelets control bleeding.  Plasma helps clot blood.  Other blood products are available for specialized needs, such as hemophilia or other clotting disorders. BEFORE THE TRANSFUSION  Who gives blood for transfusions?   Healthy volunteers who are fully evaluated to make sure their blood is safe. This is blood bank blood. Transfusion therapy is the safest it has ever been in the practice of medicine. Before blood is taken from a donor, a complete history is taken to make sure that person has no history of diseases nor engages in risky social behavior (examples are intravenous drug use or sexual activity with multiple partners). The donor's travel history is screened to  minimize risk of transmitting infections, such as  malaria. The donated blood is tested for signs of infectious diseases, such as HIV and hepatitis. The blood is then tested to be sure it is compatible with you in order to minimize the chance of a transfusion reaction. If you or a relative donates blood, this is often done in anticipation of surgery and is not appropriate for emergency situations. It takes many days to process the donated blood. RISKS AND COMPLICATIONS Although transfusion therapy is very safe and saves many lives, the main dangers of transfusion include:   Getting an infectious disease.  Developing a transfusion reaction. This is an allergic reaction to something in the blood you were given. Every precaution is taken to prevent this. The decision to have a blood transfusion has been considered carefully by your caregiver before blood is given. Blood is not given unless the benefits outweigh the risks. AFTER THE TRANSFUSION  Right after receiving a blood transfusion, you will usually feel much better and more energetic. This is especially true if your red blood cells have gotten low (anemic). The transfusion raises the level of the red blood cells which carry oxygen, and this usually causes an energy increase.  The nurse administering the transfusion will monitor you carefully for complications. HOME CARE INSTRUCTIONS  No special instructions are needed after a transfusion. You may find your energy is better. Speak with your caregiver about any limitations on activity for underlying diseases you may have. SEEK MEDICAL CARE IF:   Your condition is not improving after your transfusion.  You develop redness or irritation at the intravenous (IV) site. SEEK IMMEDIATE MEDICAL CARE IF:  Any of the following symptoms occur over the next 12 hours:  Shaking chills.  You have a temperature by mouth above 102 F (38.9 C), not controlled by medicine.  Chest, back, or muscle  pain.  People around you feel you are not acting correctly or are confused.  Shortness of breath or difficulty breathing.  Dizziness and fainting.  You get a rash or develop hives.  You have a decrease in urine output.  Your urine turns a dark color or changes to pink, red, or brown. Any of the following symptoms occur over the next 10 days:  You have a temperature by mouth above 102 F (38.9 C), not controlled by medicine.  Shortness of breath.  Weakness after normal activity.  The white part of the eye turns yellow (jaundice).  You have a decrease in the amount of urine or are urinating less often.  Your urine turns a dark color or changes to pink, red, or brown. Document Released: 09/30/2000 Document Revised: 12/26/2011 Document Reviewed: 05/19/2008 [facility] Patient Information 2014 [place], [place].  _______________________________________________________________________

## 2019-03-22 NOTE — Care Plan (Signed)
Spoke with patient prior to surgery. He plans to discharge to home with friend to help post op. Rolling walker and 3n1 have been ordered for him.  He will discharge to the following address.     [address], [place], [place] [address].   Patient and MD are aware of plan and agreeable. Choice offered.    Ladell Heads, [place]

## 2019-03-25 ENCOUNTER — Ambulatory Visit (HOSPITAL_COMMUNITY)
Admission: RE | Admit: 2019-03-25 | Discharge: 2019-03-25 | Disposition: A | Discharge status: Home / Self Care | Payer: Medicare Other | Source: Ambulatory Visit | Attending: Orthopedic Surgery | Admitting: Orthopedic Surgery

## 2019-03-25 ENCOUNTER — Encounter (HOSPITAL_COMMUNITY): Payer: Self-pay

## 2019-03-25 ENCOUNTER — Encounter (HOSPITAL_COMMUNITY)
Admission: RE | Admit: 2019-03-25 | Discharge: 2019-03-25 | Disposition: A | Discharge status: Home / Self Care | Payer: Medicare Other | Source: Ambulatory Visit | Attending: Orthopedic Surgery | Admitting: Orthopedic Surgery

## 2019-03-25 ENCOUNTER — Other Ambulatory Visit: Payer: Self-pay

## 2019-03-25 DIAGNOSIS — Z01818 Encounter for other preprocedural examination: Secondary | ICD-10-CM | POA: Diagnosis not present

## 2019-03-25 DIAGNOSIS — R001 Bradycardia, unspecified: Secondary | ICD-10-CM | POA: Insufficient documentation

## 2019-03-25 DIAGNOSIS — J9811 Atelectasis: Secondary | ICD-10-CM | POA: Diagnosis not present

## 2019-03-25 DIAGNOSIS — Z01811 Encounter for preprocedural respiratory examination: Secondary | ICD-10-CM | POA: Diagnosis not present

## 2019-03-25 DIAGNOSIS — I440 Atrioventricular block, first degree: Secondary | ICD-10-CM | POA: Insufficient documentation

## 2019-03-25 HISTORY — DX: Personal history of urinary calculi: Z87.442

## 2019-03-25 HISTORY — DX: Headache, unspecified: R51.9

## 2019-03-25 HISTORY — DX: Cerebral infarction, unspecified: I63.9

## 2019-03-25 LAB — PROTIME-INR
INR: 1 (ref 0.8–1.2)
Prothrombin Time: 12.8 seconds (ref 11.4–15.2)

## 2019-03-25 LAB — SURGICAL PCR SCREEN
MRSA, PCR: NEGATIVE
Staphylococcus aureus: NEGATIVE

## 2019-03-25 LAB — CBC WITH DIFFERENTIAL/PLATELET
Abs Immature Granulocytes: 0.04 10*3/uL (ref 0.00–0.07)
Basophils Absolute: 0.1 10*3/uL (ref 0.0–0.1)
Basophils Relative: 1 %
Eosinophils Absolute: 0.2 10*3/uL (ref 0.0–0.5)
Eosinophils Relative: 3 %
HCT: 48.1 % (ref 39.0–52.0)
Hemoglobin: 15.9 g/dL (ref 13.0–17.0)
Immature Granulocytes: 1 %
Lymphocytes Relative: 30 %
Lymphs Abs: 2.3 10*3/uL (ref 0.7–4.0)
MCH: 30.5 pg (ref 26.0–34.0)
MCHC: 33.1 g/dL (ref 30.0–36.0)
MCV: 92.3 fL (ref 80.0–100.0)
Monocytes Absolute: 0.6 10*3/uL (ref 0.1–1.0)
Monocytes Relative: 8 %
Neutro Abs: 4.3 10*3/uL (ref 1.7–7.7)
Neutrophils Relative %: 57 %
Platelets: 176 10*3/uL (ref 150–400)
RBC: 5.21 MIL/uL (ref 4.22–5.81)
RDW: 12.7 % (ref 11.5–15.5)
WBC: 7.5 10*3/uL (ref 4.0–10.5)
nRBC: 0 % (ref 0.0–0.2)

## 2019-03-25 LAB — BASIC METABOLIC PANEL
Anion gap: 6 (ref 5–15)
BUN: 20 mg/dL (ref 8–23)
CO2: 27 mmol/L (ref 22–32)
Calcium: 9.1 mg/dL (ref 8.9–10.3)
Chloride: 106 mmol/L (ref 98–111)
Creatinine, Ser: 1.03 mg/dL (ref 0.61–1.24)
GFR calc Af Amer: >60 mL/min (ref >60)
GFR calc non Af Amer: >60 mL/min (ref >60)
Glucose, Bld: 106 mg/dL — ABNORMAL HIGH (ref 70–99)
Potassium: 4.5 mmol/L (ref 3.5–5.1)
Sodium: 139 mmol/L (ref 135–145)

## 2019-03-25 LAB — URINALYSIS, ROUTINE W REFLEX MICROSCOPIC
Bilirubin Urine: NEGATIVE
Glucose, UA: NEGATIVE mg/dL
Hgb urine dipstick: NEGATIVE
Ketones, ur: NEGATIVE mg/dL
Leukocytes,Ua: NEGATIVE
Nitrite: NEGATIVE
Protein, ur: NEGATIVE mg/dL
Specific Gravity, Urine: 1.015 (ref 1.005–1.030)
pH: 5 (ref 5.0–8.0)

## 2019-03-25 LAB — ABO/RH: ABO/RH(D): O NEG

## 2019-03-25 LAB — APTT: aPTT: 31 seconds (ref 24–36)

## 2019-03-25 NOTE — Progress Notes (Signed)
Konrad Felix PA   Please review EKG. Final is pending. Pt has a PCP Dr. Orland Mustard. No cardiologist. He has Sleep apnea and will bring his mask and tubing to the hospital as well as his inhaler DOS. He had no symptoms at PAT

## 2019-03-27 ENCOUNTER — Telehealth: Payer: Self-pay | Admitting: Cardiology

## 2019-03-27 NOTE — Progress Notes (Signed)
Virtual Visit via Video Note   This visit type was conducted due to national recommendations for restrictions regarding the COVID-19 Pandemic (e.g. social distancing) in an effort to limit this patient's exposure and mitigate transmission in our community.  Due to his co-morbid illnesses, this patient is at least at moderate risk for complications without adequate follow up.  This format is felt to be most appropriate for this patient at this time.  All issues noted in this document were discussed and addressed.  A limited physical exam was performed with this format.  Please refer to the patient's chart for his consent to telehealth for [facility].   Date:  03/28/2019   ID:  Franklin Fisher, DOB 09/14/1941, MRN [id number]  Patient Location: Home Provider Location: Office  PCP:  London Pepper, MD  Cardiologist:  No primary care provider on file.  Electrophysiologist:  None   Evaluation Performed:  Consultation - Kooper Godshall was referred by [facility] prior to surgery for the evaluation of abnormal EKG.  Chief Complaint: My EKG was abnormal  History of Present Illness:    Franklin Fisher is a 78 y.o. male with an abnormal preoperative EKG for THA on 04/01/19.Franklin Fisher  Here today at the request [place] for cardiac clearance prior to arthroplasty secondary to abnormal EKG.   EKG 03/25/2019 independently reviewed by me sinus bradycardia with first degree AV block, LAD and poor r wave progression, cannot rule out anterior MI..  The patient does not have symptoms concerning for COVID-19 infection (fever, chills, cough, or new shortness of breath).   He is scheduled for elective total hip arthroplasty on Monday.  His preoperative EKG is detailed above and I looked at previous EKGs from September 2019 that are similar.  He tells me 10 to 15 years ago he had a stress test performed that was unremarkable and has no known history of congenital rheumatic heart disease atrial  fibrillation heart failure or coronary artery disease.  Despite the limitations of hip pain he is quite active man exercise tolerance is greater than 5-6 METS and he has no chest pain shortness of breath palpitations syncope or edema.  To resolve the issue of abnormal EKG would really do her best to get an echocardiogram done today or tomorrow and for the time being we will leave him on the operative schedule my expectation is that he has not had a previous myocardial infarction we discussed whether or not he needed to have an ischemia evaluation he has no chest pain and I would not perform one preoperatively and the patient is in agreement.  He has hypertension mild well-controlled on a beta-blocker he has sleep apnea is compliant with and will continue his CPAP and no history of diabetes or hyperlipidemia his preoperative labs show normal renal liver function and CBC. Past Medical History:  Diagnosis Date  . BPH (benign prostatic hyperplasia)   . Headache    stopped at age 51  . History of kidney stones    passes 1-2 per year  . Hypertension 2000   Dr. Orland Mustard  . Stroke [facility])    TIA cat scans and MRI were WNL  . TIA (transient ischemic attack)    Past Surgical History:  Procedure Laterality Date  . CATARACT EXTRACTION, BILATERAL Bilateral 2015  . CHOLECYSTECTOMY    . HERNIA REPAIR     bi lat inguinal  . HERNIA REPAIR    . TONSILLECTOMY     age 72     Current  Meds  Medication Sig  . albuterol (PROVENTIL HFA;VENTOLIN HFA) 108 (90 Base) MCG/ACT inhaler Inhale 2 puffs into the lungs every 6 (six) hours as needed for wheezing or shortness of breath.  Franklin Fisher atenolol (TENORMIN) 100 MG tablet Take 100 mg by mouth daily.  . clotrimazole-betamethasone (LOTRISONE) cream Apply 1 application topically daily as needed for itching.  Franklin Fisher ketoconazole (NIZORAL) 2 % shampoo Apply 1 application topically daily as needed for irritation.   Franklin Fisher oxyCODONE-acetaminophen (PERCOCET) 10-325 MG tablet Take 1 tablet by  mouth 2 (two) times daily.  . tamsulosin (FLOMAX) 0.4 MG CAPS capsule Take 0.8 mg by mouth daily.    Current Facility-Administered Medications for the 03/28/19 encounter (Telemedicine) with Richardo Priest, MD  Medication  . diclofenac sodium (VOLTAREN) 1 % transdermal gel 2 g     Allergies:   Ivp dye [iodinated diagnostic agents]   Social History   Tobacco Use  . Smoking status: Never Smoker  . Smokeless tobacco: Never Used  Substance Use Topics  . Alcohol use: Not Currently  . Drug use: Never     Family Hx: The patient's family history includes Atrial fibrillation in his mother; Breast cancer in his sister; CAD in his father; Congestive Heart Failure in his mother.  ROS:  Review of Systems  Constitution: Negative.  HENT: Negative.   Eyes: Negative.   Cardiovascular: Negative.   Respiratory: Positive for snoring (on CPAP).   Endocrine: Negative.   Hematologic/Lymphatic: Negative.   Skin: Negative.   Musculoskeletal: Positive for joint pain.  Gastrointestinal: Negative.   Genitourinary: Negative.   Neurological: Negative.   Psychiatric/Behavioral: Negative.   Allergic/Immunologic: Negative.    Please see the history of present illness.     All other systems reviewed and are negative.   Prior CV studies:   The following studies were reviewed today:    Labs/Other Tests and Data Reviewed:    EKG:  EKG detailed in history compared with previous EKG September 2019 unchanged  Recent Labs: 07/07/2018: ALT 22 03/25/2019: BUN 20; Creatinine, Ser 1.03; Hemoglobin 15.9; Platelets 176; Potassium 4.5; Sodium 139   Recent Lipid Panel No results found for: CHOL, TRIG, HDL, CHOLHDL, LDLCALC, LDLDIRECT  Wt Readings from Last 3 Encounters:  03/28/19 200 lb (90.7 kg)  03/25/19 208 lb 4.8 oz (94.5 kg)  02/09/19 200 lb (90.7 kg)     Objective:    Vital Signs:  Ht 5\' 5"  (1.651 m)   Wt 200 lb (90.7 kg)   BMI 33.28 kg/m    VITAL SIGNS:  reviewed GEN:  no acute distress  EYES:  sclerae anicteric, EOMI - Extraocular Movements Intact RESPIRATORY:  normal respiratory effort, symmetric expansion CARDIOVASCULAR:  no peripheral edema SKIN:  no rash, lesions or ulcers. MUSCULOSKELETAL:  no obvious deformities. NEURO:  alert and oriented x 3, no obvious focal deficit PSYCH:  normal affect  ASSESSMENT & PLAN:    1. Abnormal EKG has a poor predictive value of seen more commonly in normal scan abnormals but in view of the nature of surgery and the patient's concerns we will undergo an echocardiogram if at all possible today or tomorrow and stay on the operative schedule expectation is that should be reassuring if he has findings of cardiomyopathy or previous myocardial infarction no need further evaluation. 2. Preoperative cardiovascular exam, surgery is elective intermediate risk and the patient has no known heart disease.  His exercise tolerance is well-preserved in the clinic with concerns here is sleep apnea and hypertension both well controlled  and his EKG abnormality with plan as outlined above my expectation is his echocardiogram be reassuring and he is optimized for his planned surgery postoperatively go to a monitored bed check EKG postoperative day 1 continue his beta-blocker and if there is any concerns or issues consult heart care 3. Essential hypertension stable well-controlled continue his long-term beta-blocker therapy 4. CPAP stable he will require this in hospital and the surgical team is aware of the needs  COVID-19 Education: The signs and symptoms of COVID-19 were discussed with the patient and how to seek care for testing (follow up with PCP or arrange E-visit).  The importance of social distancing was discussed today.  Time:   Today, I have spent 60 minutes with the patient with telehealth technology discussing the above problems.  This includes previsit evaluation of medical records independent review of present previous EKG labs and shared decision  making with the patient about the appropriate approach preoperative cardiovascular optimization and evaluation of his EKG abnormality   Medication Adjustments/Labs and Tests Ordered: Current medicines are reviewed at length with the patient today.  Concerns regarding medicines are outlined above.   Tests Ordered: No orders of the defined types were placed in this encounter.   Medication Changes: No orders of the defined types were placed in this encounter.   Disposition:  Follow up prn  Signed, Shirlee More, MD  03/28/2019 8:36 AM    [place]

## 2019-03-27 NOTE — Telephone Encounter (Signed)
Virtual Visit Pre-Appointment Phone Call  "(Name), I am calling you today to discuss your upcoming appointment. We are currently trying to limit exposure to the virus that causes COVID-19 by seeing patients at home rather than in the office."  1. "What is the BEST phone number to call the day of the visit?" - include this in appointment notes  2. Do you have or have access to (through a family member/friend) a smartphone with video capability that we can use for your visit?" a. If yes - list this number in appt notes as cell (if different from BEST phone #) and list the appointment type as a VIDEO visit in appointment notes b. If no - list the appointment type as a PHONE visit in appointment notes  Confirm consent - "In the setting of the current Covid19 crisis, you are scheduled for a (phone or video) visit with your provider on (date) at (time).  Just as we do with many in-office visits, in order for you to participate in this visit, we must obtain consent.  If you'd like, I can send this to your mychart (if signed up) or email for you to review.  Otherwise, I can obtain your verbal consent now.  All virtual visits are billed to your insurance company just like a normal visit would be.  By agreeing to a virtual visit, we'd like you to understand that the technology does not allow for your provider to perform an examination, and thus may limit your provider's ability to fully assess your condition. If your provider identifies any concerns that need to be evaluated in person, we will make arrangements to do so.  Finally, though the technology is pretty good, we cannot assure that it will always work on either your or our end, and in the setting of a video visit, we may have to convert it to a phone-only visit.  In either situation, we cannot ensure that we have a secure connection.  Are you willing to proceed?" STAFF: Did the patient verbally acknowledge consent to telehealth visit? Document  YES/NO here: YES 3. Advise patient to be prepared - "Two hours prior to your appointment, go ahead and check your blood pressure, pulse, oxygen saturation, and your weight (if you have the equipment to check those) and write them all down. When your visit starts, your provider will ask you for this information. If you have an Apple Watch or Kardia device, please plan to have heart rate information ready on the day of your appointment. Please have a pen and paper handy nearby the day of the visit as well."  4. Give patient instructions for MyChart download to smartphone OR Doximity/Doxy.me as below if video visit (depending on what platform provider is using)  5. Inform patient they will receive a phone call 15 minutes prior to their appointment time (may be from unknown caller ID) so they should be prepared to answer    TELEPHONE CALL NOTE  Franklin Fisher has been deemed a candidate for a follow-up tele-health visit to limit community exposure during the Covid-19 pandemic. I spoke with the patient via phone to ensure availability of phone/video source, confirm preferred email & phone number, and discuss instructions and expectations.  I reminded Franklin Fisher to be prepared with any vital sign and/or heart rhythm information that could potentially be obtained via home monitoring, at the time of his visit. I reminded Franklin Fisher to expect a phone call prior to his visit.  Janine Limbo  03/27/2019 8:15 AM   INSTRUCTIONS FOR DOWNLOADING THE MYCHART APP TO SMARTPHONE  - The patient must first make sure to have activated MyChart and know their login information - If Apple, go to [employer] and type in MyChart in the search bar and download the app. If Android, ask patient to go to [employer] and type in [place] in the search bar and download the app. The app is free but as with any other app downloads, their phone may require them to verify saved payment information or Apple/Android  password.  - The patient will need to then log into the app with their MyChart username and password, and select [facility] as their healthcare provider to link the account. When it is time for your visit, go to the MyChart app, find appointments, and click Begin Video Visit. Be sure to Select Allow for your device to access the Microphone and Camera for your visit. You will then be connected, and your provider will be with you shortly.  **If they have any issues connecting, or need assistance please contact MyChart service desk [phone])**  **If using a computer, in order to ensure the best quality for their visit they will need to use either of the following Internet Browsers: [employer], or Google Chrome**  IF USING DOXIMITY or DOXY.ME - The patient will receive a link just prior to their visit by text.     FULL LENGTH CONSENT FOR TELE-HEALTH VISIT   I hereby voluntarily request, consent and authorize [facility] and its employed or contracted physicians, physician assistants, nurse practitioners or other licensed health care professionals (the Practitioner), to provide me with telemedicine health care services (the Services") as deemed necessary by the treating Practitioner. I acknowledge and consent to receive the Services by the Practitioner via telemedicine. I understand that the telemedicine visit will involve communicating with the Practitioner through live audiovisual communication technology and the disclosure of certain medical information by electronic transmission. I acknowledge that I have been given the opportunity to request an in-person assessment or other available alternative prior to the telemedicine visit and am voluntarily participating in the telemedicine visit.  I understand that I have the right to withhold or withdraw my consent to the use of telemedicine in the course of my care at any time, without affecting my right to future care or treatment,  and that the Practitioner or I may terminate the telemedicine visit at any time. I understand that I have the right to inspect all information obtained and/or recorded in the course of the telemedicine visit and may receive copies of available information for a reasonable fee.  I understand that some of the potential risks of receiving the Services via telemedicine include:   Delay or interruption in medical evaluation due to technological equipment failure or disruption;  Information transmitted may not be sufficient (e.g. poor resolution of images) to allow for appropriate medical decision making by the Practitioner; and/or   In rare instances, security protocols could fail, causing a breach of personal health information.  Furthermore, I acknowledge that it is my responsibility to provide information about my medical history, conditions and care that is complete and accurate to the best of my ability. I acknowledge that Practitioner's advice, recommendations, and/or decision may be based on factors not within their control, such as incomplete or inaccurate data provided by me or distortions of diagnostic images or specimens that may result from electronic transmissions. I understand that the practice of medicine  is not an Chief Strategy Officer and that Practitioner makes no warranties or guarantees regarding treatment outcomes. I acknowledge that I will receive a copy of this consent concurrently upon execution via email to the email address I last provided but may also request a printed copy by calling the office of [facility].    I understand that my insurance will be billed for this visit.   I have read or had this consent read to me.  I understand the contents of this consent, which adequately explains the benefits and risks of the Services being provided via telemedicine.   I have been provided ample opportunity to ask questions regarding this consent and the Services and have had my questions  answered to my satisfaction.  I give my informed consent for the services to be provided through the use of telemedicine in my medical care  By participating in this telemedicine visit I agree to the above.

## 2019-03-28 ENCOUNTER — Telehealth (INDEPENDENT_AMBULATORY_CARE_PROVIDER_SITE_OTHER): Payer: Medicare Other | Admitting: Cardiology

## 2019-03-28 ENCOUNTER — Other Ambulatory Visit: Payer: Self-pay

## 2019-03-28 ENCOUNTER — Encounter: Payer: Self-pay | Admitting: Cardiology

## 2019-03-28 ENCOUNTER — Other Ambulatory Visit (HOSPITAL_COMMUNITY)
Admission: RE | Admit: 2019-03-28 | Discharge: 2019-03-28 | Disposition: A | Discharge status: Home / Self Care | Payer: Medicare Other | Source: Ambulatory Visit | Attending: Orthopedic Surgery | Admitting: Orthopedic Surgery

## 2019-03-28 VITALS — Ht 65.0 in | Wt 200.0 lb

## 2019-03-28 DIAGNOSIS — Z1159 Encounter for screening for other viral diseases: Secondary | ICD-10-CM | POA: Diagnosis not present

## 2019-03-28 DIAGNOSIS — Z01810 Encounter for preprocedural cardiovascular examination: Secondary | ICD-10-CM

## 2019-03-28 DIAGNOSIS — R9431 Abnormal electrocardiogram [ECG] [EKG]: Secondary | ICD-10-CM | POA: Diagnosis not present

## 2019-03-28 DIAGNOSIS — I10 Essential (primary) hypertension: Secondary | ICD-10-CM

## 2019-03-28 NOTE — Patient Instructions (Signed)
Medication Instructions:  Your physician recommends that you continue on your current medications as directed. Please refer to the Current Medication list given to you today.  If you need a refill on your cardiac medications before your next appointment, please call your pharmacy.   Lab work: None  If you have labs (blood work) drawn today and your tests are completely normal, you will receive your results only by: Marland Kitchen MyChart Message (if you have MyChart) OR . A paper copy in the mail If you have any lab test that is abnormal or we need to change your treatment, we will call you to review the results.  Testing/Procedures: Your physician has requested that you have an echocardiogram. Echocardiography is a painless test that uses sound waves to create images of your heart. It provides your doctor with information about the size and shape of your heart and how well your heart's chambers and valves are working. This procedure takes approximately one hour. There are no restrictions for this procedure. This will be completed at [facility] on Friday, 03/29/2019, at 12:30 pm.  Follow-Up: At [facility], you and your health needs are our priority.  As part of our continuing mission to provide you with exceptional heart care, we have created designated Provider Care Teams.  These Care Teams include your primary Cardiologist (physician) and Advanced Practice Providers (APPs -  Physician Assistants and Nurse Practitioners) who all work together to provide you with the care you need, when you need it. You will need a follow up appointment as needed if symptoms worsen or fail to improve.     Echocardiogram An echocardiogram is a procedure that uses painless sound waves (ultrasound) to produce an image of the heart. Images from an echocardiogram can provide important information about:  Signs of coronary artery disease (CAD).  Aneurysm detection. An aneurysm is a weak or damaged part of an  artery wall that bulges out from the normal force of blood pumping through the body.  Heart size and shape. Changes in the size or shape of the heart can be associated with certain conditions, including heart failure, aneurysm, and CAD.  Heart muscle function.  Heart valve function.  Signs of a past heart attack.  Fluid buildup around the heart.  Thickening of the heart muscle.  A tumor or infectious growth around the heart valves. Tell a health care provider about:  Any allergies you have.  All medicines you are taking, including vitamins, herbs, eye drops, creams, and over-the-counter medicines.  Any blood disorders you have.  Any surgeries you have had.  Any medical conditions you have.  Whether you are pregnant or may be pregnant. What are the risks? Generally, this is a safe procedure. However, problems may occur, including:  Allergic reaction to dye (contrast) that may be used during the procedure. What happens before the procedure? No specific preparation is needed. You may eat and drink normally. What happens during the procedure?   An IV tube may be inserted into one of your veins.  You may receive contrast through this tube. A contrast is an injection that improves the quality of the pictures from your heart.  A gel will be applied to your chest.  A wand-like tool (transducer) will be moved over your chest. The gel will help to transmit the sound waves from the transducer.  The sound waves will harmlessly bounce off of your heart to allow the heart images to be captured in real-time motion. The images will  be recorded on a computer. The procedure may vary among health care providers and hospitals. What happens after the procedure?  You may return to your normal, everyday life, including diet, activities, and medicines, unless your health care provider tells you not to do that. Summary  An echocardiogram is a procedure that uses painless sound waves  (ultrasound) to produce an image of the heart.  Images from an echocardiogram can provide important information about the size and shape of your heart, heart muscle function, heart valve function, and fluid buildup around your heart.  You do not need to do anything to prepare before this procedure. You may eat and drink normally.  After the echocardiogram is completed, you may return to your normal, everyday life, unless your health care provider tells you not to do that. This information is not intended to replace advice given to you by your health care provider. Make sure you discuss any questions you have with your health care provider. Document Released: 09/30/2000 Document Revised: 11/05/2016 Document Reviewed: 11/05/2016 Elsevier Interactive Patient Education  2019 [employer].

## 2019-03-29 ENCOUNTER — Ambulatory Visit (HOSPITAL_COMMUNITY)
Admission: RE | Admit: 2019-03-29 | Discharge: 2019-03-29 | Disposition: A | Discharge status: Home / Self Care | Payer: Medicare Other | Source: Ambulatory Visit | Attending: Cardiology | Admitting: Cardiology

## 2019-03-29 DIAGNOSIS — R9431 Abnormal electrocardiogram [ECG] [EKG]: Secondary | ICD-10-CM | POA: Insufficient documentation

## 2019-03-29 DIAGNOSIS — I10 Essential (primary) hypertension: Secondary | ICD-10-CM | POA: Diagnosis not present

## 2019-03-29 DIAGNOSIS — M1611 Unilateral primary osteoarthritis, right hip: Secondary | ICD-10-CM | POA: Diagnosis present

## 2019-03-29 DIAGNOSIS — Z01810 Encounter for preprocedural cardiovascular examination: Secondary | ICD-10-CM | POA: Insufficient documentation

## 2019-03-29 LAB — NOVEL CORONAVIRUS, NAA (HOSP ORDER, SEND-OUT TO REF LAB; TAT 18-24 HRS): SARS-CoV-2, NAA: NOT DETECTED

## 2019-03-29 NOTE — H&P (Signed)
TOTAL HIP ADMISSION H&P  Patient is admitted for right total hip arthroplasty.  Subjective:  Chief Complaint: right hip pain  HPI: Franklin Fisher, 78 y.o. male, has a history of pain and functional disability in the right hip(s) due to arthritis and patient has failed non-surgical conservative treatments for greater than 12 weeks to include NSAID's and/or analgesics, corticosteriod injections, use of assistive devices and activity modification.  Onset of symptoms was gradual starting 2 years ago with gradually worsening course since that time.The patient noted no past surgery on the right hip(s).  Patient currently rates pain in the right hip at 10 out of 10 with activity. Patient has night pain, worsening of pain with activity and weight bearing, pain that interfers with activities of daily living and pain with passive range of motion. Patient has evidence of periarticular osteophytes and joint space narrowing by imaging studies. This condition presents safety issues increasing the risk of falls.   There is no current active infection.  Patient Active Problem List   Diagnosis Date Noted  . Preop cardiovascular exam 03/28/2019  . Abnormal electrocardiogram (ECG) (EKG) 03/28/2019  . Essential hypertension 03/28/2019   Past Medical History:  Diagnosis Date  . BPH (benign prostatic hyperplasia)   . Headache    stopped at age 42  . History of kidney stones    passes 1-2 per year  . Hypertension 2000   Dr. Orland Mustard  . Stroke [facility])    TIA cat scans and MRI were WNL  . TIA (transient ischemic attack)     Past Surgical History:  Procedure Laterality Date  . CATARACT EXTRACTION, BILATERAL Bilateral 2015  . CHOLECYSTECTOMY    . HERNIA REPAIR     bi lat inguinal  . HERNIA REPAIR    . TONSILLECTOMY     age 66    Current Facility-Administered Medications  Medication Dose Route Frequency Provider Last Rate Last Dose  . diclofenac sodium (VOLTAREN) 1 % transdermal gel 2 g  2 g Topical QID  Pete Pelt, PA-C       Current Outpatient Medications  Medication Sig Dispense Refill Last Dose  . albuterol (PROVENTIL HFA;VENTOLIN HFA) 108 (90 Base) MCG/ACT inhaler Inhale 2 puffs into the lungs every 6 (six) hours as needed for wheezing or shortness of breath.     Marland Kitchen atenolol (TENORMIN) 100 MG tablet Take 100 mg by mouth daily.     . clotrimazole-betamethasone (LOTRISONE) cream Apply 1 application topically daily as needed for itching.     Marland Kitchen ketoconazole (NIZORAL) 2 % shampoo Apply 1 application topically daily as needed for irritation.      Marland Kitchen oxyCODONE-acetaminophen (PERCOCET) 10-325 MG tablet Take 1 tablet by mouth 2 (two) times daily.     . tamsulosin (FLOMAX) 0.4 MG CAPS capsule Take 0.8 mg by mouth daily.       Allergies  Allergen Reactions  . Ivp Dye [Iodinated Diagnostic Agents] Hives    Social History   Tobacco Use  . Smoking status: Never Smoker  . Smokeless tobacco: Never Used  Substance Use Topics  . Alcohol use: Not Currently    Family History  Problem Relation Age of Onset  . Breast cancer Sister   . Atrial fibrillation Mother   . Congestive Heart Failure Mother   . CAD Father      Review of Systems  Constitutional: Negative.   HENT: Positive for tinnitus.   Eyes: Negative.   Respiratory: Negative.   Cardiovascular:  Htn  Gastrointestinal: Negative.   Genitourinary:       Kidney stones  Musculoskeletal: Positive for joint pain.  Skin: Negative.   Neurological: Negative.   Endo/Heme/Allergies: Negative.   Psychiatric/Behavioral: Negative.     Objective:  Physical Exam  Constitutional: He is oriented to person, place, and time. He appears well-developed and well-nourished.  HENT:  Head: Normocephalic and atraumatic.  Eyes: Pupils are equal, round, and reactive to light.  Neck: Normal range of motion. Neck supple.  Cardiovascular: Normal rate and regular rhythm.  Respiratory: Effort normal.  Musculoskeletal:        General: Tenderness  present.     Comments: the patient continues to have discomfort with internal and external rotation of bilateral hips.  Neurological: He is alert and oriented to person, place, and time.  Skin: Skin is warm and dry.  Psychiatric: He has a normal mood and affect. His behavior is normal. Judgment and thought content normal.    Vital signs in last 24 hours:    Labs:   Estimated body mass index is 33.28 kg/m as calculated from the following:   Height as of 03/28/19: 5\' 5"  (1.651 m).   Weight as of 03/28/19: 90.7 kg.   Imaging Review Plain radiographs demonstrate AP of the pelvis and crosstable lateral of the left hip is taken and reviewed in office today.  Patient does have near bone-on-bone arthritis of the right hip superior weightbearing surface and severe arthritis left hip with periarticular osteophyte formation.      Assessment/Plan:  End stage arthritis, right hip(s)  The patient history, physical examination, clinical judgement of the provider and imaging studies are consistent with end stage degenerative joint disease of the right hip(s) and total hip arthroplasty is deemed medically necessary. The treatment options including medical management, injection therapy, arthroscopy and arthroplasty were discussed at length. The risks and benefits of total hip arthroplasty were presented and reviewed. The risks due to aseptic loosening, infection, stiffness, dislocation/subluxation,  thromboembolic complications and other imponderables were discussed.  The patient acknowledged the explanation, agreed to proceed with the plan and consent was signed. Patient is being admitted for inpatient treatment for surgery, pain control, PT, OT, prophylactic antibiotics, VTE prophylaxis, progressive ambulation and ADL's and discharge planning.The patient is planning to be discharged home with home health services   Anticipated LOS equal to or greater than 2 midnights due to - Age 95 and older with  one or more of the following:  - Obesity  - Expected need for hospital services (PT, OT, Nursing) required for safe  discharge  - Anticipated need for postoperative skilled nursing care or inpatient rehab  - Active co-morbidities: Coronary Artery Disease

## 2019-03-29 NOTE — Progress Notes (Signed)
SPOKE W/  Pt. Franklin Fisher via phone and reviewed the prescreening quesitons:   SCREENING SYMPTOMS OF COVID 19: COUGH--no  RUNNY NOSE--- seasonal/ chronic issue  SORE THROAT---no  NASAL CONGESTION----no  SNEEZING----no  SHORTNESS OF BREATH---no  DIFFICULTY BREATHING---no  TEMP >100.0 -----no  UNEXPLAINED BODY ACHES------no  CHILLS -------- no  HEADACHES ---------no  LOSS OF SMELL/ TASTE --------no    HAVE YOU OR ANY FAMILY MEMBER TRAVELLED PAST 14 DAYS OUT OF THE   COUNTY---no STATE----no COUNTRY----no  HAVE YOU OR ANY FAMILY MEMBER BEEN EXPOSED TO ANYONE WITH COVID 19?   no

## 2019-03-29 NOTE — Progress Notes (Signed)
  Echocardiogram 2D Echocardiogram has been performed.  Franklin Fisher 03/29/2019, 1:24 PM

## 2019-03-29 NOTE — Progress Notes (Signed)
Personally reviewed his echocardiogram and I generated report is he is on for surgery Monday total hip arthroplasty.  The echocardiogram showed mild concentric LVH and otherwise is normal there is no evidence of coronary disease or preceding myocardial infarction I think that he is optimized for his planned procedure I contacted patient and I appended a note that was faxed to his surgeon's office and I had called the OR schedule earlier in the day and asked him to be alert for [place].

## 2019-03-29 NOTE — Progress Notes (Signed)
SPOKE W/  LVMM in regards to notifying unit of yes to any of the following questions     SCREENING SYMPTOMS OF COVID 19:   COUGH--  RUNNY NOSE---   SORE THROAT---  NASAL CONGESTION----  SNEEZING----  SHORTNESS OF BREATH---  DIFFICULTY BREATHING---  TEMP >100.0 -----  UNEXPLAINED BODY ACHES------  CHILLS --------   HEADACHES ---------  LOSS OF SMELL/ TASTE --------    HAVE YOU OR ANY FAMILY MEMBER TRAVELLED PAST 14 DAYS OUT OF THE   COUNTY--- STATE---- COUNTRY----  HAVE YOU OR ANY FAMILY MEMBER BEEN EXPOSED TO ANYONE WITH COVID 19?

## 2019-03-31 MED ORDER — TRANEXAMIC ACID 1000 MG/10ML IV SOLN
2000.0000 mg | INTRAVENOUS | Status: DC
  Filled 2019-03-31: qty 20

## 2019-03-31 MED ORDER — BUPIVACAINE LIPOSOME 1.3 % IJ SUSP
20.0000 mL | INTRAMUSCULAR | Status: DC
  Filled 2019-03-31: qty 20

## 2019-03-31 NOTE — Anesthesia Preprocedure Evaluation (Addendum)
Anesthesia Evaluation  Patient identified by MRN, date of birth, ID band Patient awake    Reviewed: Allergy & Precautions, NPO status , Patient's Chart, lab work & pertinent test results, reviewed documented beta blocker date and time   Airway Mallampati: III  TM Distance: >3 FB     Dental  (+) Dental Advisory Given   Pulmonary neg pulmonary ROS,    breath sounds clear to auscultation       Cardiovascular hypertension, Pt. on medications and Pt. on home beta blockers  Rhythm:Regular Rate:Normal     Neuro/Psych  Headaches, Lumbar spinal stenosis TIACVA    GI/Hepatic negative GI ROS, Neg liver ROS,   Endo/Other  negative endocrine ROS  Renal/GU negative Renal ROS     Musculoskeletal  (+) Arthritis ,   Abdominal   Peds  Hematology negative hematology ROS (+)   Anesthesia Other Findings   Reproductive/Obstetrics                            Lab Results  Component Value Date   WBC 7.5 03/25/2019   HGB 15.9 03/25/2019   HCT 48.1 03/25/2019   MCV 92.3 03/25/2019   PLT 176 03/25/2019   Lab Results  Component Value Date   CREATININE 1.03 03/25/2019   BUN 20 03/25/2019   NA 139 03/25/2019   K 4.5 03/25/2019   CL 106 03/25/2019   CO2 27 03/25/2019    Anesthesia Physical Anesthesia Plan  ASA: III  Anesthesia Plan: Spinal   Post-op Pain Management:    Induction: Intravenous  PONV Risk Score and Plan: 1 and Propofol infusion, Ondansetron and Treatment may vary due to age or medical condition  Airway Management Planned: Simple Face Mask and Natural Airway  Additional Equipment:   Intra-op Plan:   Post-operative Plan:   Informed Consent: I have reviewed the patients History and Physical, chart, labs and discussed the procedure including the risks, benefits and alternatives for the proposed anesthesia with the patient or authorized representative who has indicated his/her  understanding and acceptance.     Dental advisory given  Plan Discussed with: CRNA  Anesthesia Plan Comments:        Anesthesia Quick Evaluation

## 2019-04-01 ENCOUNTER — Encounter (HOSPITAL_COMMUNITY)
Admission: RE | Disposition: A | Discharge status: Home / Self Care | Payer: Self-pay | Source: Other Acute Inpatient Hospital | Attending: Orthopedic Surgery

## 2019-04-01 ENCOUNTER — Encounter (HOSPITAL_COMMUNITY): Payer: Self-pay | Admitting: *Deleted

## 2019-04-01 ENCOUNTER — Inpatient Hospital Stay (HOSPITAL_COMMUNITY): Payer: Medicare Other

## 2019-04-01 ENCOUNTER — Inpatient Hospital Stay (HOSPITAL_COMMUNITY): Payer: Medicare Other | Admitting: Certified Registered"

## 2019-04-01 ENCOUNTER — Other Ambulatory Visit: Payer: Self-pay

## 2019-04-01 ENCOUNTER — Inpatient Hospital Stay (HOSPITAL_COMMUNITY): Payer: Medicare Other | Admitting: Physician Assistant

## 2019-04-01 ENCOUNTER — Observation Stay (HOSPITAL_COMMUNITY)
Admission: RE | Admit: 2019-04-01 | Discharge: 2019-04-02 | Disposition: A | Discharge status: Home / Self Care | Payer: Medicare Other | Source: Other Acute Inpatient Hospital | Attending: Orthopedic Surgery | Admitting: Orthopedic Surgery

## 2019-04-01 DIAGNOSIS — M1612 Unilateral primary osteoarthritis, left hip: Secondary | ICD-10-CM | POA: Diagnosis not present

## 2019-04-01 DIAGNOSIS — N400 Enlarged prostate without lower urinary tract symptoms: Secondary | ICD-10-CM | POA: Diagnosis not present

## 2019-04-01 DIAGNOSIS — M48061 Spinal stenosis, lumbar region without neurogenic claudication: Secondary | ICD-10-CM | POA: Diagnosis not present

## 2019-04-01 DIAGNOSIS — M1611 Unilateral primary osteoarthritis, right hip: Secondary | ICD-10-CM

## 2019-04-01 DIAGNOSIS — Z79891 Long term (current) use of opiate analgesic: Secondary | ICD-10-CM | POA: Diagnosis not present

## 2019-04-01 DIAGNOSIS — G459 Transient cerebral ischemic attack, unspecified: Secondary | ICD-10-CM | POA: Diagnosis not present

## 2019-04-01 DIAGNOSIS — Z8673 Personal history of transient ischemic attack (TIA), and cerebral infarction without residual deficits: Secondary | ICD-10-CM | POA: Diagnosis not present

## 2019-04-01 DIAGNOSIS — Z79899 Other long term (current) drug therapy: Secondary | ICD-10-CM | POA: Diagnosis not present

## 2019-04-01 DIAGNOSIS — Z96642 Presence of left artificial hip joint: Secondary | ICD-10-CM

## 2019-04-01 DIAGNOSIS — I10 Essential (primary) hypertension: Secondary | ICD-10-CM | POA: Diagnosis not present

## 2019-04-01 DIAGNOSIS — G4730 Sleep apnea, unspecified: Secondary | ICD-10-CM | POA: Diagnosis not present

## 2019-04-01 DIAGNOSIS — D62 Acute posthemorrhagic anemia: Secondary | ICD-10-CM | POA: Insufficient documentation

## 2019-04-01 DIAGNOSIS — M160 Bilateral primary osteoarthritis of hip: Secondary | ICD-10-CM | POA: Diagnosis not present

## 2019-04-01 DIAGNOSIS — Z419 Encounter for procedure for purposes other than remedying health state, unspecified: Secondary | ICD-10-CM

## 2019-04-01 HISTORY — PX: TOTAL HIP ARTHROPLASTY: SHX124

## 2019-04-01 HISTORY — DX: Sleep apnea, unspecified: G47.30

## 2019-04-01 LAB — TYPE AND SCREEN
ABO/RH(D): O NEG
Antibody Screen: NEGATIVE

## 2019-04-01 SURGERY — ARTHROPLASTY, HIP, TOTAL, ANTERIOR APPROACH
Anesthesia: Spinal | Site: Hip | Laterality: Left

## 2019-04-01 MED ORDER — BUPIVACAINE-EPINEPHRINE 0.25% -1:200000 IJ SOLN
INTRAMUSCULAR | Status: DC | PRN
  Administered 2019-04-01: 30 mL

## 2019-04-01 MED ORDER — METOCLOPRAMIDE HCL 5 MG/ML IJ SOLN: 5.0000 mg | Freq: Three times a day (TID) | INTRAMUSCULAR | Status: DC | PRN

## 2019-04-01 MED ORDER — HYDROMORPHONE HCL 1 MG/ML IJ SOLN
0.5000 mg | INTRAMUSCULAR | Status: DC | PRN
  Administered 2019-04-01: 1 mg via INTRAVENOUS
  Filled 2019-04-01: qty 1

## 2019-04-01 MED ORDER — FENTANYL CITRATE (PF) 100 MCG/2ML IJ SOLN
25.0000 ug | INTRAMUSCULAR | Status: DC | PRN
  Administered 2019-04-01 (×2): 25 ug via INTRAVENOUS

## 2019-04-01 MED ORDER — OXYCODONE-ACETAMINOPHEN 10-325 MG PO TABS: 1.0000 | ORAL_TABLET | Freq: Four times a day (QID) | ORAL | 0 refills | Status: DC | PRN

## 2019-04-01 MED ORDER — 0.9 % SODIUM CHLORIDE (POUR BTL) OPTIME
TOPICAL | Status: DC | PRN
  Administered 2019-04-01: 1000 mL

## 2019-04-01 MED ORDER — FLEET ENEMA 7-19 GM/118ML RE ENEM: 1.0000 | ENEMA | Freq: Once | RECTAL | Status: DC | PRN

## 2019-04-01 MED ORDER — FENTANYL CITRATE (PF) 100 MCG/2ML IJ SOLN
INTRAMUSCULAR | Status: AC
  Administered 2019-04-01: 25 ug via INTRAVENOUS
  Filled 2019-04-01: qty 2

## 2019-04-01 MED ORDER — PANTOPRAZOLE SODIUM 40 MG PO TBEC
40.0000 mg | DELAYED_RELEASE_TABLET | Freq: Every day | ORAL | Status: DC
  Administered 2019-04-01 – 2019-04-02 (×2): 40 mg via ORAL
  Filled 2019-04-01 (×2): qty 1

## 2019-04-01 MED ORDER — TRANEXAMIC ACID-NACL 1000-0.7 MG/100ML-% IV SOLN
1000.0000 mg | Freq: Once | INTRAVENOUS | Status: AC
  Administered 2019-04-01: 1000 mg via INTRAVENOUS
  Filled 2019-04-01: qty 100

## 2019-04-01 MED ORDER — SODIUM CHLORIDE (HYPERTONIC) 2 % OP SOLN
1.0000 [drp] | OPHTHALMIC | Status: DC | PRN
  Filled 2019-04-01: qty 15

## 2019-04-01 MED ORDER — KCL IN DEXTROSE-NACL 20-5-0.45 MEQ/L-%-% IV SOLN
INTRAVENOUS | Status: DC
  Administered 2019-04-01 – 2019-04-02 (×3): via INTRAVENOUS
  Filled 2019-04-01 (×4): qty 1000

## 2019-04-01 MED ORDER — ONDANSETRON HCL 4 MG/2ML IJ SOLN
INTRAMUSCULAR | Status: AC
  Filled 2019-04-01: qty 2

## 2019-04-01 MED ORDER — METOCLOPRAMIDE HCL 5 MG PO TABS: 5.0000 mg | ORAL_TABLET | Freq: Three times a day (TID) | ORAL | Status: DC | PRN

## 2019-04-01 MED ORDER — METHOCARBAMOL 500 MG PO TABS
500.0000 mg | ORAL_TABLET | Freq: Four times a day (QID) | ORAL | Status: DC | PRN
  Administered 2019-04-01: 500 mg via ORAL
  Filled 2019-04-01: qty 1

## 2019-04-01 MED ORDER — DEXAMETHASONE SODIUM PHOSPHATE 10 MG/ML IJ SOLN
INTRAMUSCULAR | Status: AC
  Filled 2019-04-01: qty 1

## 2019-04-01 MED ORDER — ALUM & MAG HYDROXIDE-SIMETH 200-200-20 MG/5ML PO SUSP: 15.0000 mL | ORAL | Status: DC | PRN

## 2019-04-01 MED ORDER — LACTATED RINGERS IV SOLN
INTRAVENOUS | Status: DC
  Administered 2019-04-01 (×2): via INTRAVENOUS

## 2019-04-01 MED ORDER — PHENOL 1.4 % MT LIQD: 1.0000 | OROMUCOSAL | Status: DC | PRN

## 2019-04-01 MED ORDER — DEXAMETHASONE SODIUM PHOSPHATE 10 MG/ML IJ SOLN
10.0000 mg | Freq: Once | INTRAMUSCULAR | Status: AC
  Administered 2019-04-02: 10 mg via INTRAVENOUS
  Filled 2019-04-01: qty 1

## 2019-04-01 MED ORDER — GABAPENTIN 300 MG PO CAPS
300.0000 mg | ORAL_CAPSULE | Freq: Three times a day (TID) | ORAL | Status: DC
  Administered 2019-04-01 – 2019-04-02 (×3): 300 mg via ORAL
  Filled 2019-04-01 (×3): qty 1

## 2019-04-01 MED ORDER — STERILE WATER FOR IRRIGATION IR SOLN
Status: DC | PRN
  Administered 2019-04-01 (×2): 1000 mL

## 2019-04-01 MED ORDER — ASPIRIN 81 MG PO CHEW
81.0000 mg | CHEWABLE_TABLET | Freq: Two times a day (BID) | ORAL | Status: DC
  Administered 2019-04-01 – 2019-04-02 (×2): 81 mg via ORAL
  Filled 2019-04-01 (×2): qty 1

## 2019-04-01 MED ORDER — BUPIVACAINE IN DEXTROSE 0.75-8.25 % IT SOLN
INTRATHECAL | Status: DC | PRN
  Administered 2019-04-01: 1.8 mL via INTRATHECAL

## 2019-04-01 MED ORDER — CELECOXIB 200 MG PO CAPS
200.0000 mg | ORAL_CAPSULE | Freq: Two times a day (BID) | ORAL | Status: DC
  Administered 2019-04-01 – 2019-04-02 (×2): 200 mg via ORAL
  Filled 2019-04-01 (×2): qty 1

## 2019-04-01 MED ORDER — ALBUTEROL SULFATE (2.5 MG/3ML) 0.083% IN NEBU: 2.5000 mg | INHALATION_SOLUTION | Freq: Four times a day (QID) | RESPIRATORY_TRACT | Status: DC | PRN

## 2019-04-01 MED ORDER — POLYETHYLENE GLYCOL 3350 17 G PO PACK
17.0000 g | PACK | Freq: Every day | ORAL | Status: DC | PRN
  Administered 2019-04-02: 17 g via ORAL
  Filled 2019-04-01: qty 1

## 2019-04-01 MED ORDER — DEXAMETHASONE SODIUM PHOSPHATE 10 MG/ML IJ SOLN
INTRAMUSCULAR | Status: DC | PRN
  Administered 2019-04-01: 8 mg via INTRAVENOUS

## 2019-04-01 MED ORDER — ONDANSETRON HCL 4 MG/2ML IJ SOLN
INTRAMUSCULAR | Status: DC | PRN
  Administered 2019-04-01: 4 mg via INTRAVENOUS

## 2019-04-01 MED ORDER — BISACODYL 5 MG PO TBEC: 5.0000 mg | DELAYED_RELEASE_TABLET | Freq: Every day | ORAL | Status: DC | PRN

## 2019-04-01 MED ORDER — TRANEXAMIC ACID-NACL 1000-0.7 MG/100ML-% IV SOLN
1000.0000 mg | INTRAVENOUS | Status: AC
  Administered 2019-04-01: 08:00:00 1000 mg via INTRAVENOUS
  Filled 2019-04-01: qty 100

## 2019-04-01 MED ORDER — CEFAZOLIN SODIUM-DEXTROSE 2-4 GM/100ML-% IV SOLN
2.0000 g | INTRAVENOUS | Status: AC
  Administered 2019-04-01: 07:00:00 2 g via INTRAVENOUS
  Filled 2019-04-01: qty 100

## 2019-04-01 MED ORDER — POVIDONE-IODINE 10 % EX SWAB: 2.0000 "application " | Freq: Once | CUTANEOUS | Status: DC

## 2019-04-01 MED ORDER — TIZANIDINE HCL 2 MG PO TABS: 2.0000 mg | ORAL_TABLET | Freq: Four times a day (QID) | ORAL | 0 refills | Status: DC | PRN

## 2019-04-01 MED ORDER — DOCUSATE SODIUM 100 MG PO CAPS
100.0000 mg | ORAL_CAPSULE | Freq: Two times a day (BID) | ORAL | Status: DC
  Administered 2019-04-01 – 2019-04-02 (×3): 100 mg via ORAL
  Filled 2019-04-01 (×3): qty 1

## 2019-04-01 MED ORDER — CHLORHEXIDINE GLUCONATE 4 % EX LIQD: 60.0000 mL | Freq: Once | CUTANEOUS | Status: DC

## 2019-04-01 MED ORDER — SODIUM CHLORIDE 0.9% FLUSH
INTRAVENOUS | Status: DC | PRN
  Administered 2019-04-01: 50 mL

## 2019-04-01 MED ORDER — PROPOFOL 10 MG/ML IV BOLUS
INTRAVENOUS | Status: DC | PRN
  Administered 2019-04-01: 20 mg via INTRAVENOUS
  Administered 2019-04-01: 10 mg via INTRAVENOUS
  Administered 2019-04-01: 20 mg via INTRAVENOUS

## 2019-04-01 MED ORDER — METHOCARBAMOL 500 MG IVPB - SIMPLE MED
INTRAVENOUS | Status: AC
  Filled 2019-04-01: qty 50

## 2019-04-01 MED ORDER — TAMSULOSIN HCL 0.4 MG PO CAPS
0.8000 mg | ORAL_CAPSULE | Freq: Every day | ORAL | Status: DC
  Administered 2019-04-02: 0.8 mg via ORAL
  Filled 2019-04-01: qty 2

## 2019-04-01 MED ORDER — ALUMINUM HYDROXIDE GEL 320 MG/5ML PO SUSP: 15.0000 mL | ORAL | Status: DC | PRN

## 2019-04-01 MED ORDER — EPHEDRINE SULFATE-NACL 50-0.9 MG/10ML-% IV SOSY
PREFILLED_SYRINGE | INTRAVENOUS | Status: DC | PRN
  Administered 2019-04-01 (×4): 5 mg via INTRAVENOUS

## 2019-04-01 MED ORDER — BUPIVACAINE LIPOSOME 1.3 % IJ SUSP
INTRAMUSCULAR | Status: DC | PRN
  Administered 2019-04-01: 20 mL

## 2019-04-01 MED ORDER — ERYTHROMYCIN 5 MG/GM OP OINT
TOPICAL_OINTMENT | Freq: Four times a day (QID) | OPHTHALMIC | Status: DC
  Administered 2019-04-01: 1 via OPHTHALMIC
  Administered 2019-04-02 (×2): via OPHTHALMIC
  Filled 2019-04-01: qty 1
  Filled 2019-04-01: qty 3.5

## 2019-04-01 MED ORDER — KETOCONAZOLE 2 % EX SHAM: 1.0000 "application " | MEDICATED_SHAMPOO | Freq: Every day | CUTANEOUS | Status: DC | PRN

## 2019-04-01 MED ORDER — PROMETHAZINE HCL 25 MG/ML IJ SOLN: 6.2500 mg | INTRAMUSCULAR | Status: DC | PRN

## 2019-04-01 MED ORDER — OXYCODONE HCL 5 MG PO TABS
5.0000 mg | ORAL_TABLET | ORAL | Status: DC | PRN
  Administered 2019-04-01 – 2019-04-02 (×4): 10 mg via ORAL
  Filled 2019-04-01 (×4): qty 2

## 2019-04-01 MED ORDER — DIPHENHYDRAMINE HCL 12.5 MG/5ML PO ELIX: 12.5000 mg | ORAL_SOLUTION | ORAL | Status: DC | PRN

## 2019-04-01 MED ORDER — TRANEXAMIC ACID 1000 MG/10ML IV SOLN
INTRAVENOUS | Status: DC | PRN
  Administered 2019-04-01: 2000 mg via TOPICAL

## 2019-04-01 MED ORDER — ONDANSETRON HCL 4 MG PO TABS: 4.0000 mg | ORAL_TABLET | Freq: Four times a day (QID) | ORAL | Status: DC | PRN

## 2019-04-01 MED ORDER — ONDANSETRON HCL 4 MG/2ML IJ SOLN: 4.0000 mg | Freq: Four times a day (QID) | INTRAMUSCULAR | Status: DC | PRN

## 2019-04-01 MED ORDER — CLOTRIMAZOLE-BETAMETHASONE 1-0.05 % EX CREA: 1.0000 "application " | TOPICAL_CREAM | Freq: Every day | CUTANEOUS | Status: DC | PRN

## 2019-04-01 MED ORDER — BUPIVACAINE-EPINEPHRINE (PF) 0.25% -1:200000 IJ SOLN
INTRAMUSCULAR | Status: AC
  Filled 2019-04-01: qty 30

## 2019-04-01 MED ORDER — ASPIRIN EC 81 MG PO TBEC: 81.0000 mg | DELAYED_RELEASE_TABLET | Freq: Two times a day (BID) | ORAL | 0 refills | Status: DC

## 2019-04-01 MED ORDER — ACETAMINOPHEN 325 MG PO TABS: 325.0000 mg | ORAL_TABLET | Freq: Four times a day (QID) | ORAL | Status: DC | PRN

## 2019-04-01 MED ORDER — ATENOLOL 50 MG PO TABS
100.0000 mg | ORAL_TABLET | Freq: Every day | ORAL | Status: DC
  Administered 2019-04-02: 100 mg via ORAL
  Filled 2019-04-01: qty 2

## 2019-04-01 MED ORDER — MENTHOL 3 MG MT LOZG: 1.0000 | LOZENGE | OROMUCOSAL | Status: DC | PRN

## 2019-04-01 MED ORDER — METHOCARBAMOL 500 MG IVPB - SIMPLE MED
500.0000 mg | Freq: Four times a day (QID) | INTRAVENOUS | Status: DC | PRN
  Administered 2019-04-01: 500 mg via INTRAVENOUS
  Filled 2019-04-01: qty 50

## 2019-04-01 MED ORDER — ACETAMINOPHEN 500 MG PO TABS
1000.0000 mg | ORAL_TABLET | Freq: Four times a day (QID) | ORAL | Status: AC
  Administered 2019-04-01 – 2019-04-02 (×4): 1000 mg via ORAL
  Filled 2019-04-01 (×4): qty 2

## 2019-04-01 MED ORDER — PROPOFOL 10 MG/ML IV BOLUS
INTRAVENOUS | Status: AC
  Filled 2019-04-01: qty 80

## 2019-04-01 MED ORDER — PROPOFOL 10 MG/ML IV BOLUS
INTRAVENOUS | Status: AC
  Filled 2019-04-01: qty 20

## 2019-04-01 MED ORDER — SODIUM CHLORIDE (PF) 0.9 % IJ SOLN
INTRAMUSCULAR | Status: AC
  Filled 2019-04-01: qty 50

## 2019-04-01 MED ORDER — PROPOFOL 500 MG/50ML IV EMUL
INTRAVENOUS | Status: DC | PRN
  Administered 2019-04-01: 125 ug/kg/min via INTRAVENOUS

## 2019-04-01 SURGICAL SUPPLY — 45 items
BAG DECANTER FOR FLEXI CONT (MISCELLANEOUS) ×2 IMPLANT
BLADE SAW SGTL 18X1.27X75 (BLADE) ×2 IMPLANT
BLADE SURG SZ10 CARB STEEL (BLADE) ×4 IMPLANT
CHLORAPREP W/TINT 26 (MISCELLANEOUS) ×2 IMPLANT
CONT SPEC 4OZ CLIKSEAL STRL BL (MISCELLANEOUS) ×2 IMPLANT
COVER PERINEAL POST (MISCELLANEOUS) ×2 IMPLANT
COVER SURGICAL LIGHT HANDLE (MISCELLANEOUS) ×2 IMPLANT
COVER WAND RF STERILE (DRAPES) ×2 IMPLANT
CUP ACETBLR 52 OD 100 SERIES (Hips) ×2 IMPLANT
DECANTER SPIKE VIAL GLASS SM (MISCELLANEOUS) ×4 IMPLANT
DRAPE STERI IOBAN 125X83 (DRAPES) ×2 IMPLANT
DRAPE U-SHAPE 47X51 STRL (DRAPES) ×4 IMPLANT
DRSG AQUACEL AG ADV 3.5X10 (GAUZE/BANDAGES/DRESSINGS) ×2 IMPLANT
ELECT BLADE TIP CTD 4 INCH (ELECTRODE) ×2 IMPLANT
ELECT REM PT RETURN 15FT ADLT (MISCELLANEOUS) ×2 IMPLANT
ELIMINATOR HOLE APEX DEPUY (Hips) ×2 IMPLANT
GLOVE BIO SURGEON STRL SZ7.5 (GLOVE) ×2 IMPLANT
GLOVE BIO SURGEON STRL SZ8.5 (GLOVE) ×2 IMPLANT
GLOVE BIOGEL PI IND STRL 8 (GLOVE) ×1 IMPLANT
GLOVE BIOGEL PI IND STRL 9 (GLOVE) ×1 IMPLANT
GLOVE BIOGEL PI INDICATOR 8 (GLOVE) ×1
GLOVE BIOGEL PI INDICATOR 9 (GLOVE) ×1
GOWN STRL REUS W/TWL XL LVL3 (GOWN DISPOSABLE) ×4 IMPLANT
HEAD M SROM 36MM PLUS 1.5 (Hips) ×1 IMPLANT
HOLDER FOLEY CATH W/STRAP (MISCELLANEOUS) ×2 IMPLANT
KIT TURNOVER KIT A (KITS) IMPLANT
LINER NEUTRAL 52X36MM PLUS 4 (Liner) ×2 IMPLANT
MANIFOLD NEPTUNE II (INSTRUMENTS) ×2 IMPLANT
NEEDLE HYPO 21X1.5 SAFETY (NEEDLE) ×4 IMPLANT
NS IRRIG 1000ML POUR BTL (IV SOLUTION) ×2 IMPLANT
PACK ANTERIOR HIP CUSTOM (KITS) ×2 IMPLANT
SROM M HEAD 36MM PLUS 1.5 (Hips) ×2 IMPLANT
STEM FEM ACTIS HIGH SZ7 (Stem) ×2 IMPLANT
SUT ETHIBOND NAB CT1 #1 30IN (SUTURE) ×2 IMPLANT
SUT VIC AB 0 CT1 27 (SUTURE) ×1
SUT VIC AB 0 CT1 27XBRD ANBCTR (SUTURE) ×1 IMPLANT
SUT VIC AB 1 CTX 36 (SUTURE) ×1
SUT VIC AB 1 CTX36XBRD ANBCTR (SUTURE) ×1 IMPLANT
SUT VIC AB 2-0 CT1 27 (SUTURE) ×1
SUT VIC AB 2-0 CT1 TAPERPNT 27 (SUTURE) ×1 IMPLANT
SUT VIC AB 3-0 CT1 27 (SUTURE) ×1
SUT VIC AB 3-0 CT1 TAPERPNT 27 (SUTURE) ×1 IMPLANT
SYR CONTROL 10ML LL (SYRINGE) ×6 IMPLANT
TRAY FOLEY MTR SLVR 16FR STAT (SET/KITS/TRAYS/PACK) IMPLANT
YANKAUER SUCT BULB TIP 10FT TU (MISCELLANEOUS) ×2 IMPLANT

## 2019-04-01 NOTE — Care Plan (Signed)
Ortho Bundle Case Management Note  Patient Details  Name: Franklin Fisher MRN: [id number] Date of Birth: 1941/07/13   Spoke with patient prior to surgery. He plans to discharge to home with friend to help post op. Rolling walker and 3n1 have been ordered for him.  He will discharge to the following address.     [address] [place], [place] [address].   Patient and MD are aware of plan and agreeable. Choice offered.                    DME Arranged:  3-N-1, Walker rolling DME Agency:  [employer]  HH Arranged:  PT [place] Agency:  [facility]  Additional Comments: Please contact me with any questions of if this plan should need to change.  Ladell Heads,  [facility] Specialist  [phone] 04/01/2019, 8:33 AM

## 2019-04-01 NOTE — Evaluation (Signed)
Physical Therapy Evaluation Patient Details Name: Franklin Fisher MRN: [id number] DOB: [birth year] Today's Date: 04/01/2019   History of Present Illness  78 yo male s/p R THA-direct anterior 6/15. Hx of chronic back pain  Clinical Impression  On eval POD 0, pt required Min-Mod assist for mobility. He walked ~40 feet with a RW. Moderate pain with activity. Will follow and progress activity as tolerated. Plan is for d/c home once ready.     Follow Up Recommendations Follow surgeon's recommendation for DC plan and follow-up therapies(HHPT)    Equipment Recommendations  Rolling walker with 5" wheels    Recommendations for Other Services       Precautions / Restrictions Precautions Precautions: Fall Restrictions Weight Bearing Restrictions: No Other Position/Activity Restrictions: WBAT      Mobility  Bed Mobility Overal bed mobility: Needs Assistance Bed Mobility: Supine to Sit     Supine to sit: Mod assist;HOB elevated     General bed mobility comments: Assist for trunk and bil LEs. Increased time and effort. Cues for technique.  Transfers Overall transfer level: Needs assistance Equipment used: Rolling walker (2 wheeled) Transfers: Sit to/from Stand Sit to Stand: Min assist;From elevated surface         General transfer comment: VCs safety, technique, hand placement. Assist to rise, stabilize, control descent  Ambulation/Gait Ambulation/Gait assistance: Min assist Gait Distance (Feet): 40 Feet Assistive device: Rolling walker (2 wheeled) Gait Pattern/deviations: Step-to pattern     General Gait Details: VCs safety, technique, sequence. Assist to stabilize pt. Slow gait speed. Pt fatigues fairly easily.  Stairs            Wheelchair Mobility    Modified Rankin (Stroke Patients Only)       Balance Overall balance assessment: Needs assistance         Standing balance support: Bilateral upper extremity supported Standing balance-Leahy Scale:  Poor                               Pertinent Vitals/Pain Pain Assessment: 0-10 Pain Score: 7  Pain Location: L hip, back Pain Descriptors / Indicators: Aching;Sore Pain Intervention(s): Monitored during session    Home Living Family/patient expects to be discharged to:: Private residence Living Arrangements: Spouse/significant other Available Help at Discharge: Family Type of Home: House Home Access: Stairs to enter Entrance Stairs-Rails: None Technical brewer of Steps: 1 Home Layout: One level Home Equipment: Bedside commode      Prior Function Level of Independence: Independent               Hand Dominance        Extremity/Trunk Assessment   Upper Extremity Assessment Upper Extremity Assessment: Overall WFL for tasks assessed    Lower Extremity Assessment Lower Extremity Assessment: Generalized weakness(post op weakness s/p THA)       Communication   Communication: No difficulties  Cognition Arousal/Alertness: Awake/alert Behavior During Therapy: WFL for tasks assessed/performed Overall Cognitive Status: Within Functional Limits for tasks assessed                                        General Comments      Exercises     Assessment/Plan    PT Assessment Patient needs continued PT services  PT Problem List Decreased strength;Decreased mobility;Decreased range of motion;Decreased activity tolerance;Decreased balance;Decreased knowledge of use of DME;Pain;Obesity  PT Treatment Interventions DME instruction;Gait training;Therapeutic activities;Therapeutic exercise;Patient/family education;Balance training;Functional mobility training    PT Goals (Current goals can be found in the Care Plan section)  Acute Rehab PT Goals Patient Stated Goal: regain PLOF. Less pain. PT Goal Formulation: With patient Time For Goal Achievement: 04/15/19 Potential to Achieve Goals: Good    Frequency 7X/week   Barriers to  discharge        Co-evaluation               AM-PAC PT "6 Clicks" Mobility  Outcome Measure Help needed turning from your back to your side while in a flat bed without using bedrails?: A Lot Help needed moving from lying on your back to sitting on the side of a flat bed without using bedrails?: A Lot Help needed moving to and from a bed to a chair (including a wheelchair)?: A Little Help needed standing up from a chair using your arms (e.g., wheelchair or bedside chair)?: A Little Help needed to walk in hospital room?: A Little Help needed climbing 3-5 steps with a railing? : A Little 6 Click Score: 16    End of Session Equipment Utilized During Treatment: Gait belt Activity Tolerance: Patient tolerated treatment well Patient left: in chair;with call bell/phone within reach   PT Visit Diagnosis: Muscle weakness (generalized) (M62.81);Pain;Other abnormalities of gait and mobility (R26.89) Pain - Right/Left: Left Pain - part of body: Hip    Time: 1761-6073 PT Time Calculation (min) (ACUTE ONLY): 25 min   Charges:   PT Evaluation $PT Eval Low Complexity: 1 Low PT Treatments $Gait Training: 8-22 mins          Weston Anna, PT Acute Rehabilitation Services Pager: [phone] Office: [phone]

## 2019-04-01 NOTE — Interval H&P Note (Signed)
History and Physical Interval Note:  04/01/2019 7:01 AM  Franklin Fisher  has presented today for surgery, with the diagnosis of Left Anterior Hip Osteoarthritis.  The various methods of treatment have been discussed with the patient and family. After consideration of risks, benefits and other options for treatment, the patient has consented to  Procedure(s): TOTAL HIP ARTHROPLASTY ANTERIOR APPROACH (Left) as a surgical intervention.  The patient's history has been reviewed, patient examined, no change in status, stable for surgery.  I have reviewed the patient's chart and labs.  Questions were answered to the patient's satisfaction.     Kerin Salen

## 2019-04-01 NOTE — Plan of Care (Signed)
  Problem: Education: Goal: Knowledge of General Education information will improve Description: Including pain rating scale, medication(s)/side effects and non-pharmacologic comfort measures Outcome: Progressing   Problem: Clinical Measurements: Goal: Will remain free from infection Outcome: Progressing   Problem: Clinical Measurements: Goal: Respiratory complications will improve Outcome: Progressing   Problem: Clinical Measurements: Goal: Diagnostic test results will improve Outcome: Progressing   Problem: Clinical Measurements: Goal: Cardiovascular complication will be avoided Outcome: Progressing

## 2019-04-01 NOTE — Op Note (Signed)
OPERATIVE REPORT    DATE OF PROCEDURE:  04/01/2019       PREOPERATIVE DIAGNOSIS:  Left Anterior Hip Osteoarthritis                                                          POSTOPERATIVE DIAGNOSIS:  Left Anterior Hip Osteoarthritis                                                           PROCEDURE: Anterior L total hip arthroplasty using a 52 mm DePuy Pinnacle  Cup, [employer], 0-degree polyethylene liner, a +1.5 mm x 83mm metal head, a 7 hi Depuy Actis stem   SURGEON: Kerin Salen    ASSISTANT:   Kerry Hough. [employer]  (present throughout entire procedure and necessary for timely completion of the procedure)   ANESTHESIA: Spinal BLOOD LOSS: 300 cc FLUID REPLACEMENT: 1600 cc crystalloid Antibiotic: 2gm ancef Tranexamic Acid: 1gm IV, 2gm Topical Exparel: 266mg  COMPLICATIONS: none    INDICATIONS FOR PROCEDURE: A 78 y.o. year-old With  Left Anterior Hip Osteoarthritis   for 5 years, x-rays show bone-on-bone arthritic changes, and osteophytes. Despite conservative measures with observation, anti-inflammatory medicine, narcotics, use of a cane, has severe unremitting pain and can ambulate only a few blocks before resting. Patient desires elective L total hip arthroplasty to decrease pain and increase function. The risks, benefits, and alternatives were discussed at length including but not limited to the risks of infection, bleeding, nerve injury, stiffness, blood clots, the need for revision surgery, cardiopulmonary complications, among others, and they were willing to proceed. Questions answered      PROCEDURE IN DETAIL: The patient was identified by armband,   received preoperative IV antibiotics in the holding area at [facility], taken to the operating room , appropriate anesthetic monitors   were attached and  anesthesia was induced with the patient on the gurney. The HANA boots were applied to the feet and the patient  was transferred to the HANA table with a  peroneal post and support underneath the non-operative leg, which was locked in 2 lb traction. Theoperative lower extremity was then prepped and draped in the usual sterile fashion from just above the iliac crest to the knee. And a timeout procedure was performed. We then made a 12 cm incision along the interval at the leading edge of the tensor fascia lata of starting at 2 cm lateral to the ASIS. Small bleeders in the skin and subcutaneous tissue identified and cauterized we dissected down to the fascia and made an incision in the fascia allowing [place] to elevate the fascia of the tensor muscle and exploited the interval between the rectus and the tensor fascia lata. A Cobra retractor was then placed along the superior neck of the femur. A cerebellar retractor was used to expose the interval between the tensor fascia lata and the rectus femoris. .  We identified and cauterized the ascending branch of the anterior circumflex artery. A second Cobra retractor along the inferior neck of the femur. A small Hohmann retractor was placed underneath the origin of the rectus  femoris, giving [place] good medial exposure. Using Ronguers fatty tissue was removed from in front of the anterior capsule. The capsule was then incised, starting out at the superior anterior aspect of the acetabulum going laterally along the anterior neck. The capsule was then teed along the neck superiorly and inferiorly. Electrocautery was used to release capsule from the anterior and medial neck of the femur to allow external rotation. Cobra retractors were then placed along the inferior and superior neck allowing [place] to perform a standard neck cut and removed the femoral head with a power corkscrew. We then placed a medium bent homan retractor in the cotyloid notch and posteriorly along the acetabular rim a narrow Cobra retractor. Exposed labral tissue was then removed with the electrocautery. We then sequentially reamed up to a 51 mm basket reamer  obtaining good coverage in all quadrants, verified by C-arm imaging. Under C-arm control we then hammered into place a 52 mm Pinnacle cup in 45 of abduction and 15 of anteversion. The cup seated nicely and required no supplemental screws. We then placed a central hole Eliminator and a 0 polyethylene liner. The foot was then externally rotated to 130-140. The limb was extended and adducted delivering the proximal femur up into the wound. A medium curved Hohmann retractor was placed over the greater trochanter and a long Homan retractor along the posterior femoral neck completing the exposure. We then performed releases superiorly and and inferiorly of the capsule going back to the pirformis fossa superiorly and to the lesser trochanter inferiorly. We then entered the proximal femur with the box cutting offset chisel followed by, a canal sounder, the chili pepper and broaching up to a 7 broach. This seated nicely and we reamed the calcar. A trial reduction was performed with a 1.5 mm 36 mm head.The limb lengths were excellent the hip was stable in 90 of external rotation. At this point the trial components removed and we hammered into place a # 7 hi  Offset Actis stem with Gryption coating. A +1.5 mm x 36 metal head was then hammered into place. The hip was reduced and final C-arm images obtained. The wound was thoroughly irrigated with normal saline solution. We repaired the ant capsule and the tensor fascia lot a with running 0 vicryl suture. the subcutaneous tissue was closed with 2-0 and 3-0 Vicryl suture followed by an Aquacil dressing. At this point the patient was awaken and transferred to hospital gurney without difficulty.   Kerin Salen 04/01/2019, 8:48 AM

## 2019-04-01 NOTE — Anesthesia Procedure Notes (Signed)
Procedure Name: MAC Date/Time: 04/01/2019 7:17 AM Performed by: Niel Hummer, CRNA Pre-anesthesia Checklist: Patient identified, Emergency Drugs available, Suction available and Patient being monitored Patient Re-evaluated:Patient Re-evaluated prior to induction Oxygen Delivery Method: Simple face mask

## 2019-04-01 NOTE — Transfer of Care (Signed)
Immediate Anesthesia Transfer of Care Note  Patient: Franklin Fisher  Procedure(s) Performed: LEFT TOTAL HIP ARTHROPLASTY ANTERIOR APPROACH (Left Hip)  Patient Location: PACU  Anesthesia Type:Spinal  Level of Consciousness: awake  Airway & Oxygen Therapy: Patient Spontanous Breathing and Patient connected to face mask oxygen  Post-op Assessment: Report given to RN and Post -op Vital signs reviewed and stable  Post vital signs: Reviewed and stable  Last Vitals:  Vitals Value Taken Time  BP 100/57 04/01/19 0921  Temp    Pulse 63 04/01/19 0923  Resp 18 04/01/19 0923  SpO2 99 % 04/01/19 0923  Vitals shown include unvalidated device data.  Last Pain:  Vitals:   04/01/19 0621  TempSrc:   PainSc: 0-No pain         Complications: No apparent anesthesia complications

## 2019-04-01 NOTE — Anesthesia Postprocedure Evaluation (Signed)
Anesthesia Post Note  Patient: Franklin Fisher  Procedure(s) Performed: LEFT TOTAL HIP ARTHROPLASTY ANTERIOR APPROACH (Left Hip)     Patient location during evaluation: PACU Anesthesia Type: Spinal Level of consciousness: awake and alert Pain management: pain level controlled Vital Signs Assessment: post-procedure vital signs reviewed and stable Respiratory status: spontaneous breathing and respiratory function stable Cardiovascular status: blood pressure returned to baseline and stable Postop Assessment: spinal receding Anesthetic complications: no    Last Vitals:  Vitals:   04/01/19 1105 04/01/19 1202  BP: 122/65 113/70  Pulse: (!) 55 (!) 52  Resp: 14 17  Temp: (!) 36.4 C 36.5 C  SpO2: 99% 100%    Last Pain:  Vitals:   04/01/19 1202  TempSrc: Oral  PainSc:                  Tiajuana Amass

## 2019-04-01 NOTE — Discharge Instructions (Signed)

## 2019-04-01 NOTE — Anesthesia Procedure Notes (Signed)
Spinal  Patient location during procedure: OR Start time: 04/01/2019 7:19 AM End time: 04/01/2019 7:23 AM Staffing Resident/CRNA: Niel Hummer, CRNA Performed: resident/CRNA  Preanesthetic Checklist Completed: patient identified, surgical consent, pre-op evaluation, IV checked, risks and benefits discussed and monitors and equipment checked Spinal Block Patient position: sitting Prep: DuraPrep Patient monitoring: blood pressure, continuous pulse ox and heart rate Approach: midline Location: L2-3 Injection technique: single-shot Needle Needle type: Pencan  Needle gauge: 24 G

## 2019-04-02 ENCOUNTER — Encounter (HOSPITAL_COMMUNITY): Payer: Self-pay | Admitting: Orthopedic Surgery

## 2019-04-02 DIAGNOSIS — D62 Acute posthemorrhagic anemia: Secondary | ICD-10-CM | POA: Diagnosis not present

## 2019-04-02 DIAGNOSIS — N400 Enlarged prostate without lower urinary tract symptoms: Secondary | ICD-10-CM | POA: Diagnosis not present

## 2019-04-02 DIAGNOSIS — M1612 Unilateral primary osteoarthritis, left hip: Secondary | ICD-10-CM | POA: Diagnosis not present

## 2019-04-02 DIAGNOSIS — I10 Essential (primary) hypertension: Secondary | ICD-10-CM | POA: Diagnosis not present

## 2019-04-02 DIAGNOSIS — Z8673 Personal history of transient ischemic attack (TIA), and cerebral infarction without residual deficits: Secondary | ICD-10-CM | POA: Diagnosis not present

## 2019-04-02 DIAGNOSIS — G4730 Sleep apnea, unspecified: Secondary | ICD-10-CM | POA: Diagnosis not present

## 2019-04-02 LAB — BASIC METABOLIC PANEL
Anion gap: 7 (ref 5–15)
BUN: 15 mg/dL (ref 8–23)
CO2: 25 mmol/L (ref 22–32)
Calcium: 8.6 mg/dL — ABNORMAL LOW (ref 8.9–10.3)
Chloride: 105 mmol/L (ref 98–111)
Creatinine, Ser: 1.02 mg/dL (ref 0.61–1.24)
GFR calc Af Amer: >60 mL/min (ref >60)
GFR calc non Af Amer: >60 mL/min (ref >60)
Glucose, Bld: 152 mg/dL — ABNORMAL HIGH (ref 70–99)
Potassium: 4 mmol/L (ref 3.5–5.1)
Sodium: 137 mmol/L (ref 135–145)

## 2019-04-02 LAB — CBC
HCT: 38.7 % — ABNORMAL LOW (ref 39.0–52.0)
Hemoglobin: 12.9 g/dL — ABNORMAL LOW (ref 13.0–17.0)
MCH: 31.2 pg (ref 26.0–34.0)
MCHC: 33.3 g/dL (ref 30.0–36.0)
MCV: 93.7 fL (ref 80.0–100.0)
Platelets: 127 10*3/uL — ABNORMAL LOW (ref 150–400)
RBC: 4.13 MIL/uL — ABNORMAL LOW (ref 4.22–5.81)
RDW: 13 % (ref 11.5–15.5)
WBC: 10.5 10*3/uL (ref 4.0–10.5)
nRBC: 0 % (ref 0.0–0.2)

## 2019-04-02 NOTE — Discharge Summary (Signed)
Patient ID: Franklin Fisher MRN: [id number] DOB/AGE: [birth year] 78 y.o.  Admit date: 04/01/2019 Discharge date: 04/02/2019  Admission Diagnoses:  Principal Problem:   Osteoarthritis of right hip Active Problems:   History of total hip arthroplasty, left   Discharge Diagnoses:  Same  Past Medical History:  Diagnosis Date  . BPH (benign prostatic hyperplasia)   . Headache    stopped at age 18  . History of kidney stones    passes 1-2 per year  . Hypertension 2000   Dr. Orland Mustard  . Sleep apnea   . Stroke [facility])    TIA cat scans and MRI were WNL  . TIA (transient ischemic attack)     Surgeries: Procedure(s): LEFT TOTAL HIP ARTHROPLASTY ANTERIOR APPROACH on 04/01/2019   Consultants:   Discharged Condition: Improved  Hospital Course: Franklin Fisher is an 78 y.o. male who was admitted 04/01/2019 for operative treatment ofOsteoarthritis of right hip. Patient has severe unremitting pain that affects sleep, daily activities, and work/hobbies. After pre-op clearance the patient was taken to the operating room on 04/01/2019 and underwent  Procedure(s): LEFT TOTAL HIP ARTHROPLASTY ANTERIOR APPROACH.    Patient was given perioperative antibiotics:  Anti-infectives (From admission, onward)   Start     Dose/Rate Route Frequency Ordered Stop   04/01/19 0630  ceFAZolin (ANCEF) IVPB 2g/100 mL premix     2 g 200 mL/hr over 30 Minutes Intravenous On call to O.R. 04/01/19 9417 04/01/19 0753       Patient was given sequential compression devices, early ambulation, and chemoprophylaxis to prevent DVT.  Patient benefited maximally from hospital stay and there were no complications.    Recent vital signs:  Patient Vitals for the past 24 hrs:  BP Temp Temp src Pulse Resp SpO2 Height Weight  04/02/19 0946 (!) 125/58 98.1 F (36.7 C) - 62 17 99 % - -  04/02/19 0538 (!) 111/54 97.9 F (36.6 C) Oral 62 16 100 % - -  04/02/19 0212 (!) 110/53 98 F (36.7 C) Oral 62 18 98 % - -  04/01/19 2135  112/66 97.7 F (36.5 C) Oral 66 18 100 % - -  04/01/19 2041 - - - - 18 - - -  04/01/19 2000 - - - - - - 5\' 5"  (1.651 m) 90.7 kg  04/01/19 1726 109/72 (!) 97.5 F (36.4 C) - 79 16 99 % - -  04/01/19 1352 117/70 97.8 F (36.6 C) Oral (!) 58 16 100 % - -  04/01/19 1255 108/68 98 F (36.7 C) - (!) 58 17 100 % - -  04/01/19 1202 113/70 97.7 F (36.5 C) Oral (!) 52 17 100 % - -     Recent laboratory studies:  Recent Labs    04/02/19 0334  WBC 10.5  HGB 12.9*  HCT 38.7*  PLT 127*  NA 137  K 4.0  CL 105  CO2 25  BUN 15  CREATININE 1.02  GLUCOSE 152*  CALCIUM 8.6*     Discharge Medications:   Allergies as of 04/02/2019      Reactions   Ivp Dye [iodinated Diagnostic Agents] Hives      Medication List    TAKE these medications   albuterol 108 (90 Base) MCG/ACT inhaler Commonly known as: VENTOLIN HFA Inhale 2 puffs into the lungs every 6 (six) hours as needed for wheezing or shortness of breath.   aspirin EC 81 MG tablet Take 1 tablet (81 mg total) by mouth 2 (two) times daily.  atenolol 100 MG tablet Commonly known as: TENORMIN Take 100 mg by mouth daily.   clotrimazole-betamethasone cream Commonly known as: LOTRISONE Apply 1 application topically daily as needed for itching.   ketoconazole 2 % shampoo Commonly known as: NIZORAL Apply 1 application topically daily as needed for irritation.   oxyCODONE-acetaminophen 10-325 MG tablet Commonly known as: PERCOCET Take 1 tablet by mouth every 6 (six) hours as needed for pain. What changed:   when to take this  reasons to take this   tamsulosin 0.4 MG Caps capsule Commonly known as: FLOMAX Take 0.8 mg by mouth daily.   tiZANidine 2 MG tablet Commonly known as: ZANAFLEX Take 1 tablet (2 mg total) by mouth every 6 (six) hours as needed.            Durable Medical Equipment  (From admission, onward)         Start     Ordered   04/01/19 1111  DME Walker rolling  Once    Question:  Patient needs a  walker to treat with the following condition  Answer:  Status post total hip replacement, left   04/01/19 1110   04/01/19 1111  DME 3 n 1  Once     04/01/19 1110           Discharge Care Instructions  (From admission, onward)         Start     Ordered   04/02/19 0000  Weight bearing as tolerated     04/02/19 1121          Diagnostic Studies: Dg Chest 2 View  Result Date: 03/25/2019 CLINICAL DATA:  Preoperative study prior to hip replacement. EXAM: CHEST - 2 VIEW COMPARISON:  None. FINDINGS: Atelectasis in the left base. The heart, hila, mediastinum, lungs, and pleura are otherwise unremarkable. IMPRESSION: Mild atelectasis in the left base. No other significant abnormalities. Electronically Signed   By: Dorise Bullion III M.D   On: 03/25/2019 16:13   Dg C-arm 1-60 Min-no Report  Result Date: 04/01/2019 Fluoroscopy was utilized by the requesting physician.  No radiographic interpretation.   Dg Hip Operative Unilat W Or W/o Pelvis Left  Result Date: 04/01/2019 CLINICAL DATA:  Osteoarthritis. EXAM: OPERATIVE LEFT HIP (WITH PELVIS IF PERFORMED) to VIEWS TECHNIQUE: Fluoroscopic spot image(s) were submitted for interpretation post-operatively. COMPARISON:  06/09/2018 FINDINGS: Patient is status post LEFT anterior THR. IMPRESSION: Satisfactory position and alignment. Electronically Signed   By: Staci Righter M.D.   On: 04/01/2019 09:00    Disposition: Discharge disposition: 01-Home or Self Care       Discharge Instructions    Call MD / Call 911   Complete by: As directed    If you experience chest pain or shortness of breath, CALL 911 and be transported to the hospital emergency room.  If you develope a fever above 101 F, pus (white drainage) or increased drainage or redness at the wound, or calf pain, call your surgeon's office.   Constipation Prevention   Complete by: As directed    Drink plenty of fluids.  Prune juice may be helpful.  You may use a stool softener, such as  Colace (over the counter) 100 mg twice a day.  Use MiraLax (over the counter) for constipation as needed.   Diet - low sodium heart healthy   Complete by: As directed    Driving restrictions   Complete by: As directed    No driving for 2 weeks   Increase activity slowly  as tolerated   Complete by: As directed    Patient may shower   Complete by: As directed    You may shower without a dressing once there is no drainage.  Do not wash over the wound.  If drainage remains, cover wound with plastic wrap and then shower.   Weight bearing as tolerated   Complete by: As directed       Follow-up Information    Frederik Pear, MD. Go on 04/16/2019.   Specialty: Orthopedic Surgery Why: Your appointment is scheduled for 9:30  Contact information: [place] [address] [phone]        [facility], [place]. Go on 04/16/2019.   Why: You are scheduled to start outpatient physical therapy at 11:20. Please go directly across the lobby after your MD appointment to complete your paperwork.  Contact information: [place] [address] [phone]        [facility] Follow up.   Specialty: Home Health Services Why: You will have 5 HHPT visits prior to seeing Dr. Mayer Camel in follow up.  Contact information: [address] [place] [address] [phone]            Signed: Joanell Rising 04/02/2019, 11:21 AM

## 2019-04-02 NOTE — Progress Notes (Signed)
Physical Therapy Treatment Patient Details Name: Franklin Fisher MRN: [id number] DOB: 1940-11-17 Today's Date: 04/02/2019    History of Present Illness 78 yo male s/p R THA-direct anterior 6/15. Hx of chronic back pain    PT Comments    POD # 1 am session Assisted OOB.  Demonstrated and instructed pt how to use belt to self assist LE as a leg lifter Assisted with amb in hallway.  General transfer comment: <25% VC's on proper hand placement and safety with turn completion.  General Gait Details: <25% VC's on safety with turns and upright posture.  Practiced one step/curb. General stair comments: 25% VC's on proper walker placement and proper sequencing.  Then returned to room to perform some TE's following HEP handout.  Instructed on proper tech, freq as well as use of ICE.   Addressed all mobility questions, discussed appropriate activity, educated on use of ICE.  Pt ready for D/C to home.    Follow Up Recommendations  Follow surgeon's recommendation for DC plan and follow-up therapies     Equipment Recommendations  Rolling walker with 5" wheels    Recommendations for Other Services       Precautions / Restrictions Precautions Precautions: Fall Restrictions Weight Bearing Restrictions: No Other Position/Activity Restrictions: WBAT    Mobility  Bed Mobility Overal bed mobility: Needs Assistance Bed Mobility: Supine to Sit     Supine to sit: Min assist     General bed mobility comments: assist for trunk and R LE.  demonstarted and instructed how to use belt to self assist LE.  Transfers Overall transfer level: Needs assistance Equipment used: Rolling walker (2 wheeled) Transfers: Sit to/from Stand Sit to Stand: Supervision;Min guard         General transfer comment: <25% VC's on proper hand placement and safety with turn completion  Ambulation/Gait Ambulation/Gait assistance: Min guard Gait Distance (Feet): 43 Feet Assistive device: Rolling walker (2  wheeled) Gait Pattern/deviations: Step-to pattern;Step-through pattern;Decreased stance time - right Gait velocity: decreased   General Gait Details: <25% VC's on safety with turns and upright posture   Stairs Stairs: Yes Stairs assistance: Supervision;Min guard Stair Management: No rails;Step to pattern;Forwards Number of Stairs: 1 General stair comments: 25% VC's on proper walker placement and proper sequencing   Wheelchair Mobility    Modified Rankin (Stroke Patients Only)       Balance                                            Cognition Arousal/Alertness: Awake/alert Behavior During Therapy: WFL for tasks assessed/performed Overall Cognitive Status: Within Functional Limits for tasks assessed                                        Exercises   Total Hip Replacement TE's 10 reps ankle pumps 10 reps knee presses 10 reps heel slides 10 reps SAQ's 10 reps ABD Followed by ICE     General Comments        Pertinent Vitals/Pain Pain Assessment: 0-10 Pain Score: 6  Pain Location: L hip Pain Descriptors / Indicators: Tender;Tightness;Grimacing Pain Intervention(s): Monitored during session;Premedicated before session;Repositioned;Ice applied    Home Living  Prior Function            PT Goals (current goals can now be found in the care plan section) Progress towards PT goals: Progressing toward goals    Frequency    7X/week      PT Plan Current plan remains appropriate    Co-evaluation              AM-PAC PT "6 Clicks" Mobility   Outcome Measure  Help needed turning from your back to your side while in a flat bed without using bedrails?: A Little Help needed moving from lying on your back to sitting on the side of a flat bed without using bedrails?: A Little Help needed moving to and from a bed to a chair (including a wheelchair)?: A Little Help needed standing up from a  chair using your arms (e.g., wheelchair or bedside chair)?: A Little Help needed to walk in hospital room?: A Little Help needed climbing 3-5 steps with a railing? : A Little 6 Click Score: 18    End of Session Equipment Utilized During Treatment: Gait belt Activity Tolerance: Patient tolerated treatment well Patient left: in chair;with call bell/phone within reach Nurse Communication: (pt ready for D/C to home after one session) PT Visit Diagnosis: Muscle weakness (generalized) (M62.81);Pain;Other abnormalities of gait and mobility (R26.89) Pain - Right/Left: Left Pain - part of body: Hip     Time: 1030-1055 PT Time Calculation (min) (ACUTE ONLY): 25 min  Charges:  $Gait Training: 8-22 mins $Therapeutic Exercise: 8-22 mins                     Rica Koyanagi  PTA Acute  Rehabilitation Services Pager      [phone] Office      [phone]

## 2019-04-02 NOTE — Care Management CC44 (Signed)
Condition Code 44 Documentation Completed  Patient Details  Name: Franklin Fisher MRN: [id number] Date of Birth: 11-Feb-1941   Condition Code 44 given:  Yes Patient signature on Condition Code 44 notice:  Yes Documentation of 2 MD's agreement:  Yes Code 44 added to claim:  Yes    Lia Hopping, [place] 04/02/2019, 12:23 PM

## 2019-04-02 NOTE — Care Management Obs Status (Signed)
[place] NOTIFICATION   Patient Details  Name: Demorris Choyce MRN: [id number] Date of Birth: [birth year]   Medicare Observation Status Notification Given:  Yes    Lia Hopping, [place] 04/02/2019, 12:23 PM

## 2019-04-02 NOTE — Plan of Care (Signed)
  Problem: Education: Goal: Knowledge of the prescribed therapeutic regimen will improve Outcome: Adequate for Discharge Goal: Understanding of discharge needs will improve Outcome: Adequate for Discharge Goal: Individualized Educational Video(s) Outcome: Adequate for Discharge   Problem: Activity: Goal: Ability to avoid complications of mobility impairment will improve Outcome: Adequate for Discharge Goal: Ability to tolerate increased activity will improve Outcome: Adequate for Discharge   Problem: Clinical Measurements: Goal: Postoperative complications will be avoided or minimized Outcome: Adequate for Discharge   Problem: Pain Management: Goal: Pain level will decrease with appropriate interventions Outcome: Adequate for Discharge   Problem: Skin Integrity: Goal: Will show signs of wound healing Outcome: Adequate for Discharge   Problem: Education: Goal: Knowledge of General Education information will improve Description: Including pain rating scale, medication(s)/side effects and non-pharmacologic comfort measures Outcome: Adequate for Discharge   Problem: Health Behavior/Discharge Planning: Goal: Ability to manage health-related needs will improve Outcome: Adequate for Discharge   Problem: Clinical Measurements: Goal: Ability to maintain clinical measurements within normal limits will improve Outcome: Adequate for Discharge Goal: Will remain free from infection Outcome: Adequate for Discharge Goal: Diagnostic test results will improve Outcome: Adequate for Discharge Goal: Respiratory complications will improve Outcome: Adequate for Discharge Goal: Cardiovascular complication will be avoided Outcome: Adequate for Discharge   Problem: Activity: Goal: Risk for activity intolerance will decrease Outcome: Adequate for Discharge   Problem: Nutrition: Goal: Adequate nutrition will be maintained Outcome: Adequate for Discharge   Problem: Coping: Goal: Level of  anxiety will decrease Outcome: Adequate for Discharge   Problem: Elimination: Goal: Will not experience complications related to bowel motility Outcome: Adequate for Discharge Goal: Will not experience complications related to urinary retention Outcome: Adequate for Discharge   Problem: Pain Managment: Goal: General experience of comfort will improve Outcome: Adequate for Discharge   Problem: Safety: Goal: Ability to remain free from injury will improve Outcome: Adequate for Discharge   Problem: Skin Integrity: Goal: Risk for impaired skin integrity will decrease Outcome: Adequate for Discharge  Home with a friend today.  Discharge teaching done, written information given.

## 2019-04-02 NOTE — Progress Notes (Signed)
PATIENT ID: Franklin Fisher  MRN: [id number]  DOB/AGE:  1941/03/14 / 78 y.o.  1 Day Post-Op Procedure(s) (LRB): LEFT TOTAL HIP ARTHROPLASTY ANTERIOR APPROACH (Left)    PROGRESS NOTE Subjective: Patient is alert, oriented, no Nausea, no Vomiting, yes passing gas, . Taking PO well. Denies SOB, Chest or Calf Pain. Using Incentive Spirometer, PAS in place. Ambulate 36' Patient reports pain as  1/10  .    Objective: Vital signs in last 24 hours: Vitals:   04/01/19 2041 04/01/19 2135 04/02/19 0212 04/02/19 0538  BP:  112/66 (Abnormal) 110/53 (Abnormal) 111/54  Pulse:  66 62 62  Resp: 18 18 18 16   Temp:  97.7 F (36.5 C) 98 F (36.7 C) 97.9 F (36.6 C)  TempSrc:  Oral Oral Oral  SpO2:  100% 98% 100%  Weight:      Height:          Intake/Output from previous day: I/O last 3 completed shifts: In: 4186.9 [P.O.:600; I.V.:3336.9; IV Piggyback:250] Out: 2625 [Urine:2225; Blood:400]   Intake/Output this shift: No intake/output data recorded.   LABORATORY DATA: Recent Labs    04/02/19 0334  WBC 10.5  HGB 12.9*  HCT 38.7*  PLT 127*  NA 137  K 4.0  CL 105  CO2 25  BUN 15  CREATININE 1.02  GLUCOSE 152*  CALCIUM 8.6*    Examination: Neurologically intact ABD soft Neurovascular intact Sensation intact distally Intact pulses distally Dorsiflexion/Plantar flexion intact Incision: dressing C/D/I No cellulitis present Compartment soft} XR AP&Lat of hip shows well placed\fixed THA  Assessment:   1 Day Post-Op Procedure(s) (LRB): LEFT TOTAL HIP ARTHROPLASTY ANTERIOR APPROACH (Left) ADDITIONAL DIAGNOSIS:  Expected Acute Blood Loss Anemia, Hypertension  Patient's anticipated LOS is less than 2 midnights, meeting these requirements: - Younger than 30 - Lives within 1 hour of care - Has a competent adult at home to recover with post-op recover - NO history of  - Chronic pain requiring opiods  - Diabetes  - Coronary Artery Disease  - Heart failure  - Heart attack  -  Stroke  - DVT/VTE  - Cardiac arrhythmia  - Respiratory Failure/COPD  - Renal failure  - Anemia  - Advanced Liver disease       Plan: PT/OT WBAT, THA  DVT Prophylaxis: SCDx72 hrs, ASA 81 mg BID x 2 weeks  DISCHARGE PLAN: Home, today  DISCHARGE NEEDS: HHPT, Walker and 3-in-1 comode seat

## 2019-04-03 DIAGNOSIS — N400 Enlarged prostate without lower urinary tract symptoms: Secondary | ICD-10-CM | POA: Diagnosis not present

## 2019-04-03 DIAGNOSIS — Z96642 Presence of left artificial hip joint: Secondary | ICD-10-CM | POA: Diagnosis not present

## 2019-04-03 DIAGNOSIS — Z8673 Personal history of transient ischemic attack (TIA), and cerebral infarction without residual deficits: Secondary | ICD-10-CM | POA: Diagnosis not present

## 2019-04-03 DIAGNOSIS — M1611 Unilateral primary osteoarthritis, right hip: Secondary | ICD-10-CM | POA: Diagnosis not present

## 2019-04-03 DIAGNOSIS — I10 Essential (primary) hypertension: Secondary | ICD-10-CM | POA: Diagnosis not present

## 2019-04-03 DIAGNOSIS — Z471 Aftercare following joint replacement surgery: Secondary | ICD-10-CM | POA: Diagnosis not present

## 2019-04-03 DIAGNOSIS — G4730 Sleep apnea, unspecified: Secondary | ICD-10-CM | POA: Diagnosis not present

## 2019-04-06 DIAGNOSIS — G4730 Sleep apnea, unspecified: Secondary | ICD-10-CM | POA: Diagnosis not present

## 2019-04-06 DIAGNOSIS — N400 Enlarged prostate without lower urinary tract symptoms: Secondary | ICD-10-CM | POA: Diagnosis not present

## 2019-04-06 DIAGNOSIS — M1611 Unilateral primary osteoarthritis, right hip: Secondary | ICD-10-CM | POA: Diagnosis not present

## 2019-04-06 DIAGNOSIS — Z8673 Personal history of transient ischemic attack (TIA), and cerebral infarction without residual deficits: Secondary | ICD-10-CM | POA: Diagnosis not present

## 2019-04-06 DIAGNOSIS — Z471 Aftercare following joint replacement surgery: Secondary | ICD-10-CM | POA: Diagnosis not present

## 2019-04-06 DIAGNOSIS — I10 Essential (primary) hypertension: Secondary | ICD-10-CM | POA: Diagnosis not present

## 2019-04-08 DIAGNOSIS — Z8673 Personal history of transient ischemic attack (TIA), and cerebral infarction without residual deficits: Secondary | ICD-10-CM | POA: Diagnosis not present

## 2019-04-08 DIAGNOSIS — G4730 Sleep apnea, unspecified: Secondary | ICD-10-CM | POA: Diagnosis not present

## 2019-04-08 DIAGNOSIS — N400 Enlarged prostate without lower urinary tract symptoms: Secondary | ICD-10-CM | POA: Diagnosis not present

## 2019-04-08 DIAGNOSIS — M1611 Unilateral primary osteoarthritis, right hip: Secondary | ICD-10-CM | POA: Diagnosis not present

## 2019-04-08 DIAGNOSIS — Z471 Aftercare following joint replacement surgery: Secondary | ICD-10-CM | POA: Diagnosis not present

## 2019-04-08 DIAGNOSIS — I10 Essential (primary) hypertension: Secondary | ICD-10-CM | POA: Diagnosis not present

## 2019-04-10 DIAGNOSIS — G4730 Sleep apnea, unspecified: Secondary | ICD-10-CM | POA: Diagnosis not present

## 2019-04-10 DIAGNOSIS — N400 Enlarged prostate without lower urinary tract symptoms: Secondary | ICD-10-CM | POA: Diagnosis not present

## 2019-04-10 DIAGNOSIS — Z8673 Personal history of transient ischemic attack (TIA), and cerebral infarction without residual deficits: Secondary | ICD-10-CM | POA: Diagnosis not present

## 2019-04-10 DIAGNOSIS — I10 Essential (primary) hypertension: Secondary | ICD-10-CM | POA: Diagnosis not present

## 2019-04-10 DIAGNOSIS — Z471 Aftercare following joint replacement surgery: Secondary | ICD-10-CM | POA: Diagnosis not present

## 2019-04-10 DIAGNOSIS — M1611 Unilateral primary osteoarthritis, right hip: Secondary | ICD-10-CM | POA: Diagnosis not present

## 2019-04-10 DIAGNOSIS — R079 Chest pain, unspecified: Secondary | ICD-10-CM | POA: Diagnosis not present

## 2019-04-12 DIAGNOSIS — Z471 Aftercare following joint replacement surgery: Secondary | ICD-10-CM | POA: Diagnosis not present

## 2019-04-12 DIAGNOSIS — I10 Essential (primary) hypertension: Secondary | ICD-10-CM | POA: Diagnosis not present

## 2019-04-12 DIAGNOSIS — G4730 Sleep apnea, unspecified: Secondary | ICD-10-CM | POA: Diagnosis not present

## 2019-04-12 DIAGNOSIS — M1611 Unilateral primary osteoarthritis, right hip: Secondary | ICD-10-CM | POA: Diagnosis not present

## 2019-04-12 DIAGNOSIS — Z8673 Personal history of transient ischemic attack (TIA), and cerebral infarction without residual deficits: Secondary | ICD-10-CM | POA: Diagnosis not present

## 2019-04-12 DIAGNOSIS — N400 Enlarged prostate without lower urinary tract symptoms: Secondary | ICD-10-CM | POA: Diagnosis not present

## 2019-04-15 DIAGNOSIS — G4730 Sleep apnea, unspecified: Secondary | ICD-10-CM | POA: Diagnosis not present

## 2019-04-15 DIAGNOSIS — N400 Enlarged prostate without lower urinary tract symptoms: Secondary | ICD-10-CM | POA: Diagnosis not present

## 2019-04-15 DIAGNOSIS — Z471 Aftercare following joint replacement surgery: Secondary | ICD-10-CM | POA: Diagnosis not present

## 2019-04-15 DIAGNOSIS — M1611 Unilateral primary osteoarthritis, right hip: Secondary | ICD-10-CM | POA: Diagnosis not present

## 2019-04-15 DIAGNOSIS — I10 Essential (primary) hypertension: Secondary | ICD-10-CM | POA: Diagnosis not present

## 2019-04-15 DIAGNOSIS — Z8673 Personal history of transient ischemic attack (TIA), and cerebral infarction without residual deficits: Secondary | ICD-10-CM | POA: Diagnosis not present

## 2019-04-16 DIAGNOSIS — Z471 Aftercare following joint replacement surgery: Secondary | ICD-10-CM | POA: Diagnosis not present

## 2019-04-16 DIAGNOSIS — M1612 Unilateral primary osteoarthritis, left hip: Secondary | ICD-10-CM | POA: Diagnosis not present

## 2019-04-16 DIAGNOSIS — Z96642 Presence of left artificial hip joint: Secondary | ICD-10-CM | POA: Diagnosis not present

## 2019-04-16 NOTE — H&P (Signed)
TOTAL HIP ADMISSION H&P  Patient is admitted for left total hip arthroplasty.  Subjective:  Chief Complaint: left hip pain  HPI: Franklin Fisher, 78 y.o. male, has a history of pain and functional disability in the left hip(s) due to arthritis and patient has failed non-surgical conservative treatments for greater than 12 weeks to include NSAID's and/or analgesics, corticosteriod injections, use of assistive devices and activity modification.  Onset of symptoms was gradual starting 2 years ago with gradually worsening course since that time.The patient noted no past surgery on the right hip(s).  Patient currently rates pain in the left hip at 10 out of 10 with activity. Patient has night pain, worsening of pain with activity and weight bearing, pain that interfers with activities of daily living and pain with passive range of motion. Patient has evidence of periarticular osteophytes and joint space narrowing by imaging studies. This condition presents safety issues increasing the risk of falls.   There is no current active infection.  Patient Active Problem List   Diagnosis Date Noted  . History of total hip arthroplasty, left 04/01/2019  . Osteoarthritis of right hip 03/29/2019  . Preop cardiovascular exam 03/28/2019  . Abnormal electrocardiogram (ECG) (EKG) 03/28/2019  . Essential hypertension 03/28/2019   Past Medical History:  Diagnosis Date  . BPH (benign prostatic hyperplasia)   . Headache    stopped at age 31  . History of kidney stones    passes 1-2 per year  . Hypertension 2000   Dr. Orland Mustard  . Sleep apnea   . Stroke [facility])    TIA cat scans and MRI were WNL  . TIA (transient ischemic attack)     Past Surgical History:  Procedure Laterality Date  . CATARACT EXTRACTION, BILATERAL Bilateral 2015  . CHOLECYSTECTOMY    . HERNIA REPAIR     bi lat inguinal  . HERNIA REPAIR    . TONSILLECTOMY     age 56  . TOTAL HIP ARTHROPLASTY Left 04/01/2019   Procedure: LEFT TOTAL HIP  ARTHROPLASTY ANTERIOR APPROACH;  Surgeon: Frederik Pear, MD;  Location: [facility];  Service: Orthopedics;  Laterality: Left;    Current Facility-Administered Medications  Medication Dose Route Frequency Provider Last Rate Last Dose  . diclofenac sodium (VOLTAREN) 1 % transdermal gel 2 g  2 g Topical QID Pete Pelt, PA-C       Current Outpatient Medications  Medication Sig Dispense Refill Last Dose  . atenolol (TENORMIN) 100 MG tablet Take 100 mg by mouth daily.   04/01/2019 at 0330  . clotrimazole-betamethasone (LOTRISONE) cream Apply 1 application topically daily as needed for itching.   Past Month at Unknown time  . ketoconazole (NIZORAL) 2 % shampoo Apply 1 application topically daily as needed for irritation.    03/31/2019 at Unknown time  . tamsulosin (FLOMAX) 0.4 MG CAPS capsule Take 0.8 mg by mouth daily.    04/01/2019 at 0330  . albuterol (PROVENTIL HFA;VENTOLIN HFA) 108 (90 Base) MCG/ACT inhaler Inhale 2 puffs into the lungs every 6 (six) hours as needed for wheezing or shortness of breath.   More than a month at Unknown time  . aspirin EC 81 MG tablet Take 1 tablet (81 mg total) by mouth 2 (two) times daily. 60 tablet 0   . oxyCODONE-acetaminophen (PERCOCET) 10-325 MG tablet Take 1 tablet by mouth every 6 (six) hours as needed for pain. 30 tablet 0   . tiZANidine (ZANAFLEX) 2 MG tablet Take 1 tablet (2 mg total) by mouth every  6 (six) hours as needed. 60 tablet 0    Allergies  Allergen Reactions  . Ivp Dye [Iodinated Diagnostic Agents] Hives    Social History   Tobacco Use  . Smoking status: Never Smoker  . Smokeless tobacco: Never Used  Substance Use Topics  . Alcohol use: Not Currently    Family History  Problem Relation Age of Onset  . Breast cancer Sister   . Atrial fibrillation Mother   . Congestive Heart Failure Mother   . CAD Father      Review of Systems  Constitutional: Negative.   HENT: Positive for tinnitus.   Eyes: Negative.   Respiratory: Negative.    Cardiovascular:       Htn  Gastrointestinal: Negative.   Genitourinary:       Kidney stones  Musculoskeletal: Positive for joint pain.  Skin: Negative.   Neurological: Negative.   Endo/Heme/Allergies: Negative.   Psychiatric/Behavioral: Negative.     Objective:  Physical Exam  Constitutional: He is oriented to person, place, and time. He appears well-developed and well-nourished.  HENT:  Head: Normocephalic and atraumatic.  Eyes: Pupils are equal, round, and reactive to light.  Neck: Normal range of motion. Neck supple.  Cardiovascular: Normal rate and regular rhythm.  Respiratory: Effort normal.  Musculoskeletal:        General: Tenderness present.     Comments: the patient continues to have discomfort with internal and external rotation of bilateral hips.  Neurological: He is alert and oriented to person, place, and time.  Skin: Skin is warm and dry.  Psychiatric: He has a normal mood and affect. His behavior is normal. Judgment and thought content normal.    Vital signs in last 24 hours:    Labs:   Estimated body mass index is 33.27 kg/m as calculated from the following:   Height as of this encounter: 5\' 5"  (1.651 m).   Weight as of this encounter: 90.7 kg.   Imaging Review Plain radiographs demonstrate AP of the pelvis and crosstable lateral of the left hip is taken and reviewed in office today.  Patient does have near bone-on-bone arthritis of the right hip superior weightbearing surface and severe arthritis left hip with periarticular osteophyte formation.      Assessment/Plan:  End stage arthritis, left hip(s)  The patient history, physical examination, clinical judgement of the provider and imaging studies are consistent with end stage degenerative joint disease of the left hip(s) and total hip arthroplasty is deemed medically necessary. The treatment options including medical management, injection therapy, arthroscopy and arthroplasty were discussed at  length. The risks and benefits of total hip arthroplasty were presented and reviewed. The risks due to aseptic loosening, infection, stiffness, dislocation/subluxation,  thromboembolic complications and other imponderables were discussed.  The patient acknowledged the explanation, agreed to proceed with the plan and consent was signed. Patient is being admitted for inpatient treatment for surgery, pain control, PT, OT, prophylactic antibiotics, VTE prophylaxis, progressive ambulation and ADL's and discharge planning.The patient is planning to be discharged home with home health services   Anticipated LOS equal to or greater than 2 midnights due to - Age 74 and older with one or more of the following:  - Obesity  - Expected need for hospital services (PT, OT, Nursing) required for safe  discharge  - Anticipated need for postoperative skilled nursing care or inpatient rehab  - Active co-morbidities: Coronary Artery Disease

## 2019-04-17 DIAGNOSIS — G4730 Sleep apnea, unspecified: Secondary | ICD-10-CM | POA: Diagnosis not present

## 2019-04-17 DIAGNOSIS — Z8673 Personal history of transient ischemic attack (TIA), and cerebral infarction without residual deficits: Secondary | ICD-10-CM | POA: Diagnosis not present

## 2019-04-17 DIAGNOSIS — I10 Essential (primary) hypertension: Secondary | ICD-10-CM | POA: Diagnosis not present

## 2019-04-17 DIAGNOSIS — M1611 Unilateral primary osteoarthritis, right hip: Secondary | ICD-10-CM | POA: Diagnosis not present

## 2019-04-17 DIAGNOSIS — Z471 Aftercare following joint replacement surgery: Secondary | ICD-10-CM | POA: Diagnosis not present

## 2019-04-17 DIAGNOSIS — N400 Enlarged prostate without lower urinary tract symptoms: Secondary | ICD-10-CM | POA: Diagnosis not present

## 2019-04-18 DIAGNOSIS — N400 Enlarged prostate without lower urinary tract symptoms: Secondary | ICD-10-CM | POA: Diagnosis not present

## 2019-04-18 DIAGNOSIS — I10 Essential (primary) hypertension: Secondary | ICD-10-CM | POA: Diagnosis not present

## 2019-04-18 DIAGNOSIS — Z8673 Personal history of transient ischemic attack (TIA), and cerebral infarction without residual deficits: Secondary | ICD-10-CM | POA: Diagnosis not present

## 2019-04-18 DIAGNOSIS — M1611 Unilateral primary osteoarthritis, right hip: Secondary | ICD-10-CM | POA: Diagnosis not present

## 2019-04-18 DIAGNOSIS — Z471 Aftercare following joint replacement surgery: Secondary | ICD-10-CM | POA: Diagnosis not present

## 2019-04-18 DIAGNOSIS — G4730 Sleep apnea, unspecified: Secondary | ICD-10-CM | POA: Diagnosis not present

## 2019-04-22 DIAGNOSIS — Z471 Aftercare following joint replacement surgery: Secondary | ICD-10-CM | POA: Diagnosis not present

## 2019-04-22 DIAGNOSIS — N400 Enlarged prostate without lower urinary tract symptoms: Secondary | ICD-10-CM | POA: Diagnosis not present

## 2019-04-22 DIAGNOSIS — I10 Essential (primary) hypertension: Secondary | ICD-10-CM | POA: Diagnosis not present

## 2019-04-22 DIAGNOSIS — G4730 Sleep apnea, unspecified: Secondary | ICD-10-CM | POA: Diagnosis not present

## 2019-04-22 DIAGNOSIS — Z8673 Personal history of transient ischemic attack (TIA), and cerebral infarction without residual deficits: Secondary | ICD-10-CM | POA: Diagnosis not present

## 2019-04-22 DIAGNOSIS — M1611 Unilateral primary osteoarthritis, right hip: Secondary | ICD-10-CM | POA: Diagnosis not present

## 2019-04-23 DIAGNOSIS — G894 Chronic pain syndrome: Secondary | ICD-10-CM | POA: Diagnosis not present

## 2019-04-23 DIAGNOSIS — Z79899 Other long term (current) drug therapy: Secondary | ICD-10-CM | POA: Diagnosis not present

## 2019-04-23 DIAGNOSIS — M169 Osteoarthritis of hip, unspecified: Secondary | ICD-10-CM | POA: Diagnosis not present

## 2019-04-23 DIAGNOSIS — Z79891 Long term (current) use of opiate analgesic: Secondary | ICD-10-CM | POA: Diagnosis not present

## 2019-04-23 DIAGNOSIS — M47816 Spondylosis without myelopathy or radiculopathy, lumbar region: Secondary | ICD-10-CM | POA: Diagnosis not present

## 2019-04-23 DIAGNOSIS — M79673 Pain in unspecified foot: Secondary | ICD-10-CM | POA: Diagnosis not present

## 2019-04-25 DIAGNOSIS — G4730 Sleep apnea, unspecified: Secondary | ICD-10-CM | POA: Diagnosis not present

## 2019-04-25 DIAGNOSIS — I10 Essential (primary) hypertension: Secondary | ICD-10-CM | POA: Diagnosis not present

## 2019-04-25 DIAGNOSIS — N400 Enlarged prostate without lower urinary tract symptoms: Secondary | ICD-10-CM | POA: Diagnosis not present

## 2019-04-25 DIAGNOSIS — M1611 Unilateral primary osteoarthritis, right hip: Secondary | ICD-10-CM | POA: Diagnosis not present

## 2019-04-25 DIAGNOSIS — Z471 Aftercare following joint replacement surgery: Secondary | ICD-10-CM | POA: Diagnosis not present

## 2019-04-25 DIAGNOSIS — Z8673 Personal history of transient ischemic attack (TIA), and cerebral infarction without residual deficits: Secondary | ICD-10-CM | POA: Diagnosis not present

## 2019-04-30 DIAGNOSIS — M25652 Stiffness of left hip, not elsewhere classified: Secondary | ICD-10-CM | POA: Diagnosis not present

## 2019-04-30 DIAGNOSIS — Z96642 Presence of left artificial hip joint: Secondary | ICD-10-CM | POA: Diagnosis not present

## 2019-04-30 DIAGNOSIS — R262 Difficulty in walking, not elsewhere classified: Secondary | ICD-10-CM | POA: Diagnosis not present

## 2019-05-07 DIAGNOSIS — M79673 Pain in unspecified foot: Secondary | ICD-10-CM | POA: Diagnosis not present

## 2019-05-07 DIAGNOSIS — M169 Osteoarthritis of hip, unspecified: Secondary | ICD-10-CM | POA: Diagnosis not present

## 2019-05-07 DIAGNOSIS — G894 Chronic pain syndrome: Secondary | ICD-10-CM | POA: Diagnosis not present

## 2019-05-07 DIAGNOSIS — M47816 Spondylosis without myelopathy or radiculopathy, lumbar region: Secondary | ICD-10-CM | POA: Diagnosis not present

## 2019-05-13 DIAGNOSIS — L218 Other seborrheic dermatitis: Secondary | ICD-10-CM | POA: Diagnosis not present

## 2019-05-13 DIAGNOSIS — L570 Actinic keratosis: Secondary | ICD-10-CM | POA: Diagnosis not present

## 2019-05-14 DIAGNOSIS — Z471 Aftercare following joint replacement surgery: Secondary | ICD-10-CM | POA: Diagnosis not present

## 2019-05-14 DIAGNOSIS — Z96642 Presence of left artificial hip joint: Secondary | ICD-10-CM | POA: Diagnosis not present

## 2019-05-24 DIAGNOSIS — Z96649 Presence of unspecified artificial hip joint: Secondary | ICD-10-CM | POA: Diagnosis not present

## 2019-05-24 DIAGNOSIS — G4733 Obstructive sleep apnea (adult) (pediatric): Secondary | ICD-10-CM | POA: Diagnosis not present

## 2019-06-04 DIAGNOSIS — Z79891 Long term (current) use of opiate analgesic: Secondary | ICD-10-CM | POA: Diagnosis not present

## 2019-06-04 DIAGNOSIS — M79673 Pain in unspecified foot: Secondary | ICD-10-CM | POA: Diagnosis not present

## 2019-06-04 DIAGNOSIS — G894 Chronic pain syndrome: Secondary | ICD-10-CM | POA: Diagnosis not present

## 2019-06-04 DIAGNOSIS — M47816 Spondylosis without myelopathy or radiculopathy, lumbar region: Secondary | ICD-10-CM | POA: Diagnosis not present

## 2019-06-04 DIAGNOSIS — M169 Osteoarthritis of hip, unspecified: Secondary | ICD-10-CM | POA: Diagnosis not present

## 2019-06-04 DIAGNOSIS — Z79899 Other long term (current) drug therapy: Secondary | ICD-10-CM | POA: Diagnosis not present

## 2019-06-11 DIAGNOSIS — L218 Other seborrheic dermatitis: Secondary | ICD-10-CM | POA: Diagnosis not present

## 2019-06-11 DIAGNOSIS — L820 Inflamed seborrheic keratosis: Secondary | ICD-10-CM | POA: Diagnosis not present

## 2019-06-21 DIAGNOSIS — N5089 Other specified disorders of the male genital organs: Secondary | ICD-10-CM | POA: Diagnosis not present

## 2019-06-21 DIAGNOSIS — N50811 Right testicular pain: Secondary | ICD-10-CM | POA: Diagnosis not present

## 2019-06-25 DIAGNOSIS — G4733 Obstructive sleep apnea (adult) (pediatric): Secondary | ICD-10-CM | POA: Diagnosis not present

## 2019-07-01 DIAGNOSIS — Z23 Encounter for immunization: Secondary | ICD-10-CM | POA: Diagnosis not present

## 2019-07-01 DIAGNOSIS — L309 Dermatitis, unspecified: Secondary | ICD-10-CM | POA: Diagnosis not present

## 2019-07-02 DIAGNOSIS — M47816 Spondylosis without myelopathy or radiculopathy, lumbar region: Secondary | ICD-10-CM | POA: Diagnosis not present

## 2019-07-02 DIAGNOSIS — M25569 Pain in unspecified knee: Secondary | ICD-10-CM | POA: Diagnosis not present

## 2019-07-02 DIAGNOSIS — M169 Osteoarthritis of hip, unspecified: Secondary | ICD-10-CM | POA: Diagnosis not present

## 2019-07-02 DIAGNOSIS — G894 Chronic pain syndrome: Secondary | ICD-10-CM | POA: Diagnosis not present

## 2019-07-04 DIAGNOSIS — L739 Follicular disorder, unspecified: Secondary | ICD-10-CM | POA: Diagnosis not present

## 2019-07-04 DIAGNOSIS — G479 Sleep disorder, unspecified: Secondary | ICD-10-CM | POA: Diagnosis not present

## 2019-07-10 DIAGNOSIS — I8393 Asymptomatic varicose veins of bilateral lower extremities: Secondary | ICD-10-CM | POA: Diagnosis not present

## 2019-07-11 DIAGNOSIS — L300 Nummular dermatitis: Secondary | ICD-10-CM | POA: Diagnosis not present

## 2019-07-17 DIAGNOSIS — I87391 Chronic venous hypertension (idiopathic) with other complications of right lower extremity: Secondary | ICD-10-CM | POA: Diagnosis not present

## 2019-07-18 DIAGNOSIS — I87391 Chronic venous hypertension (idiopathic) with other complications of right lower extremity: Secondary | ICD-10-CM | POA: Diagnosis not present

## 2019-07-24 DIAGNOSIS — I87391 Chronic venous hypertension (idiopathic) with other complications of right lower extremity: Secondary | ICD-10-CM | POA: Diagnosis not present

## 2019-07-24 DIAGNOSIS — I8311 Varicose veins of right lower extremity with inflammation: Secondary | ICD-10-CM | POA: Diagnosis not present

## 2019-07-31 DIAGNOSIS — G894 Chronic pain syndrome: Secondary | ICD-10-CM | POA: Diagnosis not present

## 2019-07-31 DIAGNOSIS — M169 Osteoarthritis of hip, unspecified: Secondary | ICD-10-CM | POA: Diagnosis not present

## 2019-07-31 DIAGNOSIS — M47816 Spondylosis without myelopathy or radiculopathy, lumbar region: Secondary | ICD-10-CM | POA: Diagnosis not present

## 2019-07-31 DIAGNOSIS — M25569 Pain in unspecified knee: Secondary | ICD-10-CM | POA: Diagnosis not present

## 2019-08-07 DIAGNOSIS — M25552 Pain in left hip: Secondary | ICD-10-CM | POA: Diagnosis not present

## 2019-08-09 DIAGNOSIS — I10 Essential (primary) hypertension: Secondary | ICD-10-CM | POA: Diagnosis not present

## 2019-08-09 DIAGNOSIS — R7309 Other abnormal glucose: Secondary | ICD-10-CM | POA: Diagnosis not present

## 2019-08-09 DIAGNOSIS — N400 Enlarged prostate without lower urinary tract symptoms: Secondary | ICD-10-CM | POA: Diagnosis not present

## 2019-08-27 DIAGNOSIS — G894 Chronic pain syndrome: Secondary | ICD-10-CM | POA: Diagnosis not present

## 2019-08-27 DIAGNOSIS — M25569 Pain in unspecified knee: Secondary | ICD-10-CM | POA: Diagnosis not present

## 2019-08-27 DIAGNOSIS — L853 Xerosis cutis: Secondary | ICD-10-CM | POA: Diagnosis not present

## 2019-08-27 DIAGNOSIS — Z79899 Other long term (current) drug therapy: Secondary | ICD-10-CM | POA: Diagnosis not present

## 2019-08-27 DIAGNOSIS — Z79891 Long term (current) use of opiate analgesic: Secondary | ICD-10-CM | POA: Diagnosis not present

## 2019-08-27 DIAGNOSIS — L218 Other seborrheic dermatitis: Secondary | ICD-10-CM | POA: Diagnosis not present

## 2019-08-27 DIAGNOSIS — M79606 Pain in leg, unspecified: Secondary | ICD-10-CM | POA: Diagnosis not present

## 2019-08-27 DIAGNOSIS — M47816 Spondylosis without myelopathy or radiculopathy, lumbar region: Secondary | ICD-10-CM | POA: Diagnosis not present

## 2019-08-30 ENCOUNTER — Encounter: Payer: Medicare Other | Admitting: Vascular Surgery

## 2019-08-30 ENCOUNTER — Encounter (HOSPITAL_COMMUNITY): Payer: Medicare Other

## 2019-09-24 DIAGNOSIS — M169 Osteoarthritis of hip, unspecified: Secondary | ICD-10-CM | POA: Diagnosis not present

## 2019-09-24 DIAGNOSIS — M47816 Spondylosis without myelopathy or radiculopathy, lumbar region: Secondary | ICD-10-CM | POA: Diagnosis not present

## 2019-09-24 DIAGNOSIS — G894 Chronic pain syndrome: Secondary | ICD-10-CM | POA: Diagnosis not present

## 2019-09-24 DIAGNOSIS — M79606 Pain in leg, unspecified: Secondary | ICD-10-CM | POA: Diagnosis not present

## 2019-10-23 DIAGNOSIS — L03211 Cellulitis of face: Secondary | ICD-10-CM | POA: Diagnosis not present

## 2019-10-25 DIAGNOSIS — R238 Other skin changes: Secondary | ICD-10-CM | POA: Diagnosis not present

## 2019-10-28 ENCOUNTER — Ambulatory Visit: Payer: Medicare Other | Attending: Internal Medicine

## 2019-10-28 DIAGNOSIS — Z23 Encounter for immunization: Secondary | ICD-10-CM | POA: Diagnosis not present

## 2019-10-28 NOTE — Progress Notes (Signed)
   Covid-19 Vaccination Clinic  Name:  Franklin Fisher    MRN: [id number] DOB: [birth year]  10/28/2019  Franklin Fisher was observed post Covid-19 immunization for 15 minutes without incidence. He was provided with Vaccine Information Sheet and instruction to access the V-Safe system.   Franklin Fisher was instructed to call 911 with any severe reactions post vaccine: Marland Kitchen Difficulty breathing  . Swelling of your face and throat  . A fast heartbeat  . A bad rash all over your body  . Dizziness and weakness    Immunizations Administered    Name Date Dose VIS Date Route   Pfizer COVID-19 Vaccine 10/28/2019 11:28 AM 0.3 mL 09/27/2019 Intramuscular   Manufacturer: [employer]   Lot: [id number]   [place]: [id number]

## 2019-11-01 DIAGNOSIS — L570 Actinic keratosis: Secondary | ICD-10-CM | POA: Diagnosis not present

## 2019-11-01 DIAGNOSIS — L820 Inflamed seborrheic keratosis: Secondary | ICD-10-CM | POA: Diagnosis not present

## 2019-11-01 DIAGNOSIS — L218 Other seborrheic dermatitis: Secondary | ICD-10-CM | POA: Diagnosis not present

## 2019-11-01 DIAGNOSIS — L300 Nummular dermatitis: Secondary | ICD-10-CM | POA: Diagnosis not present

## 2019-11-08 DIAGNOSIS — H579 Unspecified disorder of eye and adnexa: Secondary | ICD-10-CM | POA: Diagnosis not present

## 2019-11-08 DIAGNOSIS — H1132 Conjunctival hemorrhage, left eye: Secondary | ICD-10-CM | POA: Diagnosis not present

## 2019-11-12 DIAGNOSIS — H1132 Conjunctival hemorrhage, left eye: Secondary | ICD-10-CM | POA: Diagnosis not present

## 2019-11-12 DIAGNOSIS — Z961 Presence of intraocular lens: Secondary | ICD-10-CM | POA: Diagnosis not present

## 2019-11-15 ENCOUNTER — Ambulatory Visit: Payer: Medicare Other

## 2019-11-16 ENCOUNTER — Ambulatory Visit: Payer: Medicare Other | Attending: Internal Medicine

## 2019-11-16 DIAGNOSIS — Z23 Encounter for immunization: Secondary | ICD-10-CM | POA: Insufficient documentation

## 2019-11-16 NOTE — Progress Notes (Signed)
   Covid-19 Vaccination Clinic  Name:  Franklin Fisher    MRN: [id number] DOB: 23-May-1941  11/16/2019  [name] was observed post Covid-19 immunization for 15 minutes without incidence. He was provided with Vaccine Information Sheet and instruction to access the V-Safe system.   [name] was instructed to call 911 with any severe reactions post vaccine: Marland Kitchen Difficulty breathing  . Swelling of your face and throat  . A fast heartbeat  . A bad rash all over your body  . Dizziness and weakness    Immunizations Administered    Name Date Dose VIS Date Route   Pfizer COVID-19 Vaccine 11/16/2019 11:55 AM 0.3 mL 09/27/2019 Intramuscular   Manufacturer: [place]   Lot: [id number]   [place]: [id number]

## 2019-12-02 DIAGNOSIS — L298 Other pruritus: Secondary | ICD-10-CM | POA: Diagnosis not present

## 2019-12-02 DIAGNOSIS — L821 Other seborrheic keratosis: Secondary | ICD-10-CM | POA: Diagnosis not present

## 2019-12-02 DIAGNOSIS — L218 Other seborrheic dermatitis: Secondary | ICD-10-CM | POA: Diagnosis not present

## 2019-12-03 DIAGNOSIS — Z79899 Other long term (current) drug therapy: Secondary | ICD-10-CM | POA: Diagnosis not present

## 2019-12-03 DIAGNOSIS — M47816 Spondylosis without myelopathy or radiculopathy, lumbar region: Secondary | ICD-10-CM | POA: Diagnosis not present

## 2019-12-03 DIAGNOSIS — G894 Chronic pain syndrome: Secondary | ICD-10-CM | POA: Diagnosis not present

## 2019-12-03 DIAGNOSIS — M25569 Pain in unspecified knee: Secondary | ICD-10-CM | POA: Diagnosis not present

## 2019-12-03 DIAGNOSIS — Z79891 Long term (current) use of opiate analgesic: Secondary | ICD-10-CM | POA: Diagnosis not present

## 2019-12-03 DIAGNOSIS — M79606 Pain in leg, unspecified: Secondary | ICD-10-CM | POA: Diagnosis not present

## 2019-12-11 DIAGNOSIS — Z23 Encounter for immunization: Secondary | ICD-10-CM | POA: Diagnosis not present

## 2019-12-30 DIAGNOSIS — Z6836 Body mass index (BMI) 36.0-36.9, adult: Secondary | ICD-10-CM | POA: Diagnosis not present

## 2019-12-30 DIAGNOSIS — I10 Essential (primary) hypertension: Secondary | ICD-10-CM | POA: Diagnosis not present

## 2019-12-31 DIAGNOSIS — G894 Chronic pain syndrome: Secondary | ICD-10-CM | POA: Diagnosis not present

## 2019-12-31 DIAGNOSIS — M545 Low back pain: Secondary | ICD-10-CM | POA: Diagnosis not present

## 2019-12-31 DIAGNOSIS — M79606 Pain in leg, unspecified: Secondary | ICD-10-CM | POA: Diagnosis not present

## 2019-12-31 DIAGNOSIS — M47816 Spondylosis without myelopathy or radiculopathy, lumbar region: Secondary | ICD-10-CM | POA: Diagnosis not present

## 2020-01-14 DIAGNOSIS — S90415A Abrasion, left lesser toe(s), initial encounter: Secondary | ICD-10-CM | POA: Diagnosis not present

## 2020-01-16 DIAGNOSIS — B353 Tinea pedis: Secondary | ICD-10-CM | POA: Diagnosis not present

## 2020-01-20 DIAGNOSIS — M310 Hypersensitivity angiitis: Secondary | ICD-10-CM | POA: Diagnosis not present

## 2020-01-28 DIAGNOSIS — R7309 Other abnormal glucose: Secondary | ICD-10-CM | POA: Diagnosis not present

## 2020-01-28 DIAGNOSIS — M25569 Pain in unspecified knee: Secondary | ICD-10-CM | POA: Diagnosis not present

## 2020-01-28 DIAGNOSIS — G894 Chronic pain syndrome: Secondary | ICD-10-CM | POA: Diagnosis not present

## 2020-01-28 DIAGNOSIS — M79606 Pain in leg, unspecified: Secondary | ICD-10-CM | POA: Diagnosis not present

## 2020-01-28 DIAGNOSIS — M47816 Spondylosis without myelopathy or radiculopathy, lumbar region: Secondary | ICD-10-CM | POA: Diagnosis not present

## 2020-01-30 DIAGNOSIS — L309 Dermatitis, unspecified: Secondary | ICD-10-CM | POA: Diagnosis not present

## 2020-02-06 DIAGNOSIS — M1611 Unilateral primary osteoarthritis, right hip: Secondary | ICD-10-CM | POA: Diagnosis not present

## 2020-02-07 DIAGNOSIS — R7309 Other abnormal glucose: Secondary | ICD-10-CM | POA: Diagnosis not present

## 2020-02-07 DIAGNOSIS — R109 Unspecified abdominal pain: Secondary | ICD-10-CM | POA: Diagnosis not present

## 2020-02-07 DIAGNOSIS — R5383 Other fatigue: Secondary | ICD-10-CM | POA: Diagnosis not present

## 2020-02-07 DIAGNOSIS — G4733 Obstructive sleep apnea (adult) (pediatric): Secondary | ICD-10-CM | POA: Diagnosis not present

## 2020-02-07 DIAGNOSIS — Z79899 Other long term (current) drug therapy: Secondary | ICD-10-CM | POA: Diagnosis not present

## 2020-02-11 DIAGNOSIS — M25551 Pain in right hip: Secondary | ICD-10-CM | POA: Diagnosis not present

## 2020-02-25 DIAGNOSIS — M25569 Pain in unspecified knee: Secondary | ICD-10-CM | POA: Diagnosis not present

## 2020-02-25 DIAGNOSIS — G894 Chronic pain syndrome: Secondary | ICD-10-CM | POA: Diagnosis not present

## 2020-02-25 DIAGNOSIS — M47816 Spondylosis without myelopathy or radiculopathy, lumbar region: Secondary | ICD-10-CM | POA: Diagnosis not present

## 2020-02-25 DIAGNOSIS — M79606 Pain in leg, unspecified: Secondary | ICD-10-CM | POA: Diagnosis not present

## 2020-02-27 IMAGING — DX DG KNEE COMPLETE 4+V*L*
4 series · 4 of 4 positions shown · non-contrast
Comparison: None.

CLINICAL DATA: Left knee pain.

EXAM:
LEFT KNEE - COMPLETE 4+ VIEW

[knee ap]
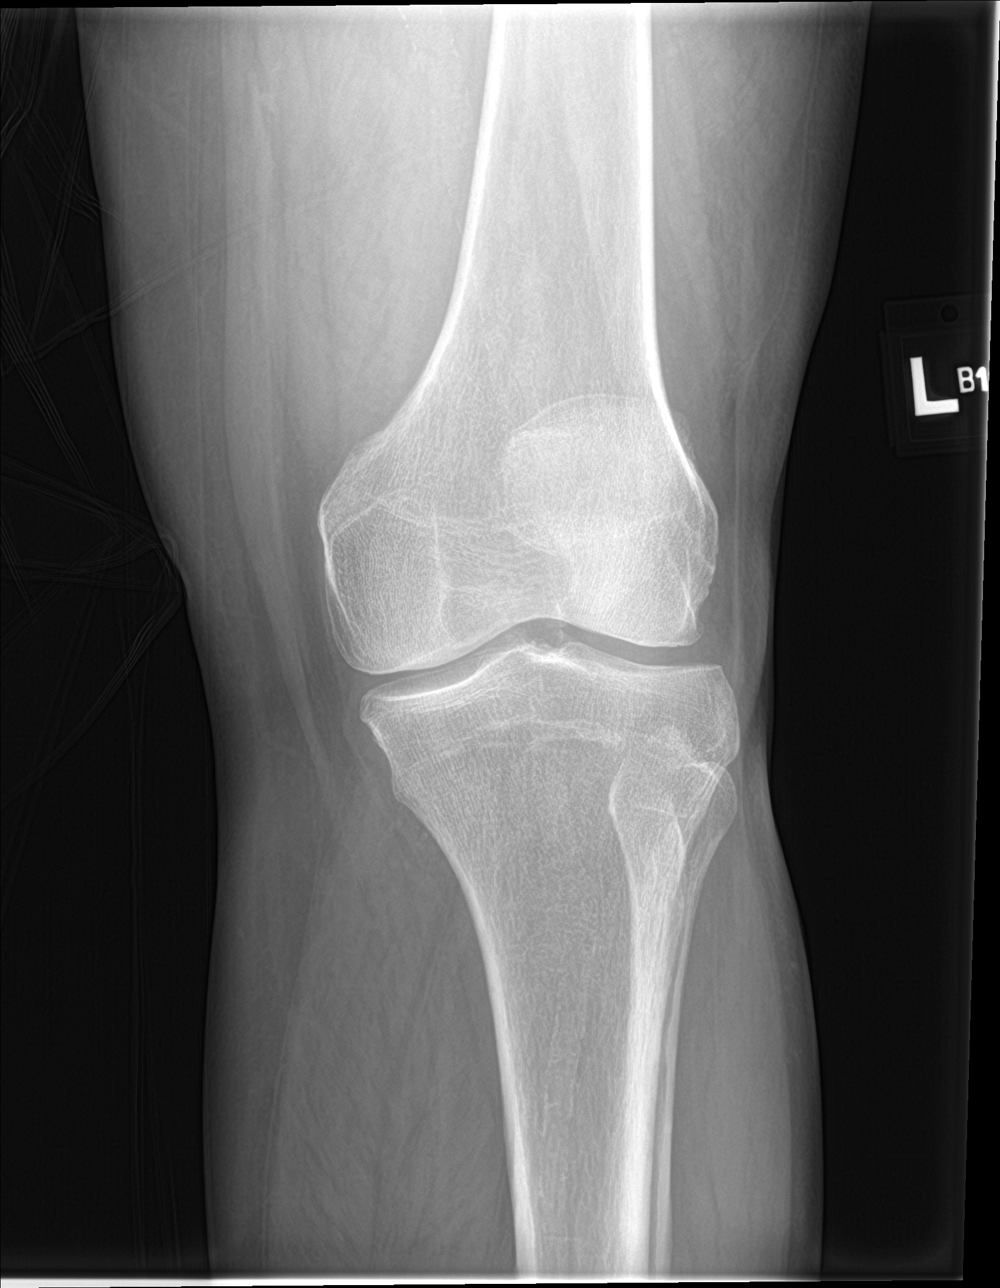

[knee lat]
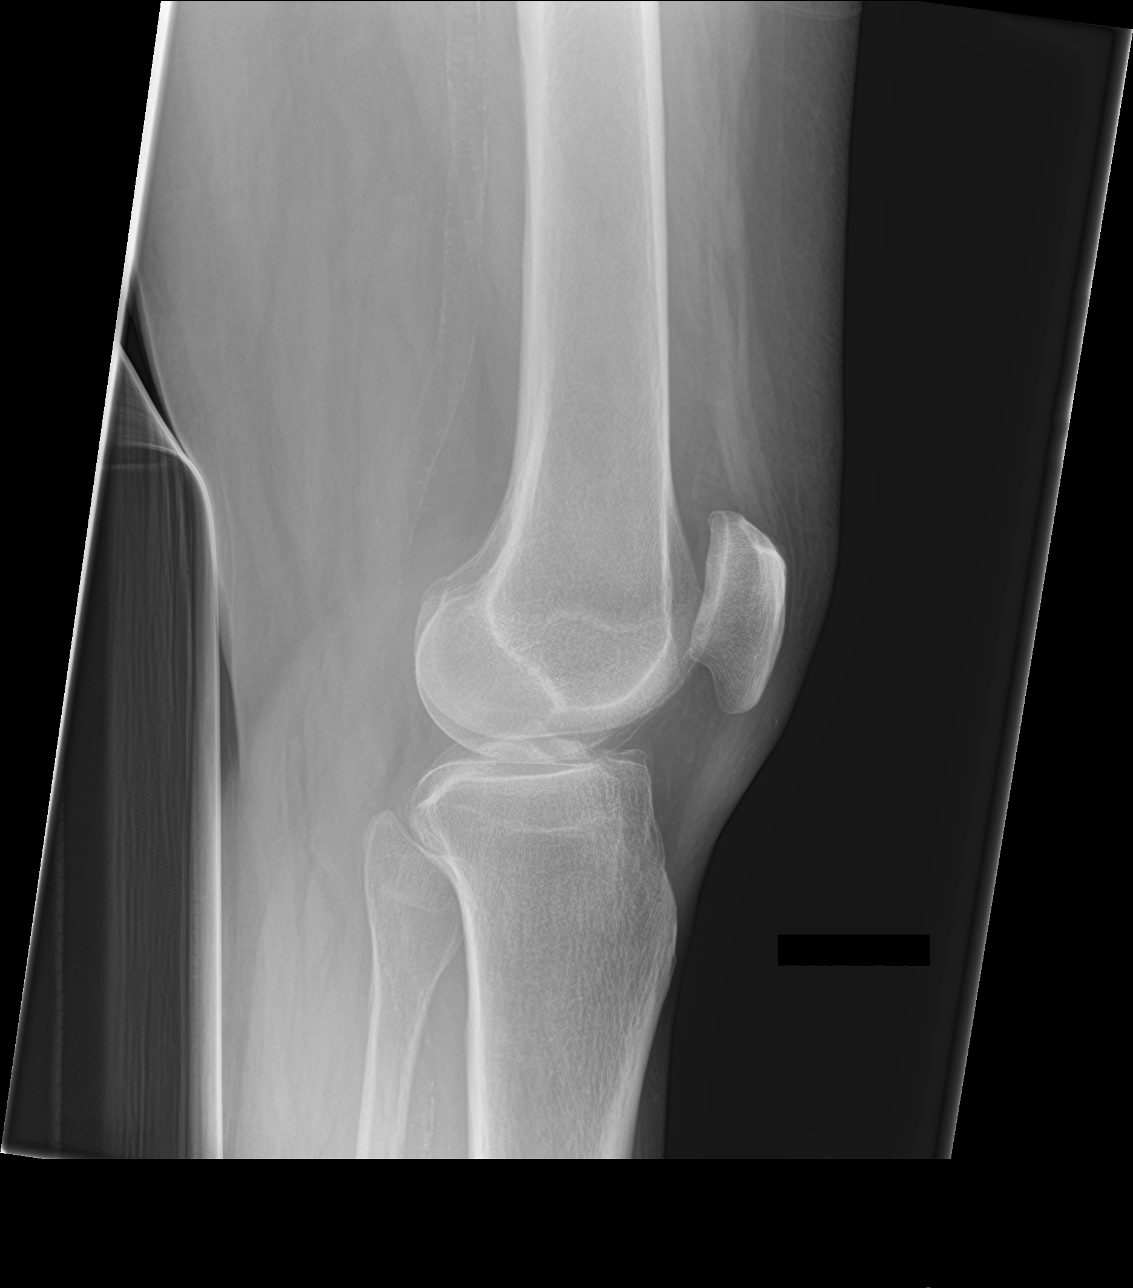

[knee obl (1 of 2)]
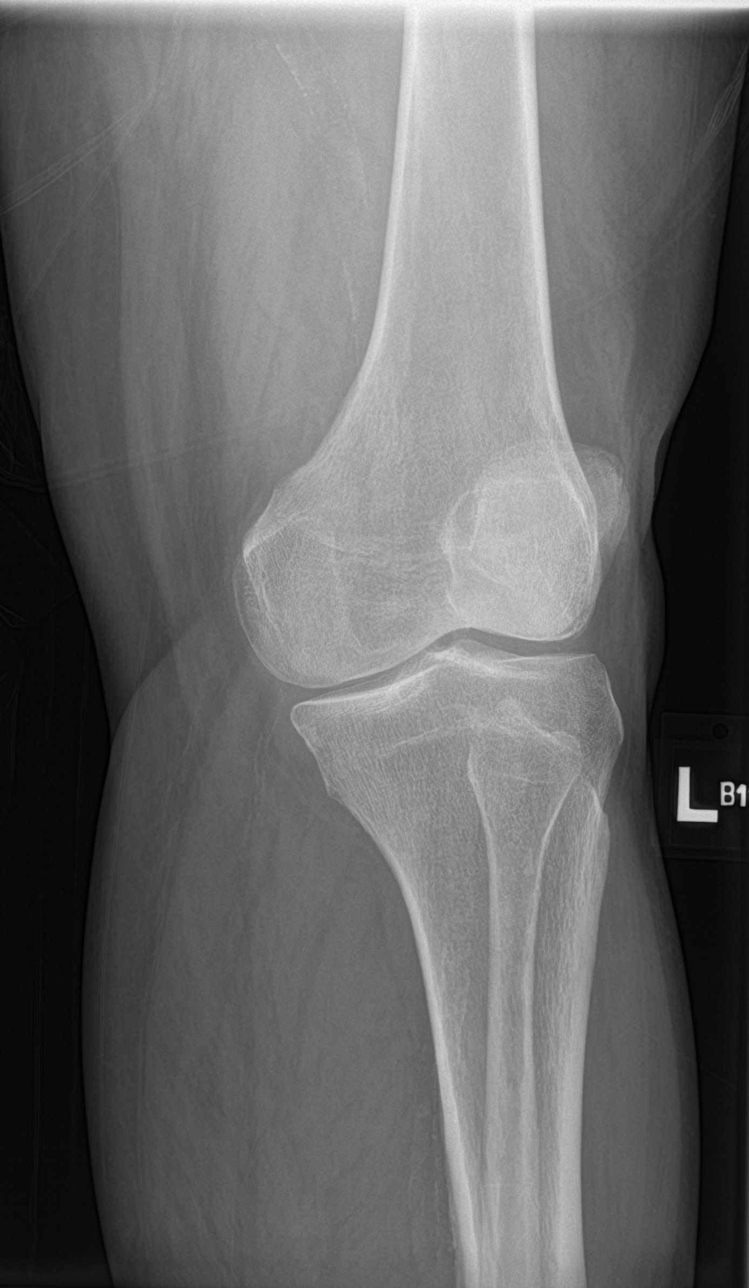

[knee obl (2 of 2)]
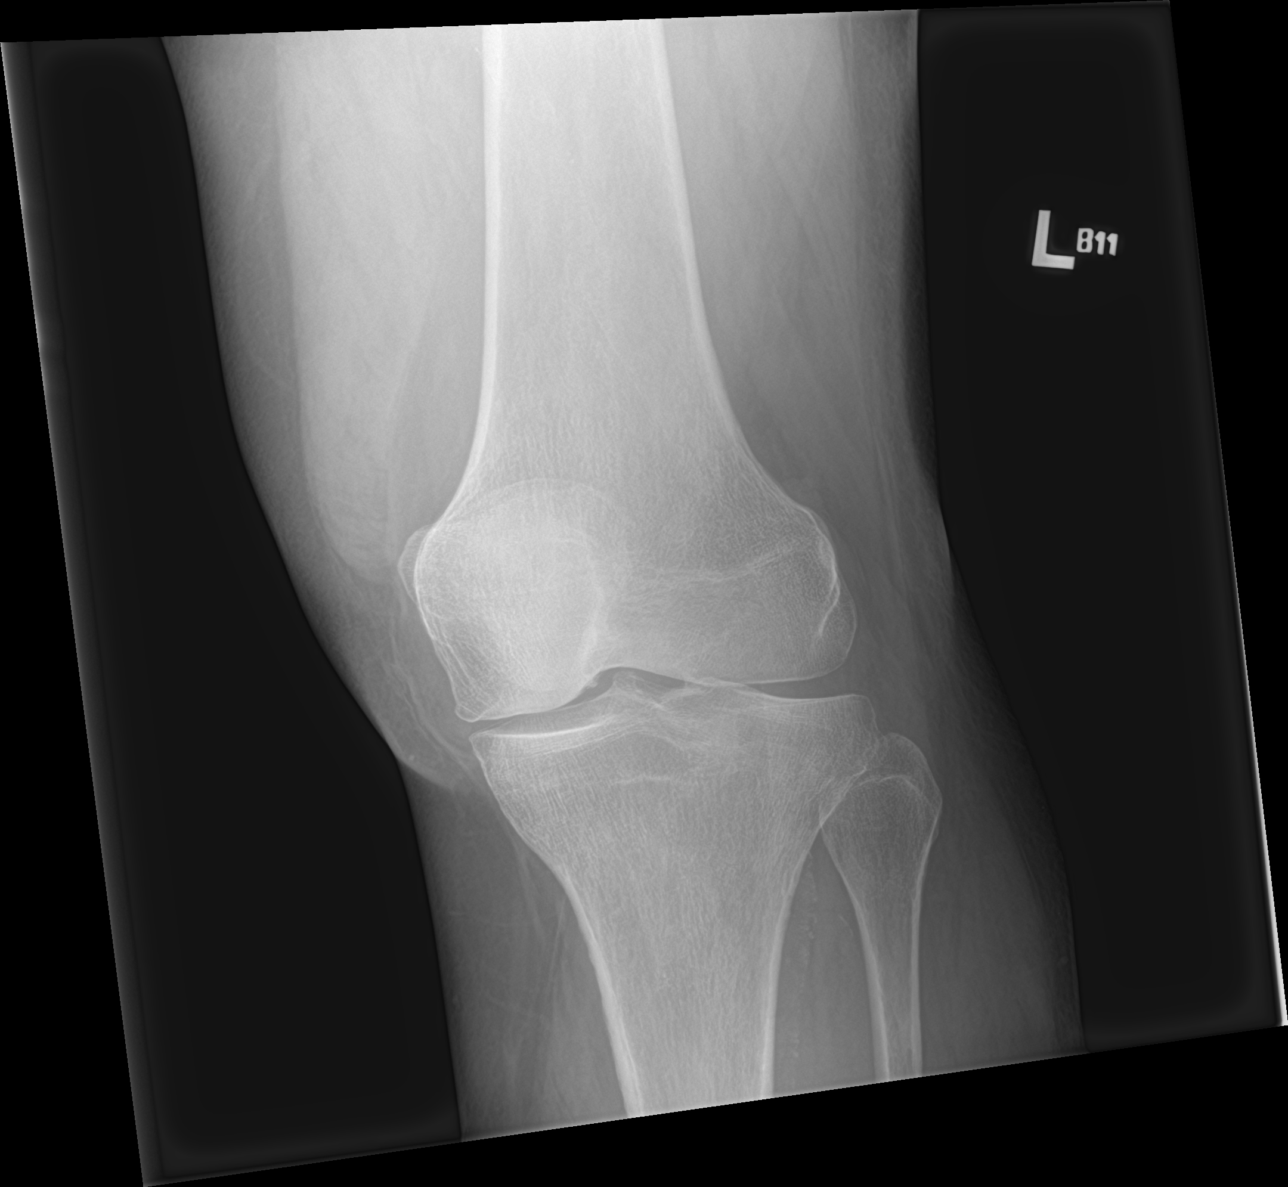

[4 of 4 positions shown; findings below may reference images not displayed]

FINDINGS: Left knee is located without a fracture or large joint effusion.
Mild spurring and degenerative changes in the knee compartments.
IMPRESSION: Mild degenerative changes without acute abnormality to the left
knee.

## 2020-03-12 DIAGNOSIS — F430 Acute stress reaction: Secondary | ICD-10-CM | POA: Diagnosis not present

## 2020-03-12 DIAGNOSIS — Z6836 Body mass index (BMI) 36.0-36.9, adult: Secondary | ICD-10-CM | POA: Diagnosis not present

## 2020-03-26 DIAGNOSIS — G4709 Other insomnia: Secondary | ICD-10-CM | POA: Diagnosis not present

## 2020-03-31 DIAGNOSIS — G2581 Restless legs syndrome: Secondary | ICD-10-CM | POA: Diagnosis not present

## 2020-04-01 ENCOUNTER — Ambulatory Visit (INDEPENDENT_AMBULATORY_CARE_PROVIDER_SITE_OTHER): Payer: Medicare Other | Admitting: Psychologist

## 2020-04-01 DIAGNOSIS — F411 Generalized anxiety disorder: Secondary | ICD-10-CM

## 2020-04-01 DIAGNOSIS — F410 Panic disorder [episodic paroxysmal anxiety] without agoraphobia: Secondary | ICD-10-CM

## 2020-04-22 ENCOUNTER — Ambulatory Visit: Payer: Federal, State, Local not specified - PPO | Admitting: Psychologist

## 2020-05-01 ENCOUNTER — Encounter: Payer: Self-pay | Admitting: Podiatry

## 2020-05-01 ENCOUNTER — Ambulatory Visit (INDEPENDENT_AMBULATORY_CARE_PROVIDER_SITE_OTHER): Payer: Medicare Other | Admitting: Podiatry

## 2020-05-01 ENCOUNTER — Other Ambulatory Visit: Payer: Self-pay

## 2020-05-01 DIAGNOSIS — L603 Nail dystrophy: Secondary | ICD-10-CM

## 2020-05-01 DIAGNOSIS — G609 Hereditary and idiopathic neuropathy, unspecified: Secondary | ICD-10-CM | POA: Diagnosis not present

## 2020-05-01 NOTE — Progress Notes (Signed)
  Subjective:  Patient ID: Franklin Fisher, male    DOB: 1941-09-27,  MRN: [id number]  Chief Complaint  Patient presents with  . Peripheral Neuropathy    Pt states "I have had neuropathy for 10 years, I have lack of sensation in the front of my feet including my toes"   . Nail Problem    Pt states "I need my nails trimmed"     79 y.o. male presents with the above complaint. History confirmed with patient.  He has neuropathy, he does not know the source of this.  He is seeing the neurologist, they think it may be from his spine.  Objective:  Physical Exam: warm, good capillary refill, no trophic changes or ulcerative lesions, normal DP and PT pulses and reduced sensation to [employer] monofilament plantar feet. Left Foot: Elongated toenails x5 Right Foot: Elongated toenails x5   Assessment:   1. Peripheral neuropathy, idiopathic   2. Nail dystrophy      Plan:  Patient was evaluated and treated and all questions answered.  -Nails debrided secondary to his dense neuropathy -Follow-up for routine nail care in 9 weeks  Procedure: Nail Debridement Rationale: Patient meets criteria for routine foot care due to severe neuropathy Type of Debridement: manual, sharp debridement. Instrumentation: Nail nipper, rotary burr. Number of Nails: 10    Return in about 9 weeks (around 07/03/2020) for nail care.

## 2020-05-04 DIAGNOSIS — F4321 Adjustment disorder with depressed mood: Secondary | ICD-10-CM | POA: Diagnosis not present

## 2020-05-05 DIAGNOSIS — M47816 Spondylosis without myelopathy or radiculopathy, lumbar region: Secondary | ICD-10-CM | POA: Diagnosis not present

## 2020-05-05 DIAGNOSIS — M169 Osteoarthritis of hip, unspecified: Secondary | ICD-10-CM | POA: Diagnosis not present

## 2020-05-05 DIAGNOSIS — G894 Chronic pain syndrome: Secondary | ICD-10-CM | POA: Diagnosis not present

## 2020-05-05 DIAGNOSIS — M79606 Pain in leg, unspecified: Secondary | ICD-10-CM | POA: Diagnosis not present

## 2020-05-07 DIAGNOSIS — I10 Essential (primary) hypertension: Secondary | ICD-10-CM | POA: Diagnosis not present

## 2020-05-11 DIAGNOSIS — F4321 Adjustment disorder with depressed mood: Secondary | ICD-10-CM | POA: Diagnosis not present

## 2020-05-11 DIAGNOSIS — L218 Other seborrheic dermatitis: Secondary | ICD-10-CM | POA: Diagnosis not present

## 2020-05-15 DIAGNOSIS — H5213 Myopia, bilateral: Secondary | ICD-10-CM | POA: Diagnosis not present

## 2020-05-15 DIAGNOSIS — Z961 Presence of intraocular lens: Secondary | ICD-10-CM | POA: Diagnosis not present

## 2020-05-23 ENCOUNTER — Emergency Department (HOSPITAL_COMMUNITY)
Admission: EM | Admit: 2020-05-23 | Discharge: 2020-05-24 | Disposition: A | Discharge status: Home / Self Care | Payer: Medicare Other | Attending: Emergency Medicine | Admitting: Emergency Medicine

## 2020-05-23 ENCOUNTER — Emergency Department (HOSPITAL_COMMUNITY): Payer: Medicare Other

## 2020-05-23 ENCOUNTER — Encounter (HOSPITAL_COMMUNITY): Payer: Self-pay

## 2020-05-23 DIAGNOSIS — R509 Fever, unspecified: Secondary | ICD-10-CM | POA: Diagnosis not present

## 2020-05-23 DIAGNOSIS — J439 Emphysema, unspecified: Secondary | ICD-10-CM | POA: Diagnosis not present

## 2020-05-23 DIAGNOSIS — Z20822 Contact with and (suspected) exposure to covid-19: Secondary | ICD-10-CM | POA: Insufficient documentation

## 2020-05-23 DIAGNOSIS — R079 Chest pain, unspecified: Secondary | ICD-10-CM | POA: Insufficient documentation

## 2020-05-23 DIAGNOSIS — Z5321 Procedure and treatment not carried out due to patient leaving prior to being seen by health care provider: Secondary | ICD-10-CM | POA: Insufficient documentation

## 2020-05-23 DIAGNOSIS — R5383 Other fatigue: Secondary | ICD-10-CM | POA: Insufficient documentation

## 2020-05-23 LAB — CBC
HCT: 48.2 % (ref 39.0–52.0)
Hemoglobin: 16.4 g/dL (ref 13.0–17.0)
MCH: 30.8 pg (ref 26.0–34.0)
MCHC: 34 g/dL (ref 30.0–36.0)
MCV: 90.6 fL (ref 80.0–100.0)
Platelets: 142 10*3/uL — ABNORMAL LOW (ref 150–400)
RBC: 5.32 MIL/uL (ref 4.22–5.81)
RDW: 12.9 % (ref 11.5–15.5)
WBC: 8.2 10*3/uL (ref 4.0–10.5)
nRBC: 0 % (ref 0.0–0.2)

## 2020-05-23 LAB — BASIC METABOLIC PANEL
Anion gap: 10 (ref 5–15)
BUN: 14 mg/dL (ref 8–23)
CO2: 23 mmol/L (ref 22–32)
Calcium: 9.1 mg/dL (ref 8.9–10.3)
Chloride: 104 mmol/L (ref 98–111)
Creatinine, Ser: 1.04 mg/dL (ref 0.61–1.24)
GFR calc Af Amer: >60 mL/min (ref >60)
GFR calc non Af Amer: >60 mL/min (ref >60)
Glucose, Bld: 135 mg/dL — ABNORMAL HIGH (ref 70–99)
Potassium: 3.8 mmol/L (ref 3.5–5.1)
Sodium: 137 mmol/L (ref 135–145)

## 2020-05-23 LAB — TROPONIN I (HIGH SENSITIVITY)
Troponin I (High Sensitivity): 3 ng/L (ref <18)
Troponin I (High Sensitivity): 5 ng/L (ref <18)

## 2020-05-23 LAB — POC SARS CORONAVIRUS 2 AG -  ED: SARS Coronavirus 2 Ag: NEGATIVE

## 2020-05-23 MED ORDER — SODIUM CHLORIDE 0.9% FLUSH: 3.0000 mL | Freq: Once | INTRAVENOUS | Status: DC

## 2020-05-23 NOTE — ED Triage Notes (Signed)
Pt states that he woke up last night with fatigue , chills and fever, tonight began to have a CP on the way here.

## 2020-05-24 NOTE — ED Notes (Signed)
Pt stated he did not want to wait and left the building

## 2020-05-25 ENCOUNTER — Other Ambulatory Visit: Payer: Self-pay

## 2020-05-25 ENCOUNTER — Emergency Department (HOSPITAL_COMMUNITY)
Admission: EM | Admit: 2020-05-25 | Discharge: 2020-05-26 | Disposition: A | Discharge status: Home / Self Care | Payer: Medicare Other | Attending: Emergency Medicine | Admitting: Emergency Medicine

## 2020-05-25 ENCOUNTER — Encounter (HOSPITAL_COMMUNITY): Payer: Self-pay | Admitting: *Deleted

## 2020-05-25 ENCOUNTER — Emergency Department (HOSPITAL_COMMUNITY): Payer: Medicare Other

## 2020-05-25 DIAGNOSIS — Z79899 Other long term (current) drug therapy: Secondary | ICD-10-CM | POA: Insufficient documentation

## 2020-05-25 DIAGNOSIS — Z96652 Presence of left artificial knee joint: Secondary | ICD-10-CM | POA: Insufficient documentation

## 2020-05-25 DIAGNOSIS — Z7982 Long term (current) use of aspirin: Secondary | ICD-10-CM | POA: Insufficient documentation

## 2020-05-25 DIAGNOSIS — R6883 Chills (without fever): Secondary | ICD-10-CM | POA: Diagnosis not present

## 2020-05-25 DIAGNOSIS — R079 Chest pain, unspecified: Secondary | ICD-10-CM | POA: Diagnosis not present

## 2020-05-25 DIAGNOSIS — R0789 Other chest pain: Secondary | ICD-10-CM | POA: Insufficient documentation

## 2020-05-25 DIAGNOSIS — R9431 Abnormal electrocardiogram [ECG] [EKG]: Secondary | ICD-10-CM | POA: Insufficient documentation

## 2020-05-25 DIAGNOSIS — R5381 Other malaise: Secondary | ICD-10-CM | POA: Diagnosis not present

## 2020-05-25 DIAGNOSIS — I10 Essential (primary) hypertension: Secondary | ICD-10-CM | POA: Diagnosis not present

## 2020-05-25 LAB — CBC
HCT: 47.6 % (ref 39.0–52.0)
Hemoglobin: 15.8 g/dL (ref 13.0–17.0)
MCH: 30.3 pg (ref 26.0–34.0)
MCHC: 33.2 g/dL (ref 30.0–36.0)
MCV: 91.4 fL (ref 80.0–100.0)
Platelets: 156 10*3/uL (ref 150–400)
RBC: 5.21 MIL/uL (ref 4.22–5.81)
RDW: 12.9 % (ref 11.5–15.5)
WBC: 6.3 10*3/uL (ref 4.0–10.5)
nRBC: 0 % (ref 0.0–0.2)

## 2020-05-25 LAB — BASIC METABOLIC PANEL
Anion gap: 11 (ref 5–15)
BUN: 15 mg/dL (ref 8–23)
CO2: 24 mmol/L (ref 22–32)
Calcium: 9.4 mg/dL (ref 8.9–10.3)
Chloride: 106 mmol/L (ref 98–111)
Creatinine, Ser: 1.05 mg/dL (ref 0.61–1.24)
GFR calc Af Amer: >60 mL/min (ref >60)
GFR calc non Af Amer: >60 mL/min (ref >60)
Glucose, Bld: 141 mg/dL — ABNORMAL HIGH (ref 70–99)
Potassium: 3.8 mmol/L (ref 3.5–5.1)
Sodium: 141 mmol/L (ref 135–145)

## 2020-05-25 LAB — TROPONIN I (HIGH SENSITIVITY)
Troponin I (High Sensitivity): <2 ng/L (ref <18)
Troponin I (High Sensitivity): 4 ng/L (ref <18)

## 2020-05-25 MED ORDER — SODIUM CHLORIDE 0.9% FLUSH: 3.0000 mL | Freq: Once | INTRAVENOUS | Status: DC

## 2020-05-25 NOTE — ED Triage Notes (Signed)
Pt states he left here Saturday after several tests and LWBS. Pt called his providers after he saw his EKG in his chart. Pt states that he is "tense" in his chest.

## 2020-05-26 NOTE — ED Provider Notes (Signed)
[name]   CSN: [id number] Arrival date & time: 05/25/20  1759     History Chief Complaint  Patient presents with  . Chest Pain    Franklin Fisher is a 79 y.o. male.  Patient with history of hypertension presents the emergency department for chills and an abnormal EKG.  Patient states that he presented to the emergency department 3 nights ago after feeling unwell and having chills.  He waited in the waiting room for an extended period of time and decided to leave.  Prior to discharge, he had reassuring lab work, and a negative Covid test, and normal x-ray.  After returning home, patient reviewed his EKG on line.  It was read as abnormal.  Patient was concerned about this we called his primary care doctor.  He was encouraged to go to a walk-in clinic but then was later called and told that he should come to the emergency department.  Currently he has no symptoms.  He denies chest pain or tightness.  He denies fever or chills.  Symptoms from the other night have completely resolved.  He denies nausea or vomiting, diaphoresis, exertional symptoms.  Patient is clear that he is mainly here because he is interested in the interpretation of his EKG.        Past Medical History:  Diagnosis Date  . BPH (benign prostatic hyperplasia)   . Headache    stopped at age 75  . History of kidney stones    passes 1-2 per year  . Hypertension 2000   Dr. Orland Mustard  . Sleep apnea   . Stroke [facility])    TIA cat scans and MRI were WNL  . TIA (transient ischemic attack)     Patient Active Problem List   Diagnosis Date Noted  . History of total hip arthroplasty, left 04/01/2019  . Osteoarthritis of right hip 03/29/2019  . Preop cardiovascular exam 03/28/2019  . Abnormal electrocardiogram (ECG) (EKG) 03/28/2019  . Essential hypertension 03/28/2019  . Deviated septum 09/11/2018  . Nasal turbinate hypertrophy 09/11/2018  . Sensorineural hearing loss  (SNHL) of both ears 09/11/2018  . History of nephrolithiasis 04/28/2016  . Acne necrotica 03/11/2016  . Intertrigo 03/11/2016  . Non morbid obesity due to excess calories 12/15/2015  . Obstructive sleep apnea (adult) (pediatric) 07/02/2014  . Umbilical hernia 73/41/9379  . Diplopia 06/14/2011  . Gallstone pancreatitis 04/13/2011  . 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 Disturbances of vision, late effect of cerebrovascular disease 09/03/2007  . Asthma 06/04/2007  . Benign prostatic hyperplasia 06/04/2007    Past Surgical History:  Procedure Laterality Date  . CATARACT EXTRACTION, BILATERAL Bilateral 2015  . CHOLECYSTECTOMY    . HERNIA REPAIR     bi lat inguinal  . HERNIA REPAIR    . TONSILLECTOMY     age 55  . TOTAL HIP ARTHROPLASTY Left 04/01/2019   Procedure: LEFT TOTAL HIP ARTHROPLASTY ANTERIOR APPROACH;  Surgeon: Frederik Pear, MD;  Location: [facility];  Service: Orthopedics;  Laterality: Left;       Family History  Problem Relation Age of Onset  . Breast cancer Sister   . Atrial fibrillation Mother   . Congestive Heart Failure Mother   . CAD Father     Social History   Tobacco Use  . Smoking status: Never Smoker  . Smokeless tobacco: Never Used  Vaping Use  . Vaping Use: Never used  Substance Use Topics  . Alcohol use: Not Currently  .  Drug use: Never    Home Medications Prior to Admission medications   Medication Sig Start Date End Date Taking? Authorizing Provider  acyclovir (ZOVIRAX) 200 MG capsule  10/17/19   [provider]  albuterol (PROVENTIL HFA;VENTOLIN HFA) 108 (90 Base) MCG/ACT inhaler Inhale 2 puffs into the lungs every 6 (six) hours as needed for wheezing or shortness of breath.    [provider]  aspirin EC 81 MG tablet Take 1 tablet (81 mg total) by mouth 2 (two) times daily. 04/01/19   Leighton Parody, PA-C  atenolol (TENORMIN) 100 MG tablet Take 100 mg by mouth daily. 04/30/18   [provider]  B-D UF  III MINI PEN NEEDLES 31G X 5 MM MISC SMARTSIG:1 Each SUB-Q Daily 01/21/20   [provider]  betamethasone dipropionate (DIPROLENE) 0.05 % ointment  01/20/20   [provider]  clotrimazole (LOTRIMIN) 1 % cream Apply to affected area twice daily until resolved 01/18/20   [provider]  clotrimazole-betamethasone (LOTRISONE) cream Apply 1 application topically daily as needed for itching. 05/08/18   [provider]  fluconazole (DIFLUCAN) 100 MG tablet Take 100 mg by mouth daily. 01/16/20   [provider]  hydroxypropyl methylcellulose / hypromellose (ISOPTO TEARS / GONIOVISC) 2.5 % ophthalmic solution Place 2 drops into the left eye 3 (three) times daily as needed. 11/08/19   [provider]  ketoconazole (NIZORAL) 2 % shampoo Apply 1 application topically daily as needed for irritation.  03/29/18   [provider]  oxyCODONE-acetaminophen (PERCOCET) 10-325 MG tablet Take 1 tablet by mouth every 6 (six) hours as needed for pain. 04/01/19   Joanell Rising K, PA-C  OZEMPIC, 0.25 OR 0.5 MG/DOSE, 2 MG/1.5ML SOPN Inject into the skin. 04/27/20   [provider]  tamsulosin (FLOMAX) 0.4 MG CAPS capsule Take 0.8 mg by mouth daily.  05/01/18   [provider]  tiZANidine (ZANAFLEX) 2 MG tablet Take 1 tablet (2 mg total) by mouth every 6 (six) hours as needed. 04/01/19   Leighton Parody, PA-C  traZODone (DESYREL) 50 MG tablet  03/26/20   [provider]  triamcinolone lotion (KENALOG) 0.1 %  12/02/19   [provider]  VICTOZA 18 MG/3ML SOPN Inject 1.8 mg into the skin daily. 01/24/20   [provider]    Allergies    Ivp dye [iodinated diagnostic agents]  Review of Systems   Review of Systems  Constitutional: Negative for chills (resolved), diaphoresis and fever.  Respiratory: Negative for cough and shortness of breath.   Cardiovascular: Negative for chest pain, palpitations and leg swelling.    Gastrointestinal: Negative for abdominal pain, nausea and vomiting.  Musculoskeletal: Negative for neck pain.  Neurological: Negative for syncope and light-headedness.    Physical Exam Updated Vital Signs BP 136/66 (BP Location: Right Arm)   Pulse 70   Temp 97.9 F (36.6 C) (Oral)   Resp 20   Ht 5\' 5"  (1.651 m)   Wt 88.5 kg   SpO2 97%   BMI 32.45 kg/m   Physical Exam Vitals and nursing note reviewed.  Constitutional:      Appearance: He is well-developed.  HENT:     Head: Normocephalic and atraumatic.  Eyes:     Conjunctiva/sclera: Conjunctivae normal.  Cardiovascular:     Rate and Rhythm: Normal rate.     Heart sounds: Normal heart sounds.  Pulmonary:     Effort: No respiratory distress.     Breath sounds: No decreased breath  sounds.  Musculoskeletal:     Cervical back: Normal range of motion and neck supple.  Skin:    General: Skin is warm and dry.  Neurological:     Mental Status: He is alert.     ED Results / Procedures / Treatments   Labs (all labs ordered are listed, but only abnormal results are displayed) Labs Reviewed  BASIC METABOLIC PANEL - Abnormal; Notable for the following components:      Result Value   Glucose, Bld 141 (*)    All other components within normal limits  CBC  TROPONIN I (HIGH SENSITIVITY)  TROPONIN I (HIGH SENSITIVITY)    EKG EKG Interpretation  Date/Time:  Monday May 25 2020 18:19:36 EDT Ventricular Rate:  77 PR Interval:  206 QRS Duration: 94 QT Interval:  418 QTC Calculation: 473 R Axis:   -67 Text Interpretation: Normal sinus rhythm Incomplete right bundle branch block Left anterior fascicular block Cannot rule out Inferior infarct (masked by fascicular block?) , age undetermined Possible Anterior infarct , age undetermined Abnormal ECG When compared with ECG of 05/23/2020, No significant change was found Confirmed by Delora Fuel ([id number]) on 05/25/2020 11:26:33 PM Also confirmed by Delora Fuel ([id number]), editor  Hattie Perch [phone])  on 05/26/2020 11:14:26 AM   Radiology DG Chest 2 View  Result Date: 05/25/2020 CLINICAL DATA:  Chest pain EXAM: CHEST - 2 VIEW COMPARISON:  05/23/2020 FINDINGS: Lingular scarring or atelectasis. Right lung clear. Heart is normal size. No effusions. No acute bony abnormality. IMPRESSION: Lingular scarring or atelectasis.  No acute cardiopulmonary disease. Electronically Signed   By: Rolm Baptise M.D.   On: 05/25/2020 18:46    Procedures Procedures (including critical care time)  Medications Ordered in ED Medications  sodium chloride flush (NS) 0.9 % injection 3 mL (has no administration in time range)    ED Course  I have reviewed the triage vital signs and the nursing notes.  Pertinent labs & imaging results that were available during my care of the patient were reviewed by me and considered in my medical decision making (see chart for details).  Patient seen and examined.  I spent time reviewing the patient's recent work-up and EKG.  Questions were answered.  Patient reiterates that he is completely asymptomatic.  He has no concerning features which would warrant further work-up at this time.  We will plan to discharged home and have him follow-up with his primary care doctor.  Vital signs reviewed and are as follows: BP 136/66 (BP Location: Right Arm)   Pulse 70   Temp 97.9 F (36.6 C) (Oral)   Resp 20   Ht 5\' 5"  (1.651 m)   Wt 88.5 kg   SpO2 97%   BMI 32.45 kg/m   Patient urged to return with worsening symptoms or other concerns. Patient verbalized understanding and agrees with plan.      MDM Rules/Calculators/A&P                          Patient with chills and malaise several nights ago, now resolved.  He is here today mainly because of an abnormal EKG and being unable to see his primary care.  Chest pain work-up performed last night including EKG and 2 cardiac enzymes were negative.  Chest x-ray remains clear.  Vital signs are reassuring.   Patient looks great.  Plan for discharge home.  Final Clinical Impression(s) / ED Diagnoses Final diagnoses:  Abnormal EKG  Rx / DC Orders ED Discharge Orders    None       Carlisle Cater, Hershal Coria 05/26/20 1711    Lucrezia Starch, MD 05/28/20 [phone]

## 2020-05-26 NOTE — Discharge Instructions (Addendum)
The work-up that you had last night as well as EKG and cardiac enzymes did not show any signs of heart problems or heart attack.  Your chest x-ray remains clear without signs of infection.  Please return with any persistent chest pain, shortness of breath, fevers or other concerns.

## 2020-06-02 DIAGNOSIS — L814 Other melanin hyperpigmentation: Secondary | ICD-10-CM | POA: Diagnosis not present

## 2020-06-02 DIAGNOSIS — D485 Neoplasm of uncertain behavior of skin: Secondary | ICD-10-CM | POA: Diagnosis not present

## 2020-06-02 DIAGNOSIS — L570 Actinic keratosis: Secondary | ICD-10-CM | POA: Diagnosis not present

## 2020-06-09 ENCOUNTER — Encounter: Payer: Self-pay | Admitting: Neurology

## 2020-06-09 DIAGNOSIS — M169 Osteoarthritis of hip, unspecified: Secondary | ICD-10-CM | POA: Diagnosis not present

## 2020-06-09 DIAGNOSIS — M47816 Spondylosis without myelopathy or radiculopathy, lumbar region: Secondary | ICD-10-CM | POA: Diagnosis not present

## 2020-06-09 DIAGNOSIS — M79606 Pain in leg, unspecified: Secondary | ICD-10-CM | POA: Diagnosis not present

## 2020-06-09 DIAGNOSIS — G894 Chronic pain syndrome: Secondary | ICD-10-CM | POA: Diagnosis not present

## 2020-06-23 DIAGNOSIS — L570 Actinic keratosis: Secondary | ICD-10-CM | POA: Diagnosis not present

## 2020-06-23 DIAGNOSIS — F4321 Adjustment disorder with depressed mood: Secondary | ICD-10-CM | POA: Diagnosis not present

## 2020-06-23 DIAGNOSIS — L309 Dermatitis, unspecified: Secondary | ICD-10-CM | POA: Diagnosis not present

## 2020-06-25 DIAGNOSIS — R109 Unspecified abdominal pain: Secondary | ICD-10-CM | POA: Diagnosis not present

## 2020-06-25 DIAGNOSIS — R5381 Other malaise: Secondary | ICD-10-CM | POA: Diagnosis not present

## 2020-06-25 DIAGNOSIS — Z23 Encounter for immunization: Secondary | ICD-10-CM | POA: Diagnosis not present

## 2020-07-02 DIAGNOSIS — G4733 Obstructive sleep apnea (adult) (pediatric): Secondary | ICD-10-CM | POA: Diagnosis not present

## 2020-07-09 DIAGNOSIS — M47816 Spondylosis without myelopathy or radiculopathy, lumbar region: Secondary | ICD-10-CM | POA: Diagnosis not present

## 2020-07-09 DIAGNOSIS — Z79899 Other long term (current) drug therapy: Secondary | ICD-10-CM | POA: Diagnosis not present

## 2020-07-09 DIAGNOSIS — M169 Osteoarthritis of hip, unspecified: Secondary | ICD-10-CM | POA: Diagnosis not present

## 2020-07-09 DIAGNOSIS — G894 Chronic pain syndrome: Secondary | ICD-10-CM | POA: Diagnosis not present

## 2020-07-09 DIAGNOSIS — M79606 Pain in leg, unspecified: Secondary | ICD-10-CM | POA: Diagnosis not present

## 2020-07-09 DIAGNOSIS — Z79891 Long term (current) use of opiate analgesic: Secondary | ICD-10-CM | POA: Diagnosis not present

## 2020-07-10 ENCOUNTER — Ambulatory Visit (INDEPENDENT_AMBULATORY_CARE_PROVIDER_SITE_OTHER): Payer: Medicare Other | Admitting: Podiatry

## 2020-07-10 ENCOUNTER — Encounter: Payer: Self-pay | Admitting: Podiatry

## 2020-07-10 ENCOUNTER — Other Ambulatory Visit: Payer: Self-pay

## 2020-07-10 DIAGNOSIS — M79674 Pain in right toe(s): Secondary | ICD-10-CM

## 2020-07-10 DIAGNOSIS — M79675 Pain in left toe(s): Secondary | ICD-10-CM

## 2020-07-10 DIAGNOSIS — B351 Tinea unguium: Secondary | ICD-10-CM | POA: Diagnosis not present

## 2020-07-14 DIAGNOSIS — F4321 Adjustment disorder with depressed mood: Secondary | ICD-10-CM | POA: Diagnosis not present

## 2020-07-14 NOTE — Progress Notes (Signed)
Subjective:  Patient ID: Franklin Fisher, male    DOB: Apr 08, 1941,  MRN: [id number]  79 y.o. male presents with at risk foot care with history of peripheral neuropathy and painful thick toenails that are difficult to trim. Pain interferes with ambulation. Aggravating factors include wearing enclosed shoe gear. Pain is relieved with periodic professional debridement.   He states his neuropathy is getting worse and he has an appointment with Neurology on September 07, 2020.    Review of Systems: Negative except as noted in the HPI.  Past Medical History:  Diagnosis Date  . BPH (benign prostatic hyperplasia)   . Headache    stopped at age 21  . History of kidney stones    passes 1-2 per year  . Hypertension 2000   Dr. Orland Mustard  . Sleep apnea   . Stroke [facility])    TIA cat scans and MRI were WNL  . TIA (transient ischemic attack)    Past Surgical History:  Procedure Laterality Date  . CATARACT EXTRACTION, BILATERAL Bilateral 2015  . CHOLECYSTECTOMY    . HERNIA REPAIR     bi lat inguinal  . HERNIA REPAIR    . TONSILLECTOMY     age 53  . TOTAL HIP ARTHROPLASTY Left 04/01/2019   Procedure: LEFT TOTAL HIP ARTHROPLASTY ANTERIOR APPROACH;  Surgeon: Frederik Pear, MD;  Location: [facility];  Service: Orthopedics;  Laterality: Left;   Patient Active Problem List   Diagnosis Date Noted  . History of total hip arthroplasty, left 04/01/2019  . Osteoarthritis of right hip 03/29/2019  . Preop cardiovascular exam 03/28/2019  . Abnormal electrocardiogram (ECG) (EKG) 03/28/2019  . Essential hypertension 03/28/2019  . Deviated septum 09/11/2018  . Nasal turbinate hypertrophy 09/11/2018  . 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/11/2018  . History of nephrolithiasis 04/28/2016  . Acne necrotica 03/11/2016  . Intertrigo 03/11/2016  . Non morbid obesity due to excess calories 12/15/2015  . Obstructive sleep apnea (adult) (pediatric) 07/02/2014  . Umbilical hernia 80/99/8338  . Diplopia  06/14/2011  . Gallstone pancreatitis 04/13/2011  . 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 Disturbances of vision, late effect of cerebrovascular disease 09/03/2007  . Asthma 06/04/2007  . Benign prostatic hyperplasia 06/04/2007    Current Outpatient Medications:  .  acyclovir (ZOVIRAX) 200 MG capsule, , Disp: , Rfl:  .  albuterol (PROVENTIL HFA;VENTOLIN HFA) 108 (90 Base) MCG/ACT inhaler, Inhale 2 puffs into the lungs every 6 (six) hours as needed for wheezing or shortness of breath., Disp: , Rfl:  .  aspirin EC 81 MG tablet, Take 1 tablet (81 mg total) by mouth 2 (two) times daily., Disp: 60 tablet, Rfl: 0 .  atenolol (TENORMIN) 100 MG tablet, Take 100 mg by mouth daily., Disp: , Rfl:  .  B-D UF III MINI PEN NEEDLES 31G X 5 MM MISC, SMARTSIG:1 Each SUB-Q Daily, Disp: , Rfl:  .  betamethasone dipropionate (DIPROLENE) 0.05 % ointment, , Disp: , Rfl:  .  clotrimazole (LOTRIMIN) 1 % cream, Apply to affected area twice daily until resolved, Disp: , Rfl:  .  clotrimazole-betamethasone (LOTRISONE) cream, Apply 1 application topically daily as needed for itching., Disp: , Rfl:  .  fluconazole (DIFLUCAN) 100 MG tablet, Take 100 mg by mouth daily., Disp: , Rfl:  .  hydroxypropyl methylcellulose / hypromellose (ISOPTO TEARS / GONIOVISC) 2.5 % ophthalmic solution, Place 2 drops into the left eye 3 (three) times daily as needed., Disp: , Rfl:  .  ketoconazole (NIZORAL) 2 %  shampoo, Apply 1 application topically daily as needed for irritation. , Disp: , Rfl:  .  oxyCODONE-acetaminophen (PERCOCET) 7.5-325 MG tablet, Take 1 tablet by mouth 3 (three) times daily as needed., Disp: , Rfl:  .  tamsulosin (FLOMAX) 0.4 MG CAPS capsule, Take 0.8 mg by mouth daily. , Disp: , Rfl:  .  tiZANidine (ZANAFLEX) 2 MG tablet, Take 1 tablet (2 mg total) by mouth every 6 (six) hours as needed., Disp: 60 tablet, Rfl: 0 .  traZODone (DESYREL) 50 MG tablet, , Disp: , Rfl:  .  triamcinolone lotion (KENALOG) 0.1 %, ,  Disp: , Rfl:  .  VICTOZA 18 MG/3ML SOPN, Inject 1.8 mg into the skin daily., Disp: , Rfl:   Current Facility-Administered Medications:  .  diclofenac sodium (VOLTAREN) 1 % transdermal gel 2 g, 2 g, Topical, QID, Pete Pelt, PA-C Allergies  Allergen Reactions  . Ivp Dye [Iodinated Diagnostic Agents] Hives   Social History   Occupational History  . Not on file  Tobacco Use  . Smoking status: Never Smoker  . Smokeless tobacco: Never Used  Vaping Use  . Vaping Use: Never used  Substance and Sexual Activity  . Alcohol use: Not Currently  . Drug use: Never  . Sexual activity: Not on file    Objective:   Constitutional Pt is a pleasant 79 y.o. Caucasian male in NAD. AAO x 3.   Vascular Capillary refill time to digits immediate b/l. Palpable pedal pulses b/l LE. Pedal hair sparse. Lower extremity skin temperature gradient within normal limits. No pain with calf compression b/l. No cyanosis or clubbing noted.  Neurologic Normal speech. Oriented to person, place, and time. Protective sensation diminished with 10g monofilament b/l.  Dermatologic Pedal skin with normal turgor, texture and tone bilaterally. No open wounds bilaterally. No interdigital macerations bilaterally. Toenails 1-5 b/l elongated, discolored, dystrophic, thickened, crumbly with subungual debris and tenderness to dorsal palpation.  Orthopedic: Normal muscle strength 5/5 to all lower extremity muscle groups bilaterally. No pain crepitus or joint limitation noted with ROM b/l. No gross bony deformities bilaterally.   Radiographs: None Assessment:  No diagnosis found. Plan:  Patient was evaluated and treated and all questions answered.  Onychomycosis with pain -Nails palliatively debridement as below. -Educated on self-care  Procedure: Nail Debridement Rationale: Pain Type of Debridement: manual, sharp debridement. Instrumentation: Nail nipper, rotary burr. Number of Nails: 10  -Examined patient. -No new  findings. No new orders. -Toenails 1-5 b/l were debrided in length and girth with sterile nail nippers and dremel without iatrogenic bleeding.  -Patient to report any pedal injuries to medical professional immediately. -He will be seeing Neurology in November for neuropathic pain. -Patient to continue soft, supportive shoe gear daily. -Patient/POA to call should there be question/concern in the interim.  Return in about 3 months (around 10/09/2020).  Marzetta Board, DPM

## 2020-07-16 DIAGNOSIS — M25551 Pain in right hip: Secondary | ICD-10-CM | POA: Diagnosis not present

## 2020-07-16 DIAGNOSIS — M1611 Unilateral primary osteoarthritis, right hip: Secondary | ICD-10-CM | POA: Diagnosis not present

## 2020-07-29 DIAGNOSIS — K5730 Diverticulosis of large intestine without perforation or abscess without bleeding: Secondary | ICD-10-CM | POA: Diagnosis not present

## 2020-07-29 DIAGNOSIS — N400 Enlarged prostate without lower urinary tract symptoms: Secondary | ICD-10-CM | POA: Diagnosis not present

## 2020-07-29 DIAGNOSIS — N210 Calculus in bladder: Secondary | ICD-10-CM | POA: Diagnosis not present

## 2020-07-29 DIAGNOSIS — N200 Calculus of kidney: Secondary | ICD-10-CM | POA: Diagnosis not present

## 2020-07-29 DIAGNOSIS — N201 Calculus of ureter: Secondary | ICD-10-CM | POA: Diagnosis not present

## 2020-07-31 DIAGNOSIS — R3916 Straining to void: Secondary | ICD-10-CM | POA: Diagnosis not present

## 2020-07-31 DIAGNOSIS — N401 Enlarged prostate with lower urinary tract symptoms: Secondary | ICD-10-CM | POA: Diagnosis not present

## 2020-07-31 DIAGNOSIS — R3911 Hesitancy of micturition: Secondary | ICD-10-CM | POA: Diagnosis not present

## 2020-07-31 DIAGNOSIS — R3912 Poor urinary stream: Secondary | ICD-10-CM | POA: Diagnosis not present

## 2020-08-04 DIAGNOSIS — M47816 Spondylosis without myelopathy or radiculopathy, lumbar region: Secondary | ICD-10-CM | POA: Diagnosis not present

## 2020-08-04 DIAGNOSIS — G894 Chronic pain syndrome: Secondary | ICD-10-CM | POA: Diagnosis not present

## 2020-08-04 DIAGNOSIS — M79606 Pain in leg, unspecified: Secondary | ICD-10-CM | POA: Diagnosis not present

## 2020-08-04 DIAGNOSIS — M169 Osteoarthritis of hip, unspecified: Secondary | ICD-10-CM | POA: Diagnosis not present

## 2020-08-12 DIAGNOSIS — F4321 Adjustment disorder with depressed mood: Secondary | ICD-10-CM | POA: Diagnosis not present

## 2020-08-13 ENCOUNTER — Ambulatory Visit (INDEPENDENT_AMBULATORY_CARE_PROVIDER_SITE_OTHER): Payer: Medicare Other | Admitting: Neurology

## 2020-08-13 ENCOUNTER — Other Ambulatory Visit: Payer: Self-pay

## 2020-08-13 ENCOUNTER — Encounter: Payer: Self-pay | Admitting: Neurology

## 2020-08-13 VITALS — BP 107/67 | HR 60 | Ht 65.0 in | Wt 201.0 lb

## 2020-08-13 DIAGNOSIS — G629 Polyneuropathy, unspecified: Secondary | ICD-10-CM | POA: Diagnosis not present

## 2020-08-13 NOTE — Patient Instructions (Signed)
Take extra caution on uneven ground or when your eyes are closed  Check your feet daily  Return to clinic as needed

## 2020-08-13 NOTE — Progress Notes (Signed)
[facility] Clinic Note - Initial Visit   Date: 08/13/20  Franklin Fisher MRN: [id number] DOB: 1941/03/13   Dear Dr. Orland Mustard:   Thank you for your kind referral of Bravlio Fisher for consultation of painful neuropathy. Although his history is well known to you, please allow [place] to reiterate it for the purpose of our medical record. The patient was accompanied to the clinic by self.    History of Present Illness: Franklin Fisher is a 79 y.o. right-handed male with BPH, hypertension, stroke (no residual deficits), and lumbar spine stenosis presenting for evaluation of neuropathy.   Starting around 10 years ago, he developed numbness/tingling involving the toes and soles of the feet, which is more bothersome at night time.  He was evaluated by neurology when he was living in [place] and had  NCS/EMG which confirmed neuropathy.  Labs were normal. Over the years, his numbness has progressed into the tops of the feet. He has some imbalance, which he attributes to his back. No falls and he walks unassisted.  He occasionally has tingling in the hands, usually when sitting which resolves with repositioning.  No weakness of the hands or feet.   He is not diabetic and he does not have alcohol history.  Mother had neuropathy in her 88s.  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 He moved to [place] to be closer to his girlfriend.   Past Medical History:  Diagnosis Date  . BPH (benign prostatic hyperplasia)   . Headache    stopped at age 65  . History of kidney stones    passes 1-2 per year  . Hypertension 2000   Dr. Orland Mustard  . Sleep apnea   . Stroke [facility])    TIA cat scans and MRI were WNL  . TIA (transient ischemic attack)     Past Surgical History:  Procedure Laterality Date  . CATARACT EXTRACTION, BILATERAL Bilateral 2015  . CHOLECYSTECTOMY    . HERNIA REPAIR     bi lat inguinal  . HERNIA REPAIR    . TONSILLECTOMY     age 4   . TOTAL HIP ARTHROPLASTY Left 04/01/2019   Procedure: LEFT TOTAL HIP ARTHROPLASTY ANTERIOR APPROACH;  Surgeon: Frederik Pear, MD;  Location: [facility];  Service: Orthopedics;  Laterality: Left;     Medications:  Outpatient Encounter Medications as of 08/13/2020  Medication Sig  . atenolol (TENORMIN) 100 MG tablet Take 100 mg by mouth daily.  . hydroxypropyl methylcellulose / hypromellose (ISOPTO TEARS / GONIOVISC) 2.5 % ophthalmic solution Place 2 drops into the left eye 3 (three) times daily as needed.  Marland Kitchen ketoconazole (NIZORAL) 2 % shampoo Apply 1 application topically daily as needed for irritation.   Marland Kitchen oxyCODONE-acetaminophen (PERCOCET) 7.5-325 MG tablet Take 1 tablet by mouth 3 (three) times daily as needed.  . tamsulosin (FLOMAX) 0.4 MG CAPS capsule Take 0.8 mg by mouth daily.   Marland Kitchen triamcinolone lotion (KENALOG) 0.1 %   . acyclovir (ZOVIRAX) 200 MG capsule  (Patient not taking: Reported on 08/13/2020)  . albuterol (PROVENTIL HFA;VENTOLIN HFA) 108 (90 Base) MCG/ACT inhaler Inhale 2 puffs into the lungs every 6 (six) hours as needed for wheezing or shortness of breath. (Patient not taking: Reported on 08/13/2020)  . aspirin EC 81 MG tablet Take 1 tablet (81 mg total) by mouth 2 (two) times daily. (Patient not taking: Reported on 08/13/2020)  . B-D UF III MINI PEN NEEDLES 31G X 5 MM MISC SMARTSIG:1 Each SUB-Q  Daily (Patient not taking: Reported on 08/13/2020)  . betamethasone dipropionate (DIPROLENE) 0.05 % ointment  (Patient not taking: Reported on 08/13/2020)  . clotrimazole (LOTRIMIN) 1 % cream Apply to affected area twice daily until resolved (Patient not taking: Reported on 08/13/2020)  . clotrimazole-betamethasone (LOTRISONE) cream Apply 1 application topically daily as needed for itching. (Patient not taking: Reported on 08/13/2020)  . fluconazole (DIFLUCAN) 100 MG tablet Take 100 mg by mouth daily. (Patient not taking: Reported on 08/13/2020)  . tiZANidine (ZANAFLEX) 2 MG tablet Take 1  tablet (2 mg total) by mouth every 6 (six) hours as needed. (Patient not taking: Reported on 08/13/2020)  . traZODone (DESYREL) 50 MG tablet  (Patient not taking: Reported on 08/13/2020)  . VICTOZA 18 MG/3ML SOPN Inject 1.8 mg into the skin daily. (Patient not taking: Reported on 08/13/2020)   Facility-Administered Encounter Medications as of 08/13/2020  Medication  . diclofenac sodium (VOLTAREN) 1 % transdermal gel 2 g    Allergies:  Allergies  Allergen Reactions  . Ivp Dye [Iodinated Diagnostic Agents] Hives    Family History: Family History  Problem Relation Age of Onset  . Breast cancer Sister   . Atrial fibrillation Mother   . Congestive Heart Failure Mother   . CAD Father     Social History: Social History   Tobacco Use  . Smoking status: Never Smoker  . Smokeless tobacco: Never Used  Vaping Use  . Vaping Use: Never used  Substance Use Topics  . Alcohol use: Not Currently  . Drug use: Never   Social History   Social History Narrative   Right Handed   Lives in a apartment on the 2nd floor, Elevators available   Drinks caffeine 4-5 cups a day    Vital Signs:  BP 107/67   Pulse 60   Ht 5\' 5"  (1.651 m)   Wt 201 lb (91.2 kg)   SpO2 96%   BMI 33.45 kg/m   Neurological Exam: MENTAL STATUS including orientation to time, place, person, recent and remote memory, attention span and concentration, language, and fund of knowledge is normal.  Speech is not dysarthric.  CRANIAL NERVES: II:  No visual field defects.   III-IV-VI: Pupils equal round and reactive.  Normal conjugate, extra-ocular eye movements in all directions of gaze.  No nystagmus.  No ptosis.   V:  Normal facial sensation.    VII:  Normal facial symmetry and movements.   VIII:  Normal hearing and vestibular function.   IX-X:  Normal palatal movement.   XI:  Normal shoulder shrug and head rotation.   XII:  Normal tongue strength and range of motion, no deviation or fasciculation.  MOTOR:  No  atrophy, fasciculations or abnormal movements.  No pronator drift.   Upper Extremity:  Right  Left  Deltoid  5/5   5/5   Biceps  5/5   5/5   Triceps  5/5   5/5   Infraspinatus 5/5  5/5  Medial pectoralis 5/5  5/5  Wrist extensors  5/5   5/5   Wrist flexors  5/5   5/5   Finger extensors  5/5   5/5   Finger flexors  5/5   5/5   Dorsal interossei  5/5   5/5   Abductor pollicis  5/5   5/5   Tone (Ashworth scale)  0  0   Lower Extremity:  Right  Left  Hip flexors  5/5   5/5   Hip extensors  5/5  5/5   Adductor 5/5  5/5  Abductor 5/5  5/5  Knee flexors  5/5   5/5   Knee extensors  5/5   5/5   Dorsiflexors  5/5   5/5   Plantarflexors  5/5   5/5   Toe extensors  5/5   5/5   Toe flexors  5/5   5/5   Tone (Ashworth scale)  0  0   MSRs:  Right        Left                  brachioradialis 2+  2+  biceps 2+  2+  triceps 2+  2+  patellar 2+  2+  ankle jerk 2+  2+  Hoffman no  no  plantar response down  down   SENSORY:  Vibration reduced distal to ankles bilaterally, temperature is diminished over the feet, pin prick is intact.  Sensation to all modalities normal in the hands.  Rhomberg sign is positive.   COORDINATION/GAIT: Normal finger-to- nose-finger.  Intact rapid alternating movements bilaterally.  Able to rise from a chair without using arms.  Gait narrow based and stable. Mild unsteadiness with tandem gait, but able to perform.  Stressed gait intact.   IMPRESSION: Idiopathic peripheral neuropathy manifesting with numbness and mild sensory ataxia.  Prior testing has included NCS/EMG and labs.  No risk factors for neuropathy. His neurological examination shows a distal predominant large fiber peripheral neuropathy. I had extensive discussion with the patient regarding the pathogenesis, etiology, management, and natural course of neuropathy. Neuropathy tends to be slowly progressive, especially if a treatable etiology is not identified.. I discussed that in the vast majority of  cases, despite checking for reversible causes, we are unable to find the underlying etiology and management is symptomatic.  There is no treatment for numbness, fortunately, pain is minimal.  Patient educated on daily foot inspection, fall prevention, and safety precautions around the home.  If balance progresses in the future, PT can be ordered.  At this time, he appears very stable and walks unassisted without difficulty.   Return to clinic as needed   Thank you for allowing me to participate in patient's care.  If I can answer any additional questions, I would be pleased to do so.    Sincerely,    Nehal Shives K. Posey Pronto, DO

## 2020-09-07 ENCOUNTER — Ambulatory Visit: Payer: Federal, State, Local not specified - PPO | Admitting: Neurology

## 2020-09-14 DIAGNOSIS — N200 Calculus of kidney: Secondary | ICD-10-CM | POA: Diagnosis not present

## 2020-09-14 DIAGNOSIS — N210 Calculus in bladder: Secondary | ICD-10-CM | POA: Diagnosis not present

## 2020-09-14 DIAGNOSIS — N401 Enlarged prostate with lower urinary tract symptoms: Secondary | ICD-10-CM | POA: Diagnosis not present

## 2020-09-14 DIAGNOSIS — R3912 Poor urinary stream: Secondary | ICD-10-CM | POA: Diagnosis not present

## 2020-09-15 ENCOUNTER — Ambulatory Visit (INDEPENDENT_AMBULATORY_CARE_PROVIDER_SITE_OTHER): Payer: Federal, State, Local not specified - PPO | Admitting: Podiatry

## 2020-09-15 DIAGNOSIS — Z5329 Procedure and treatment not carried out because of patient's decision for other reasons: Secondary | ICD-10-CM

## 2020-09-15 DIAGNOSIS — W540XXA Bitten by dog, initial encounter: Secondary | ICD-10-CM | POA: Diagnosis not present

## 2020-09-15 DIAGNOSIS — S91011A Laceration without foreign body, right ankle, initial encounter: Secondary | ICD-10-CM | POA: Diagnosis not present

## 2020-09-15 NOTE — Progress Notes (Signed)
No show for appt. 

## 2020-09-18 DIAGNOSIS — L218 Other seborrheic dermatitis: Secondary | ICD-10-CM | POA: Diagnosis not present

## 2020-09-18 DIAGNOSIS — L301 Dyshidrosis [pompholyx]: Secondary | ICD-10-CM | POA: Diagnosis not present

## 2020-09-24 DIAGNOSIS — L309 Dermatitis, unspecified: Secondary | ICD-10-CM | POA: Diagnosis not present

## 2020-10-06 DIAGNOSIS — M47816 Spondylosis without myelopathy or radiculopathy, lumbar region: Secondary | ICD-10-CM | POA: Diagnosis not present

## 2020-10-06 DIAGNOSIS — M169 Osteoarthritis of hip, unspecified: Secondary | ICD-10-CM | POA: Diagnosis not present

## 2020-10-06 DIAGNOSIS — M79606 Pain in leg, unspecified: Secondary | ICD-10-CM | POA: Diagnosis not present

## 2020-10-06 DIAGNOSIS — Z79899 Other long term (current) drug therapy: Secondary | ICD-10-CM | POA: Diagnosis not present

## 2020-10-06 DIAGNOSIS — Z79891 Long term (current) use of opiate analgesic: Secondary | ICD-10-CM | POA: Diagnosis not present

## 2020-10-06 DIAGNOSIS — G894 Chronic pain syndrome: Secondary | ICD-10-CM | POA: Diagnosis not present

## 2020-10-23 ENCOUNTER — Ambulatory Visit (INDEPENDENT_AMBULATORY_CARE_PROVIDER_SITE_OTHER): Payer: Medicare Other | Admitting: Podiatry

## 2020-10-23 ENCOUNTER — Other Ambulatory Visit: Payer: Self-pay

## 2020-10-23 DIAGNOSIS — M79674 Pain in right toe(s): Secondary | ICD-10-CM

## 2020-10-23 DIAGNOSIS — B351 Tinea unguium: Secondary | ICD-10-CM

## 2020-10-23 DIAGNOSIS — G5762 Lesion of plantar nerve, left lower limb: Secondary | ICD-10-CM

## 2020-10-23 DIAGNOSIS — M79675 Pain in left toe(s): Secondary | ICD-10-CM

## 2020-10-23 MED ORDER — BETAMETHASONE SOD PHOS & ACET 6 (3-3) MG/ML IJ SUSP
3.0000 mg | Freq: Once | INTRAMUSCULAR | Status: AC
  Administered 2020-10-23: 3 mg

## 2020-10-23 NOTE — Progress Notes (Signed)
  Subjective:  Patient ID: Franklin Fisher, male    DOB: 1941/01/06,  MRN: [id number]  Chief Complaint  Patient presents with  . Nail Problem    RFC- No complaints or no new sx.     80 y.o. male presents with the above complaint. History confirmed with patient.   Does have hx of neuropathy, worse on left.  Objective:  Physical Exam: warm, good capillary refill, nail exam onychomycosis of the toenails, no trophic changes or ulcerative lesions. DP pulses palpable, PT pulses palpable and protective sensation intact Left Foot: tenderness between the 3rd and 4th metatarsal head, tinel's sign over 3rd interspace, decreased sensation 3rd,4th,5th toes. Right Foot: tenderness between the 3rd and 4th metatarsal head   No images are attached to the encounter.  Assessment:   1. Pain due to onychomycosis of toenails of both feet   2. Morton neuroma, left      Plan:  Patient was evaluated and treated and all questions answered.  Onychomycosis -Nails palliatively debrided secondary to pain  Procedure: Nail Debridement Type of Debridement: manual, sharp debridement. Instrumentation: Nail nipper, rotary burr. Number of Nails: 10  Neuromas bilat, worse L>R -Discussed etiology -Injection as below  Procedure: Neuroma Injection Location: Left 3rd interspace Skin Prep: Alcohol. Injectate: 0.5 cc 0.5% marcaine plain, 0.5 cc dexamethasone phosphate. Disposition: Patient tolerated procedure well. Injection site dressed with a band-aid.    Return in about 1 month (around 11/23/2020) for Neuroma, left foot.

## 2020-10-30 DIAGNOSIS — R196 Halitosis: Secondary | ICD-10-CM | POA: Insufficient documentation

## 2020-10-30 DIAGNOSIS — G4733 Obstructive sleep apnea (adult) (pediatric): Secondary | ICD-10-CM | POA: Diagnosis not present

## 2020-10-30 DIAGNOSIS — H6063 Unspecified chronic otitis externa, bilateral: Secondary | ICD-10-CM | POA: Diagnosis not present

## 2020-11-04 ENCOUNTER — Other Ambulatory Visit: Payer: Self-pay

## 2020-11-04 ENCOUNTER — Ambulatory Visit (INDEPENDENT_AMBULATORY_CARE_PROVIDER_SITE_OTHER): Payer: Medicare Other | Admitting: Podiatry

## 2020-11-04 DIAGNOSIS — T7840XA Allergy, unspecified, initial encounter: Secondary | ICD-10-CM | POA: Diagnosis not present

## 2020-11-04 MED ORDER — CLOTRIMAZOLE-BETAMETHASONE 1-0.05 % EX CREA: 1.0000 "application " | TOPICAL_CREAM | Freq: Every day | CUTANEOUS | 0 refills | Status: DC | PRN

## 2020-11-04 MED ORDER — CLOTRIMAZOLE-BETAMETHASONE 1-0.05 % EX CREA: 1.0000 "application " | TOPICAL_CREAM | Freq: Two times a day (BID) | CUTANEOUS | 0 refills | Status: DC

## 2020-11-05 ENCOUNTER — Encounter: Payer: Self-pay | Admitting: Podiatry

## 2020-11-05 NOTE — Progress Notes (Signed)
Subjective:  Patient ID: Franklin Fisher, male    DOB: [birth year],  MRN: [id number]  Chief Complaint  Patient presents with  . rash    Left foot. PT stated he has a cluster of red bumps on the top of his foot from an injection he had a few weeks ago, he stated that they itch and he has been using a fungal cream that helps with the itching.    80 y.o. male presents with the above complaint.  Patient presents with a new complaint of red bumps to the left dorsal foot.  Patient had a previous alcohol injection for treating neuroma by Dr. March Rummage in that area.  Patient states that the bandage was left for few days prior to him not realizing taking.  As far as he know he was not allergic to any type of Band-Aid.  However the itching as well as the redness appears to be correlating with the Band-Aid.  He denies any other acute complaints.  He thinks it may be allergic reaction which I agree.  He would like to discuss treatment options for it.  He has not taken Benadryl and does not want to take Benadryl if he can avoid it.   Review of Systems: Negative except as noted in the HPI. Denies N/V/F/Ch.  Past Medical History:  Diagnosis Date  . BPH (benign prostatic hyperplasia)   . Headache    stopped at age 12  . History of kidney stones    passes 1-2 per year  . Hypertension 2000   Dr. Orland Mustard  . Sleep apnea   . Stroke [facility])    TIA cat scans and MRI were WNL  . TIA (transient ischemic attack)     Current Outpatient Medications:  .  clotrimazole-betamethasone (LOTRISONE) cream, Apply 1 application topically 2 (two) times daily., Disp: 30 g, Rfl: 0 .  acyclovir (ZOVIRAX) 200 MG capsule, , Disp: , Rfl:  .  albuterol (PROVENTIL HFA;VENTOLIN HFA) 108 (90 Base) MCG/ACT inhaler, Inhale 2 puffs into the lungs every 6 (six) hours as needed for wheezing or shortness of breath. (Patient not taking: Reported on 08/13/2020), Disp: , Rfl:  .  aspirin EC 81 MG tablet, Take 1 tablet (81 mg total) by mouth 2 (two)  times daily. (Patient not taking: Reported on 08/13/2020), Disp: 60 tablet, Rfl: 0 .  atenolol (TENORMIN) 100 MG tablet, Take 100 mg by mouth daily., Disp: , Rfl:  .  B-D UF III MINI PEN NEEDLES 31G X 5 MM MISC, SMARTSIG:1 Each SUB-Q Daily (Patient not taking: Reported on 08/13/2020), Disp: , Rfl:  .  betamethasone dipropionate (DIPROLENE) 0.05 % ointment, , Disp: , Rfl:  .  clotrimazole (LOTRIMIN) 1 % cream, Apply to affected area twice daily until resolved (Patient not taking: Reported on 08/13/2020), Disp: , Rfl:  .  clotrimazole-betamethasone (LOTRISONE) cream, Apply 1 application topically daily as needed., Disp: 30 g, Rfl: 0 .  fluconazole (DIFLUCAN) 100 MG tablet, Take 100 mg by mouth daily. (Patient not taking: Reported on 08/13/2020), Disp: , Rfl:  .  hydroxypropyl methylcellulose / hypromellose (ISOPTO TEARS / GONIOVISC) 2.5 % ophthalmic solution, Place 2 drops into the left eye 3 (three) times daily as needed., Disp: , Rfl:  .  ketoconazole (NIZORAL) 2 % shampoo, Apply 1 application topically daily as needed for irritation. , Disp: , Rfl:  .  oxyCODONE-acetaminophen (PERCOCET) 7.5-325 MG tablet, Take 1 tablet by mouth 3 (three) times daily as needed., Disp: , Rfl:  .  tamsulosin (FLOMAX)  0.4 MG CAPS capsule, Take 0.8 mg by mouth daily. , Disp: , Rfl:  .  tiZANidine (ZANAFLEX) 2 MG tablet, Take 1 tablet (2 mg total) by mouth every 6 (six) hours as needed. (Patient not taking: Reported on 08/13/2020), Disp: 60 tablet, Rfl: 0 .  traZODone (DESYREL) 50 MG tablet, , Disp: , Rfl:  .  triamcinolone lotion (KENALOG) 0.1 %, , Disp: , Rfl:  .  VICTOZA 18 MG/3ML SOPN, Inject 1.8 mg into the skin daily. (Patient not taking: Reported on 08/13/2020), Disp: , Rfl:   Current Facility-Administered Medications:  .  diclofenac sodium (VOLTAREN) 1 % transdermal gel 2 g, 2 g, Topical, QID, Pete Pelt, PA-C  Social History   Tobacco Use  Smoking Status Never Smoker  Smokeless Tobacco Never Used     Allergies  Allergen Reactions  . Ivp Dye [Iodinated Diagnostic Agents] Hives   Objective:  There were no vitals filed for this visit. There is no height or weight on file to calculate BMI. Constitutional Well developed. Well nourished.  Vascular Dorsalis pedis pulses palpable bilaterally. Posterior tibial pulses palpable bilaterally. Capillary refill normal to all digits.  No cyanosis or clubbing noted. Pedal hair growth normal.  Neurologic Normal speech. Oriented to person, place, and time. Epicritic sensation to light touch grossly present bilaterally.  Dermatologic  raised papules noted across the dorsum of the foot that has subjective itching correlation with it.  Papules correlate with the application where the Band-Aid was.  No correlation with the alcohol injection  Orthopedic: Normal joint ROM without pain or crepitus bilaterally. No visible deformities. No bony tenderness.   Radiographs: None Assessment:   1. Allergic reaction, initial encounter    Plan:  Patient was evaluated and treated and all questions answered.  Left adhesive allergic reaction to Band-Aid dorsal foot -I explained the patient the etiology of allergic reaction and various treatment options were discussed.  Given that the raised papular correlating with a Band-Aid application I encouraged him to stop using Band-Aids.  Patient states understanding he may be allergic to adhesive in the Band-Aid. -At this time patient will benefit from Lotrisone cream apply twice a day.  As patient does not want to take Benadryl for now I encouraged him to hold off on Benadryl and apply the Lotrisone cream.  Patient states understanding will do so.  If there is no improvement he will come back and see me.  No follow-ups on file.

## 2020-11-10 DIAGNOSIS — N210 Calculus in bladder: Secondary | ICD-10-CM | POA: Diagnosis not present

## 2020-11-10 DIAGNOSIS — N401 Enlarged prostate with lower urinary tract symptoms: Secondary | ICD-10-CM | POA: Diagnosis not present

## 2020-11-10 DIAGNOSIS — R3912 Poor urinary stream: Secondary | ICD-10-CM | POA: Diagnosis not present

## 2020-11-10 DIAGNOSIS — N200 Calculus of kidney: Secondary | ICD-10-CM | POA: Diagnosis not present

## 2020-11-11 DIAGNOSIS — R109 Unspecified abdominal pain: Secondary | ICD-10-CM | POA: Diagnosis not present

## 2020-11-11 DIAGNOSIS — R7309 Other abnormal glucose: Secondary | ICD-10-CM | POA: Diagnosis not present

## 2020-11-11 DIAGNOSIS — R140 Abdominal distension (gaseous): Secondary | ICD-10-CM | POA: Diagnosis not present

## 2020-11-24 ENCOUNTER — Ambulatory Visit: Payer: Medicare Other | Admitting: Podiatry

## 2020-11-30 DIAGNOSIS — L738 Other specified follicular disorders: Secondary | ICD-10-CM | POA: Diagnosis not present

## 2020-11-30 DIAGNOSIS — L820 Inflamed seborrheic keratosis: Secondary | ICD-10-CM | POA: Diagnosis not present

## 2020-12-03 DIAGNOSIS — Z79899 Other long term (current) drug therapy: Secondary | ICD-10-CM | POA: Diagnosis not present

## 2020-12-03 DIAGNOSIS — M545 Low back pain, unspecified: Secondary | ICD-10-CM | POA: Diagnosis not present

## 2020-12-03 DIAGNOSIS — G629 Polyneuropathy, unspecified: Secondary | ICD-10-CM | POA: Diagnosis not present

## 2020-12-03 DIAGNOSIS — G894 Chronic pain syndrome: Secondary | ICD-10-CM | POA: Diagnosis not present

## 2020-12-03 DIAGNOSIS — Z79891 Long term (current) use of opiate analgesic: Secondary | ICD-10-CM | POA: Diagnosis not present

## 2020-12-03 DIAGNOSIS — R200 Anesthesia of skin: Secondary | ICD-10-CM | POA: Diagnosis not present

## 2020-12-08 ENCOUNTER — Encounter: Payer: Self-pay | Admitting: Gastroenterology

## 2020-12-08 ENCOUNTER — Ambulatory Visit (INDEPENDENT_AMBULATORY_CARE_PROVIDER_SITE_OTHER): Payer: Medicare Other | Admitting: Gastroenterology

## 2020-12-08 VITALS — BP 144/70 | HR 60 | Ht 63.75 in | Wt 205.0 lb

## 2020-12-08 DIAGNOSIS — R140 Abdominal distension (gaseous): Secondary | ICD-10-CM

## 2020-12-08 DIAGNOSIS — R1030 Lower abdominal pain, unspecified: Secondary | ICD-10-CM | POA: Diagnosis not present

## 2020-12-08 DIAGNOSIS — R143 Flatulence: Secondary | ICD-10-CM | POA: Diagnosis not present

## 2020-12-08 NOTE — Progress Notes (Signed)
Franklin Fisher    [id number]    [birth year]  Primary Care Physician:Morrow, Marjory Lies, MD  Referring Physician: London Pepper, MD [address] [place],  [place] [address]   Chief complaint:  Diarrhea, Gas, Abdominal cramping  HPI:  80 yr very pleasant male here with complaints of excessive gas, belching and flatulence.  He is also experiencing intermittent abdominal cramping and diarrhea.  All his symptoms started after he started taking Ozempic, he took it for about 7 weeks and then stop the medication but his symptoms have been progressive He has had endoscopic evaluation in the past, last EGD and colonoscopy was done in 2018  Denies any rectal bleeding or melena.  No unintentional weight loss or decreased appetite  Family history negative for GI malignancy  Outpatient Encounter Medications as of 12/08/2020  Medication Sig  . atenolol (TENORMIN) 100 MG tablet Take 100 mg by mouth daily.  . betamethasone dipropionate (DIPROLENE) 0.05 % ointment   . clotrimazole (LOTRIMIN) 1 % cream Apply to affected area twice daily until resolved  . clotrimazole-betamethasone (LOTRISONE) cream Apply 1 application topically daily as needed.  Marland Kitchen ketoconazole (NIZORAL) 2 % shampoo Apply 1 application topically daily as needed for irritation.   Marland Kitchen oxyCODONE-acetaminophen (PERCOCET) 7.5-325 MG tablet Take 1 tablet by mouth 3 (three) times daily as needed.  . tamsulosin (FLOMAX) 0.4 MG CAPS capsule Take 0.8 mg by mouth daily.   Marland Kitchen albuterol (PROVENTIL HFA;VENTOLIN HFA) 108 (90 Base) MCG/ACT inhaler Inhale 2 puffs into the lungs every 6 (six) hours as needed for wheezing or shortness of breath. (Patient not taking: No sig reported)  . [DISCONTINUED] acyclovir (ZOVIRAX) 200 MG capsule  (Patient not taking: Reported on 08/13/2020)  . [DISCONTINUED] aspirin EC 81 MG tablet Take 1 tablet (81 mg total) by mouth 2 (two) times daily. (Patient not taking: Reported on 08/13/2020)  .  [DISCONTINUED] B-D UF III MINI PEN NEEDLES 31G X 5 MM MISC SMARTSIG:1 Each SUB-Q Daily (Patient not taking: Reported on 08/13/2020)  . [DISCONTINUED] clotrimazole-betamethasone (LOTRISONE) cream Apply 1 application topically 2 (two) times daily.  . [DISCONTINUED] fluconazole (DIFLUCAN) 100 MG tablet Take 100 mg by mouth daily. (Patient not taking: Reported on 08/13/2020)  . [DISCONTINUED] hydroxypropyl methylcellulose / hypromellose (ISOPTO TEARS / GONIOVISC) 2.5 % ophthalmic solution Place 2 drops into the left eye 3 (three) times daily as needed.  . [DISCONTINUED] tiZANidine (ZANAFLEX) 2 MG tablet Take 1 tablet (2 mg total) by mouth every 6 (six) hours as needed. (Patient not taking: Reported on 08/13/2020)  . [DISCONTINUED] traZODone (DESYREL) 50 MG tablet  (Patient not taking: Reported on 08/13/2020)  . [DISCONTINUED] triamcinolone lotion (KENALOG) 0.1 %   . [DISCONTINUED] VICTOZA 18 MG/3ML SOPN Inject 1.8 mg into the skin daily. (Patient not taking: Reported on 08/13/2020)   Facility-Administered Encounter Medications as of 12/08/2020  Medication  . diclofenac sodium (VOLTAREN) 1 % transdermal gel 2 g    Allergies as of 12/08/2020 - Review Complete 12/08/2020  Allergen Reaction Noted  . Ivp dye [iodinated diagnostic agents] Hives 06/09/2018    Past Medical History:  Diagnosis Date  . BPH (benign prostatic hyperplasia)   . Gallstones   . Headache    stopped at age 57  . History of kidney stones    passes 1-2 per year  . Hypertension 2000   Dr. Orland Mustard  . Sleep apnea    CPAP  . Stroke [facility])    TIA  cat scans and MRI were WNL  . TIA (transient ischemic attack)     Past Surgical History:  Procedure Laterality Date  . CATARACT EXTRACTION, BILATERAL Bilateral 2015  . CHOLECYSTECTOMY    . INGUINAL HERNIA REPAIR     bi lat inguinal  . TONSILLECTOMY     age 33  . 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 UMBILICAL HERNIA REPAIR      Family History  Problem Relation Age of Onset  . Breast cancer Sister        died at 49  . Atrial fibrillation Mother        died 77  . Congestive Heart Failure Mother   . CAD Father   . Heart disease Maternal Grandmother   . Heart disease Maternal Grandfather     Social History   Socioeconomic History  . Marital status: Divorced    Spouse name: Not on file  . Number of children: 1  . Years of education: Not on file  . Highest education level: Not on file  Occupational History  . Occupation: retired  Tobacco Use  . Smoking status: Never Smoker  . Smokeless tobacco: Never Used  Vaping Use  . Vaping Use: Never used  Substance and Sexual Activity  . Alcohol use: Not Currently  . Drug use: Never  . Sexual activity: Not on file  Other Topics Concern  . Not on file  Social History Narrative   Right Handed   Lives in a apartment on the 2nd floor, Elevators available   Drinks caffeine 4-5 cups a day   Social Determinants of Health   Financial Resource Strain: Not on file  Food Insecurity: Not on file  Transportation Needs: Not on file  Physical Activity: Not on file  Stress: Not on file  Social Connections: Not on file  Intimate Partner Violence: Not on file      Review of systems: All other review of systems negative except as mentioned in the HPI.   Physical Exam: Vitals:   12/08/20 1348  BP: (!) 144/70  Pulse: 60   Body mass index is 35.46 kg/m. Gen:      No acute distress HEENT:  sclera anicteric Abd:      soft, non-tender; no palpable masses, no distension Ext:    No edema Neuro: alert and oriented x 3 Psych: normal mood and affect  Data Reviewed:  Reviewed labs, radiology imaging, old records and pertinent past GI work up   Assessment and Plan/Recommendations:  80 year old very pleasant gentleman with complaints of excessive gas,  belching, flatulence, abdominal bloating and cramping with intermittent diarrhea  His symptoms could be secondary to lactose intolerance.  Advised patient to do a trial of lactose-free diet for 1 to 2 weeks.  If he continues to have persistent symptoms will consider further work-up to exclude small intestinal bacterial overgrowth  Use simethicone and IBgard 1 capsule up to 3 times daily as needed  Return in 6 to 8 weeks or sooner if needed   The patient was provided an opportunity to ask questions and all were answered. The patient agreed with the plan and demonstrated an understanding of the instructions.  Damaris Hippo , MD    CC: London Pepper, MD

## 2020-12-08 NOTE — Patient Instructions (Addendum)
Take IBgard 1 capsule three times a day as needed  Take Simethicone 1 capsule three times a day as needed  Follow a Lactose free diet 1-2 weeks   Lactose-Free Diet, Adult If you have lactose intolerance, you are not able to digest lactose. Lactose is a natural sugar found mainly in dairy milk and dairy products. You may need to avoid all foods and beverages that contain lactose. A lactose-free diet can help you do this. Which foods have lactose? Lactose is found in dairy milk and dairy products, such as:  Yogurt.  Cheese.  Butter.  Margarine.  Sour cream.  Cream.  Whipped toppings and nondairy creamers.  Ice cream and other dairy-based desserts. Lactose is also found in foods or products made with dairy milk or milk ingredients. To find out whether a food contains dairy milk or a milk ingredient, look at the ingredients list. Avoid foods with the statement "May contain milk" and foods that contain:  Milk powder.  Whey.  Curd.  Caseinate.  Lactose.  Lactalbumin.  Lactoglobulin. What are alternatives to dairy milk and foods made with milk products?  Lactose-free milk.  Soy milk with added calcium and vitamin D.  Almond milk, coconut milk, rice milk, or other nondairy milk alternatives with added calcium and vitamin D. Note that these are low in protein.  Soy products, such as soy yogurt, soy cheese, soy ice cream, and soy-based sour cream.  Other nut milk products, such as almond yogurt, almond cheese, cashew yogurt, cashew cheese, cashew ice cream, coconut yogurt, and coconut ice cream. What are tips for following this plan?  Do not consume foods, beverages, vitamins, minerals, or medicines containing lactose. Read ingredient lists carefully.  Look for the words "lactose-free" on labels.  Use lactase enzyme drops or tablets as directed by your health care provider.  Use lactose-free milk or a milk alternative, such as soy milk or almond milk, for drinking  and cooking.  Make sure you get enough calcium and vitamin D in your diet. A lactose-free eating plan can be lacking in these important nutrients.  Take calcium and vitamin D supplements as directed by your health care provider. Talk to your health care provider about supplements if you are not able to get enough calcium and vitamin D from food. What foods can I eat? Fruits All fresh, canned, frozen, or dried fruits that are not processed with lactose. Vegetables All fresh, frozen, and canned vegetables without cheese, cream, or butter sauces. Grains Any that are not made with dairy milk or dairy products. Meats and other proteins Any meat, fish, poultry, and other protein sources that are not made with dairy milk or dairy products. Soy cheese and yogurt. Fats and oils Any that are not made with dairy milk or dairy products. Beverages Lactose-free milk. Soy, rice, or almond milk with added calcium and vitamin D. Fruit and vegetable juices. Sweets and desserts Any that are not made with dairy milk or dairy products. Seasonings and condiments Any that are not made with dairy milk or dairy products. Calcium Calcium is found in many foods that contain lactose and is important for bone health. The amount of calcium you need depends on your age:  Adults younger than 50 years: 1,000 mg of calcium a day.  Adults older than 50 years: 1,200 mg of calcium a day. If you are not getting enough calcium, you may get it from other sources, including:  Orange juice with calcium added. There are 300-350 mg of  calcium in 1 cup of orange juice.  Calcium-fortified soy milk. There are 300-400 mg of calcium in 1 cup of calcium-fortified soy milk.  Calcium-fortified rice or almond milk. There are 300 mg of calcium in 1 cup of calcium-fortified rice or almond milk.  Calcium-fortified breakfast cereals. There are 100-1,000 mg of calcium in calcium-fortified breakfast cereals.  Spinach, cooked. There are  145 mg of calcium in  cup of cooked spinach.  Edamame, cooked. There are 130 mg of calcium in  cup of cooked edamame.  Collard greens, cooked. There are 125 mg of calcium in  cup of cooked collard greens.  Kale, frozen or cooked. There are 90 mg of calcium in  cup of cooked or frozen kale.  Almonds. There are 95 mg of calcium in  cup of almonds.  Broccoli, cooked. There are 60 mg of calcium in 1 cup of cooked broccoli. The items listed above may not be a complete list of recommended foods and beverages. Contact a dietitian for more options.   What foods are not recommended? Fruits None, unless they are made with dairy milk or dairy products. Vegetables None, unless they are made with dairy milk or dairy products. Grains Any grains that are made with dairy milk or dairy products. Meats and other proteins None, unless they are made with dairy milk or dairy products. Dairy All dairy products, including milk, goat's milk, buttermilk, kefir, acidophilus milk, flavored milk, evaporated milk, condensed milk, dulce de [place], eggnog, yogurt, cheese, and cheese spreads. Fats and oils Any that are made with milk or milk products. Margarines and salad dressings that contain milk or cheese. Cream. Half and half. Cream cheese. Sour cream. Chip dips made with sour cream or yogurt. Beverages Hot chocolate. Cocoa with lactose. Instant iced teas. Powdered fruit drinks. Smoothies made with dairy milk or yogurt. Sweets and desserts Any that are made with milk or milk products. Seasonings and condiments Chewing gum that has lactose. Spice blends if they contain lactose. Artificial sweeteners that contain lactose. Nondairy creamers. The items listed above may not be a complete list of foods and beverages to avoid. Contact a dietitian for more information. Summary  If you are lactose intolerant, it means that you have a hard time digesting lactose, a natural sugar found in milk and milk  products.  Following a lactose-free diet can help you manage this condition.  Calcium is important for bone health and is found in many foods that contain lactose. Talk with your health care provider about other sources of calcium. This information is not intended to replace advice given to you by your health care provider. Make sure you discuss any questions you have with your health care provider. Document Revised: 10/31/2017 Document Reviewed: 10/31/2017 Elsevier Patient Education  [place].   Follow up in 3 months  I appreciate the  opportunity to care for you  Thank You   Harl Bowie , MD

## 2020-12-12 IMAGING — CR CHEST - 2 VIEW
3 series · 3 of 3 positions shown · non-contrast
Comparison: None.

CLINICAL DATA: Preoperative study prior to hip replacement.

EXAM:
CHEST - 2 VIEW

[w chest pa]
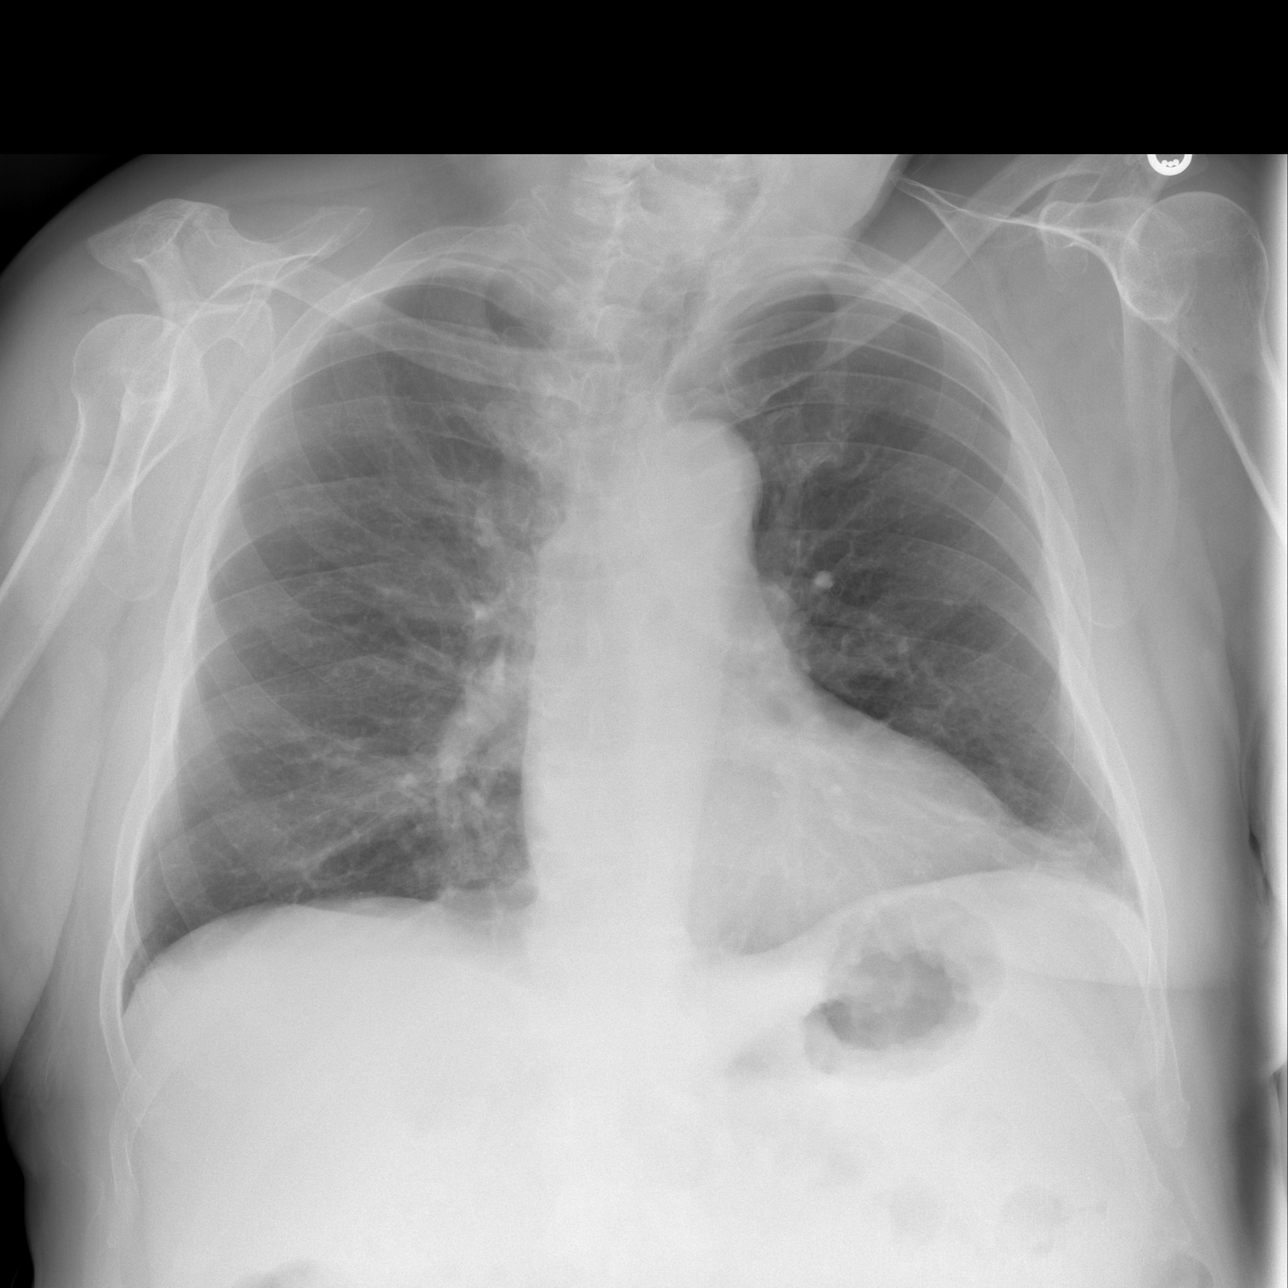

[w chest lat (1 of 2)]
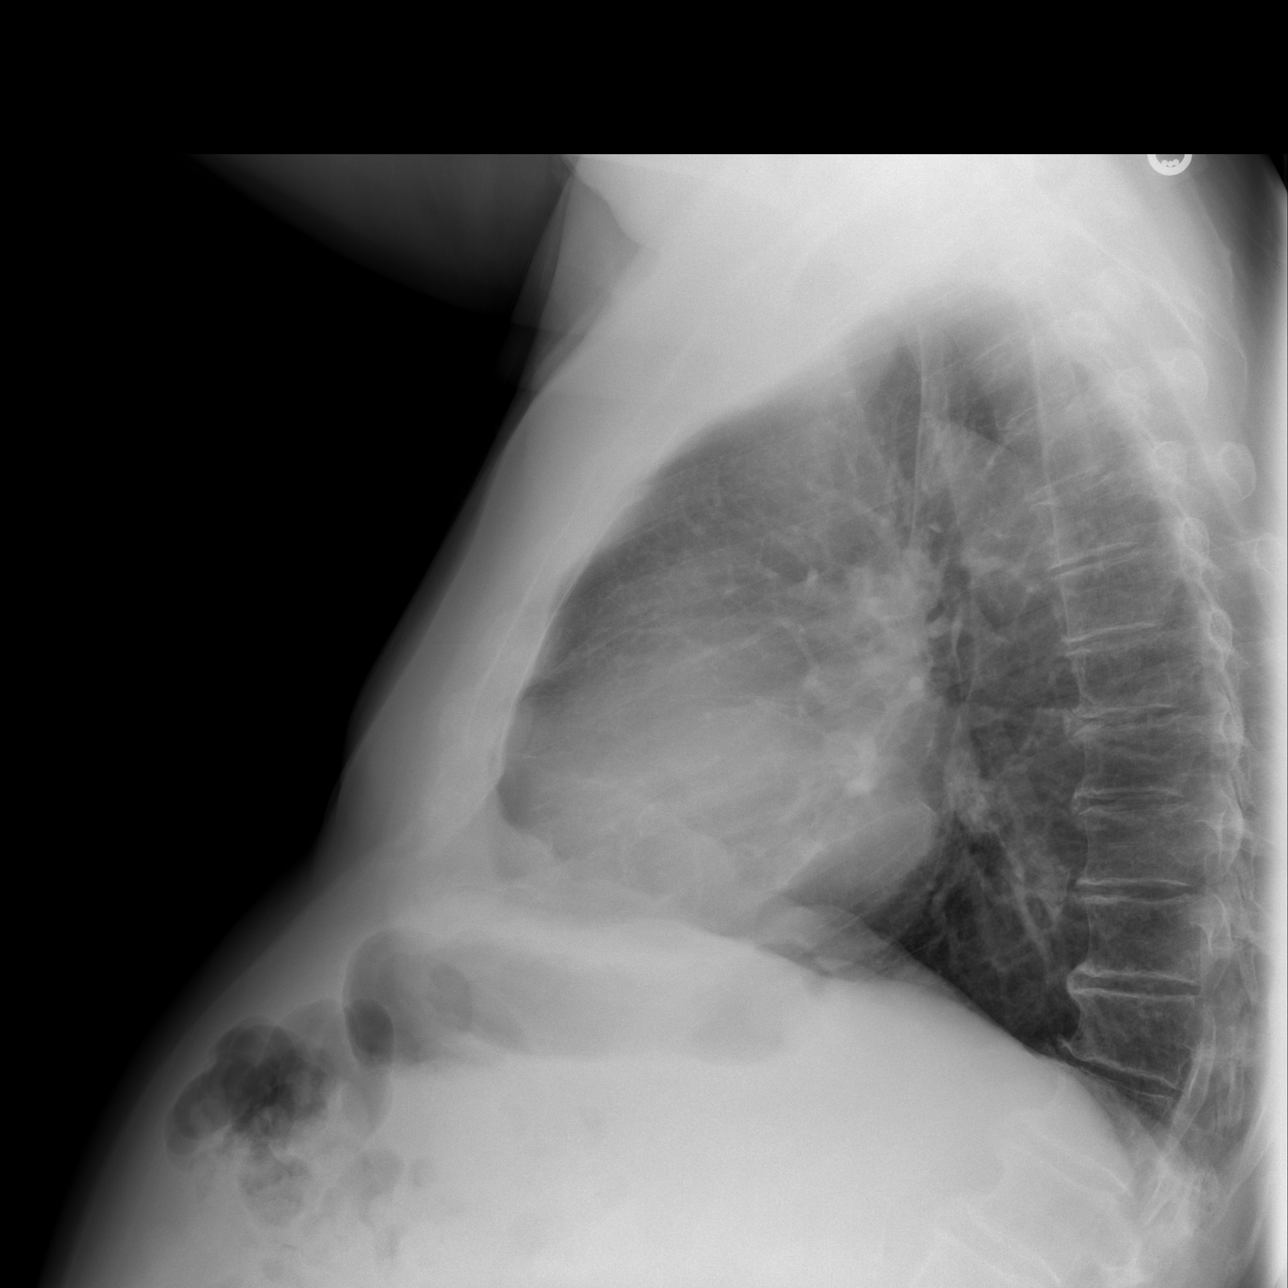

[w chest lat (2 of 2)]
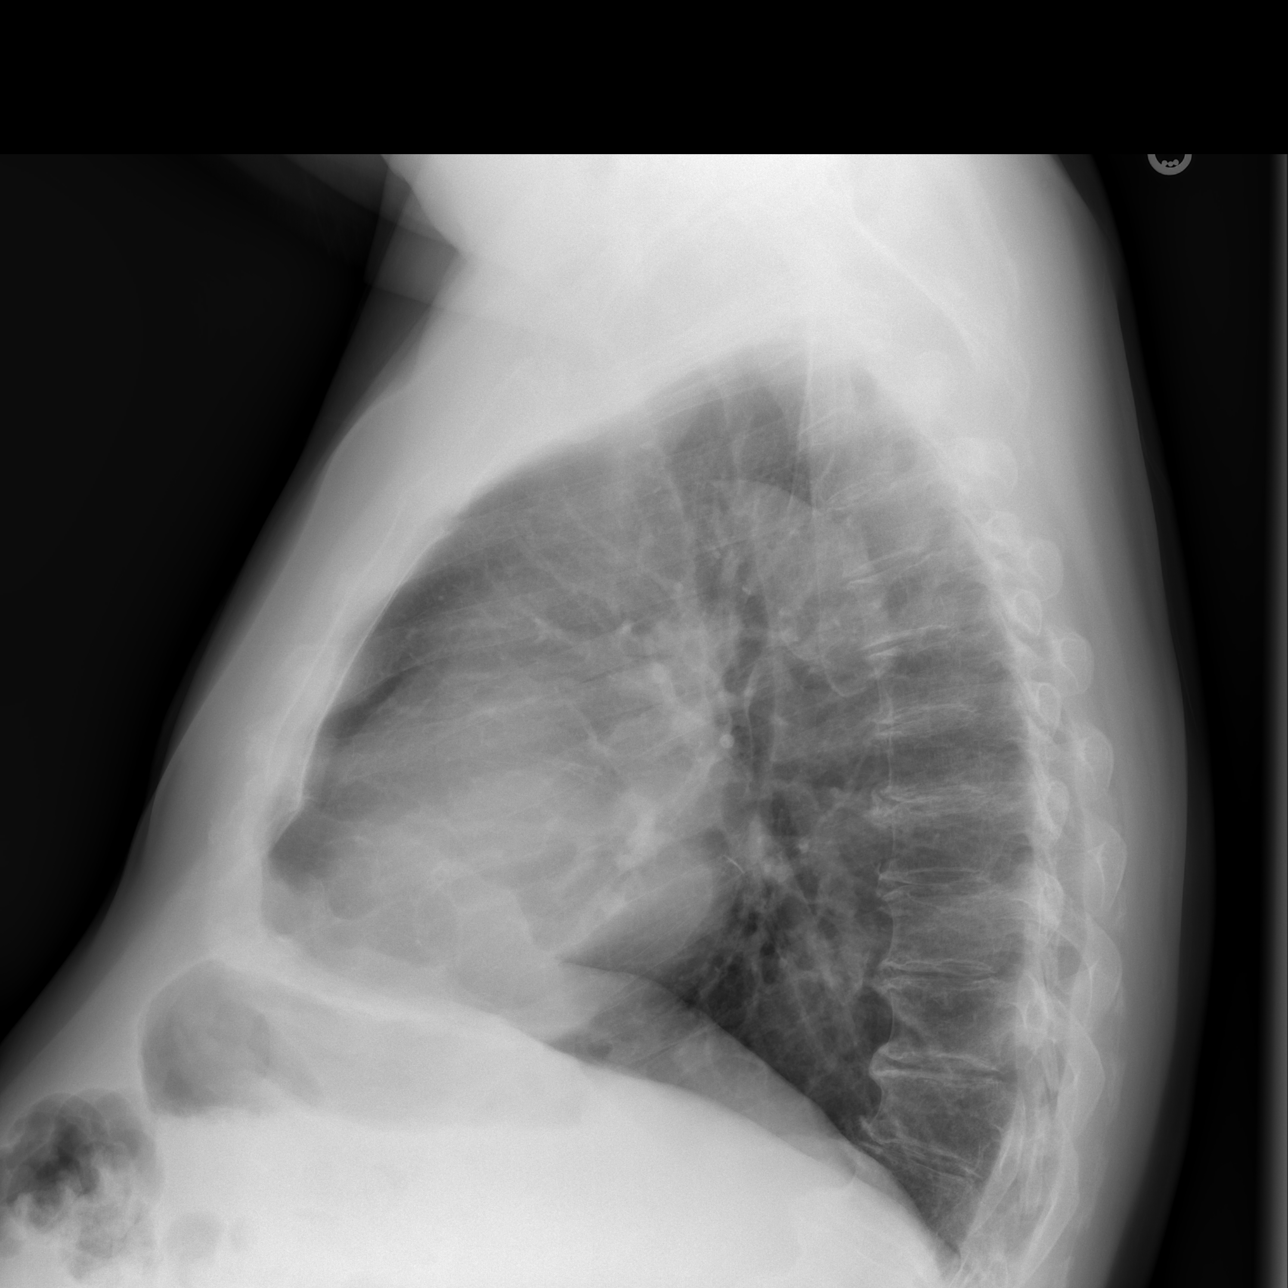

[3 of 3 positions shown; findings below may reference images not displayed]

FINDINGS: Atelectasis in the left base. The heart, hila, mediastinum, lungs,
and pleura are otherwise unremarkable.
IMPRESSION: Mild atelectasis in the left base. No other significant
abnormalities.

## 2020-12-22 ENCOUNTER — Encounter: Payer: Self-pay | Admitting: Gastroenterology

## 2020-12-25 ENCOUNTER — Other Ambulatory Visit: Payer: Self-pay

## 2020-12-25 ENCOUNTER — Ambulatory Visit (INDEPENDENT_AMBULATORY_CARE_PROVIDER_SITE_OTHER): Payer: Medicare Other | Admitting: Podiatry

## 2020-12-25 DIAGNOSIS — M79674 Pain in right toe(s): Secondary | ICD-10-CM

## 2020-12-25 DIAGNOSIS — B351 Tinea unguium: Secondary | ICD-10-CM

## 2020-12-25 DIAGNOSIS — G609 Hereditary and idiopathic neuropathy, unspecified: Secondary | ICD-10-CM | POA: Diagnosis not present

## 2020-12-25 DIAGNOSIS — M79675 Pain in left toe(s): Secondary | ICD-10-CM | POA: Diagnosis not present

## 2020-12-25 NOTE — Progress Notes (Signed)
  Subjective:  Patient ID: Franklin Fisher, male    DOB: 1941/01/16,  MRN: [id number]  Chief Complaint  Patient presents with  . Nail Problem    RFC nail trim. No complaints.     80 y.o. male presents with the above complaint. History confirmed with patient. States the nails are ingrown and hurt.  Does have hx of neuropathy, worse on left.  Objective:  Physical Exam: warm, good capillary refill, nail exam onychomycosis of the toenails, no trophic changes or ulcerative lesions. DP pulses palpable, PT pulses palpable and protective sensation intact Left Foot: tenderness between the 3rd and 4th metatarsal head, tinel's sign over 3rd interspace, decreased sensation 3rd,4th,5th toes. Right Foot: tenderness between the 3rd and 4th metatarsal head   No images are attached to the encounter.  Assessment:   1. Pain due to onychomycosis of toenails of both feet   2. Peripheral neuropathy, idiopathic    Plan:  Patient was evaluated and treated and all questions answered.  Onychomycosis -Nails palliatively debrided secondary to pain and dx of neuropathy   Procedure: Nail Debridement Type of Debridement: manual, sharp debridement. Instrumentation: Nail nipper, rotary burr. Number of Nails: 10       Return if symptoms worsen or fail to improve.

## 2021-01-04 DIAGNOSIS — R200 Anesthesia of skin: Secondary | ICD-10-CM | POA: Diagnosis not present

## 2021-01-04 DIAGNOSIS — G629 Polyneuropathy, unspecified: Secondary | ICD-10-CM | POA: Diagnosis not present

## 2021-01-04 DIAGNOSIS — M79604 Pain in right leg: Secondary | ICD-10-CM | POA: Diagnosis not present

## 2021-01-04 DIAGNOSIS — G894 Chronic pain syndrome: Secondary | ICD-10-CM | POA: Diagnosis not present

## 2021-01-11 DIAGNOSIS — L820 Inflamed seborrheic keratosis: Secondary | ICD-10-CM | POA: Diagnosis not present

## 2021-01-11 DIAGNOSIS — L570 Actinic keratosis: Secondary | ICD-10-CM | POA: Diagnosis not present

## 2021-02-01 DIAGNOSIS — M25551 Pain in right hip: Secondary | ICD-10-CM | POA: Diagnosis not present

## 2021-02-11 DIAGNOSIS — R200 Anesthesia of skin: Secondary | ICD-10-CM | POA: Diagnosis not present

## 2021-02-11 DIAGNOSIS — M79604 Pain in right leg: Secondary | ICD-10-CM | POA: Diagnosis not present

## 2021-02-11 DIAGNOSIS — M5459 Other low back pain: Secondary | ICD-10-CM | POA: Diagnosis not present

## 2021-02-11 DIAGNOSIS — M25559 Pain in unspecified hip: Secondary | ICD-10-CM | POA: Diagnosis not present

## 2021-02-11 DIAGNOSIS — G629 Polyneuropathy, unspecified: Secondary | ICD-10-CM | POA: Diagnosis not present

## 2021-02-11 DIAGNOSIS — G894 Chronic pain syndrome: Secondary | ICD-10-CM | POA: Diagnosis not present

## 2021-02-16 ENCOUNTER — Telehealth: Payer: Self-pay | Admitting: Gastroenterology

## 2021-02-16 NOTE — Telephone Encounter (Signed)
Spoke with the patient. Reports he started having stool leakage about 3 weeks ago. He is unaware of it when it is happening. He cleans multiple times a day and there is always stool. He is taking daily Kaopectate without improvement. He has also noticed his deliberate bowel movements have changed in caliper. Denies blood, mucous, discharge or pain. Appointment scheduled for evaluation.

## 2021-02-17 DIAGNOSIS — N390 Urinary tract infection, site not specified: Secondary | ICD-10-CM | POA: Diagnosis not present

## 2021-02-17 DIAGNOSIS — R309 Painful micturition, unspecified: Secondary | ICD-10-CM | POA: Diagnosis not present

## 2021-02-18 DIAGNOSIS — M25551 Pain in right hip: Secondary | ICD-10-CM | POA: Diagnosis not present

## 2021-02-19 ENCOUNTER — Encounter: Payer: Self-pay | Admitting: Gastroenterology

## 2021-02-19 ENCOUNTER — Ambulatory Visit (INDEPENDENT_AMBULATORY_CARE_PROVIDER_SITE_OTHER): Payer: Medicare Other | Admitting: Gastroenterology

## 2021-02-19 VITALS — BP 128/74 | HR 62 | Ht 63.75 in | Wt 207.4 lb

## 2021-02-19 DIAGNOSIS — R140 Abdominal distension (gaseous): Secondary | ICD-10-CM | POA: Diagnosis not present

## 2021-02-19 DIAGNOSIS — R159 Full incontinence of feces: Secondary | ICD-10-CM | POA: Diagnosis not present

## 2021-02-19 DIAGNOSIS — K582 Mixed irritable bowel syndrome: Secondary | ICD-10-CM

## 2021-02-19 NOTE — Patient Instructions (Signed)
Take benefiber 1 tablespoon twice a day  Increase fiber diet, High Fiber Diet given  Increase water intake to 8-10 cups daily  We will refer you to Pelvic floor PT and they will contact you with that appointment  Follow up in 3 months  Due to recent changes in healthcare laws, you may see the results of your imaging and laboratory studies on MyChart before your provider has had a chance to review them.  We understand that in some cases there may be results that are confusing or concerning to you. Not all laboratory results come back in the same time frame and the provider may be waiting for multiple results in order to interpret others.  Please give [place] 48 hours in order for your provider to thoroughly review all the results before contacting the office for clarification of your results.   I appreciate the  opportunity to care for you  Thank You   Harl Bowie , MD

## 2021-02-19 NOTE — Progress Notes (Signed)
Franklin Fisher    [id number]    [birth year]  Primary Care Physician:Morrow, Marjory Lies, MD  Referring Physician: London Pepper, MD [address],  [place] [address]   Chief complaint: Fecal incontinence, diarrhea   HPI:  80 yr very pleasant male here with complaints of excessive gas, belching and flatulence.  He is also experiencing intermittent abdominal cramping and diarrhea.  All his symptoms started after he started taking Ozempic, he took it for about 7 weeks and then stop the medication but his symptoms have been progressive   He is having BM once every 1-3 days, soft sometimes greasy looking, thin and longer. No blood or change in color of stool He doesn't feel he is completely evacuating He has to clean up multiple times and has residue in perianal region  He has had endoscopic evaluation in the past, last EGD and colonoscopy was done in 2018  Post-Operative Diagnosis:    - Preparation of the colon was fair.    - Diverticulosis in the entire examined colon.    - Non-bleeding internal hemorrhoids.    - No specimens collected.    - Preparation was poor    Denies any rectal bleeding or melena.  No unintentional weight loss or decreased appetite  Family history negative for GI malignancy   Outpatient Encounter Medications as of 02/19/2021  Medication Sig  . albuterol (PROVENTIL HFA;VENTOLIN HFA) 108 (90 Base) MCG/ACT inhaler Inhale 2 puffs into the lungs every 6 (six) hours as needed for wheezing or shortness of breath.  Marland Kitchen atenolol (TENORMIN) 100 MG tablet Take 100 mg by mouth daily.  . betamethasone dipropionate (DIPROLENE) 0.05 % ointment   . clotrimazole (LOTRIMIN) 1 % cream Apply to affected area twice daily until resolved  . clotrimazole-betamethasone (LOTRISONE) cream Apply 1 application topically daily as needed.  Marland Kitchen ketoconazole (NIZORAL) 2 % shampoo Apply 1 application topically daily as needed for irritation.    Marland Kitchen oxyCODONE-acetaminophen (PERCOCET) 7.5-325 MG tablet Take 1 tablet by mouth 3 (three) times daily as needed.  . tamsulosin (FLOMAX) 0.4 MG CAPS capsule Take 0.8 mg by mouth daily.    Facility-Administered Encounter Medications as of 02/19/2021  Medication  . diclofenac sodium (VOLTAREN) 1 % transdermal gel 2 g    Allergies as of 02/19/2021 - Review Complete 02/19/2021  Allergen Reaction Noted  . Ivp dye [iodinated diagnostic agents] Hives 06/09/2018    Past Medical History:  Diagnosis Date  . BPH (benign prostatic hyperplasia)   . Gallstones   . Headache    stopped at age 80  . History of kidney stones    passes 1-2 per year  . Hypertension 2000   Dr. Orland Mustard  . Sleep apnea    CPAP  . Stroke [facility])    TIA cat scans and MRI were WNL  . TIA (transient ischemic attack)     Past Surgical History:  Procedure Laterality Date  . CATARACT EXTRACTION, BILATERAL Bilateral 2015  . CHOLECYSTECTOMY    . INGUINAL HERNIA REPAIR     bi lat inguinal  . TONSILLECTOMY     age 73  . 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 UMBILICAL HERNIA REPAIR      Family History  Problem Relation Age of Onset  . Breast cancer Sister  died at 28  . Atrial fibrillation Mother        died 28  . Congestive Heart Failure Mother   . CAD Father   . Heart disease Maternal Grandmother   . Heart disease Maternal Grandfather     Social History   Socioeconomic History  . Marital status: Divorced    Spouse name: Not on file  . Number of children: 1  . Years of education: Not on file  . Highest education level: Not on file  Occupational History  . Occupation: retired  Tobacco Use  . Smoking status: Never Smoker  . Smokeless tobacco: Never Used  Vaping Use  . Vaping Use: Never used  Substance and Sexual Activity  . Alcohol use: Not Currently   . Drug use: Never  . Sexual activity: Not on file  Other Topics Concern  . Not on file  Social History Narrative   Right Handed   Lives in a apartment on the 2nd floor, Elevators available   Drinks caffeine 4-5 cups a day   Social Determinants of Health   Financial Resource Strain: Not on file  Food Insecurity: Not on file  Transportation Needs: Not on file  Physical Activity: Not on file  Stress: Not on file  Social Connections: Not on file  Intimate Partner Violence: Not on file      Review of systems: All other review of systems negative except as mentioned in the HPI.   Physical Exam: Vitals:   02/19/21 0835  BP: 128/74  Pulse: 62  SpO2: 96%   Body mass index is 35.88 kg/m. Gen:      No acute distress HEENT:  sclera anicteric Abd:      soft, non-tender; no palpable masses, no distension Ext:    No edema Neuro: alert and oriented x 3 Psych: normal mood and affect  Data Reviewed:  Reviewed labs, radiology imaging, old records and pertinent past GI work up   Assessment and Plan/Recommendations:  80 year old very pleasant gentleman very pleasant gentleman with complaints of excessive gas, belching, flatulence, abdominal bloating and cramping with intermittent diarrhea  Irregular bowel habits with alternating constipation and diarrhea Increase dietary fiber and water intake to 8 to 10 cups daily Use Benefiber 1 tablespoon spoon 2-3 times daily with meals  If he continues to have significant diarrhea, 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 , may also consider colonoscopy with biopsies to exclude microscopic colitis  Sensation of incomplete evacuation and 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 The patient was provided an opportunity to ask questions and all were answered. The patient agreed with the plan and demonstrated an understanding of the instructions.  Damaris Hippo , MD    CC: London Pepper, MD

## 2021-02-23 DIAGNOSIS — N410 Acute prostatitis: Secondary | ICD-10-CM | POA: Diagnosis not present

## 2021-02-24 DIAGNOSIS — R140 Abdominal distension (gaseous): Secondary | ICD-10-CM | POA: Diagnosis not present

## 2021-02-24 DIAGNOSIS — L309 Dermatitis, unspecified: Secondary | ICD-10-CM | POA: Diagnosis not present

## 2021-03-01 ENCOUNTER — Encounter: Payer: Self-pay | Admitting: Gastroenterology

## 2021-03-11 DIAGNOSIS — G894 Chronic pain syndrome: Secondary | ICD-10-CM | POA: Diagnosis not present

## 2021-03-11 DIAGNOSIS — Z79891 Long term (current) use of opiate analgesic: Secondary | ICD-10-CM | POA: Diagnosis not present

## 2021-03-11 DIAGNOSIS — M79604 Pain in right leg: Secondary | ICD-10-CM | POA: Diagnosis not present

## 2021-03-11 DIAGNOSIS — R200 Anesthesia of skin: Secondary | ICD-10-CM | POA: Diagnosis not present

## 2021-03-11 DIAGNOSIS — G629 Polyneuropathy, unspecified: Secondary | ICD-10-CM | POA: Diagnosis not present

## 2021-03-11 DIAGNOSIS — Z79899 Other long term (current) drug therapy: Secondary | ICD-10-CM | POA: Diagnosis not present

## 2021-03-17 DIAGNOSIS — N200 Calculus of kidney: Secondary | ICD-10-CM | POA: Diagnosis not present

## 2021-03-17 DIAGNOSIS — R3912 Poor urinary stream: Secondary | ICD-10-CM | POA: Diagnosis not present

## 2021-03-17 DIAGNOSIS — N401 Enlarged prostate with lower urinary tract symptoms: Secondary | ICD-10-CM | POA: Diagnosis not present

## 2021-03-17 DIAGNOSIS — N210 Calculus in bladder: Secondary | ICD-10-CM | POA: Diagnosis not present

## 2021-03-19 DIAGNOSIS — Z03818 Encounter for observation for suspected exposure to other biological agents ruled out: Secondary | ICD-10-CM | POA: Diagnosis not present

## 2021-03-19 DIAGNOSIS — B349 Viral infection, unspecified: Secondary | ICD-10-CM | POA: Diagnosis not present

## 2021-03-19 DIAGNOSIS — R509 Fever, unspecified: Secondary | ICD-10-CM | POA: Diagnosis not present

## 2021-03-23 ENCOUNTER — Encounter: Payer: Self-pay | Admitting: Podiatry

## 2021-03-23 ENCOUNTER — Ambulatory Visit (INDEPENDENT_AMBULATORY_CARE_PROVIDER_SITE_OTHER): Payer: Medicare Other | Admitting: Podiatry

## 2021-03-23 ENCOUNTER — Other Ambulatory Visit: Payer: Self-pay

## 2021-03-23 DIAGNOSIS — M549 Dorsalgia, unspecified: Secondary | ICD-10-CM | POA: Insufficient documentation

## 2021-03-23 DIAGNOSIS — R739 Hyperglycemia, unspecified: Secondary | ICD-10-CM | POA: Insufficient documentation

## 2021-03-23 DIAGNOSIS — M79675 Pain in left toe(s): Secondary | ICD-10-CM

## 2021-03-23 DIAGNOSIS — G2581 Restless legs syndrome: Secondary | ICD-10-CM | POA: Insufficient documentation

## 2021-03-23 DIAGNOSIS — G4700 Insomnia, unspecified: Secondary | ICD-10-CM | POA: Insufficient documentation

## 2021-03-23 DIAGNOSIS — H9312 Tinnitus, left ear: Secondary | ICD-10-CM | POA: Insufficient documentation

## 2021-03-23 DIAGNOSIS — L600 Ingrowing nail: Secondary | ICD-10-CM | POA: Diagnosis not present

## 2021-03-23 DIAGNOSIS — Z96649 Presence of unspecified artificial hip joint: Secondary | ICD-10-CM | POA: Insufficient documentation

## 2021-03-23 MED ORDER — CEPHALEXIN 500 MG PO CAPS: 500.0000 mg | ORAL_CAPSULE | Freq: Three times a day (TID) | ORAL | 0 refills | Status: DC

## 2021-03-23 NOTE — Patient Instructions (Signed)
Soak Instructions- HAVE SOMEONE ELSE CHECK THE TEMPERATURE OF THE WATER    THE DAY AFTER THE PROCEDURE  Place 1/4 cup of epsom salts in a quart of warm tap water.  Submerge your foot or feet with outer bandage intact for the initial soak; this will allow the bandage to become moist and wet for easy lift off.  Once you remove your bandage, continue to soak in the solution for 20 minutes.  This soak should be done twice a day.  Next, remove your foot or feet from solution, blot dry the affected area and cover.  You may use a band aid large enough to cover the area or use gauze and tape.  Apply other medications to the area as directed by the doctor such as polysporin neosporin.  IF YOUR SKIN BECOMES IRRITATED WHILE USING THESE INSTRUCTIONS, IT IS OKAY TO SWITCH TO  WHITE VINEGAR AND WATER. Or you may use antibacterial soap and water to keep the toe clean  Monitor for any signs/symptoms of infection. Call the office immediately if any occur or go directly to the emergency room. Call with any questions/concerns.

## 2021-03-28 NOTE — Progress Notes (Signed)
Subjective: 80 year old male presents the office today for concerns of discomfort to his left big toe.  His mom over the last 2 weeks he noticed that 1 more swollen and red previously was getting better but he went to have the area checked.  He points along the distal portion of the big toe on the left side with majority discomfort.  Denies any drainage coming from the toenail.  He states originally he could not even put a sheet over his toe given the discomfort.  Denies any recent injury. Denies any systemic complaints such as fevers, chills, nausea, vomiting. No acute changes since last appointment, and no other complaints at this time.   Objective: AAO x3, NAD DP/PT pulses palpable bilaterally, CRT less than 3 seconds There is tenderness to the distal portion of the left toe and this is mostly along the toenail both the medial lateral nail borders.  No surrounding palpation of the nail.  There is mild edema but there is no ascending cellulitis or drainage or pus coming from the toenail site.  No pain on the MPJ. No pain with calf compression, swelling, warmth, erythema  Assessment: Ingrown toenail left hallux   Plan: -All treatment options discussed with the patient including all alternatives, risks, complications.  -Prescribed Keflex.  Antibiotic ointment dressing changes daily.  Epson salt soaks but making sure someone else is checking the temperature of the water and dry thoroughly afterwards.  If symptoms continue may need to have a procedure performed but hopefully this will take care of it. -Patient encouraged to call the office with any questions, concerns, change in symptoms.   Trula Slade DPM

## 2021-04-09 ENCOUNTER — Ambulatory Visit (INDEPENDENT_AMBULATORY_CARE_PROVIDER_SITE_OTHER): Payer: Medicare Other | Admitting: Podiatry

## 2021-04-09 ENCOUNTER — Other Ambulatory Visit: Payer: Self-pay

## 2021-04-09 DIAGNOSIS — B351 Tinea unguium: Secondary | ICD-10-CM

## 2021-04-09 DIAGNOSIS — M79674 Pain in right toe(s): Secondary | ICD-10-CM | POA: Diagnosis not present

## 2021-04-09 DIAGNOSIS — M79675 Pain in left toe(s): Secondary | ICD-10-CM | POA: Diagnosis not present

## 2021-04-09 DIAGNOSIS — L600 Ingrowing nail: Secondary | ICD-10-CM

## 2021-04-09 NOTE — Progress Notes (Signed)
  Subjective:  Patient ID: Franklin Fisher, male    DOB: 1941-08-03,  MRN: [id number]  Chief Complaint  Patient presents with   Nail Problem    Nail check, pt states completely resolved.    80 y.o. male presents with the above complaint. History confirmed with patient. States the left great toe hasnt been bothering him but it does hurt when he squeezes it.  Objective:  Physical Exam: warm, good capillary refill, no trophic changes or ulcerative lesions, normal DP and PT pulses, and normal sensory exam.  Painful ingrowing nail at both borders of the left, right, hallux; without warmth, erythema or drainage  Assessment:   1. Pain due to onychomycosis of toenails of both feet   2. Ingrown toenail      Plan:  Patient was evaluated and treated and all questions answered.  Ingrown Nail, bilateral -Palliative debridement of ingrowing nails in slant-back fashion to patient relief.  Procedure: Nail Debridement Type of Debridement: manual, sharp debridement. Instrumentation: Nail nipper, rotary burr. Number of Nails: 10   No follow-ups on file.   MDM

## 2021-04-13 DIAGNOSIS — G894 Chronic pain syndrome: Secondary | ICD-10-CM | POA: Diagnosis not present

## 2021-04-13 DIAGNOSIS — G629 Polyneuropathy, unspecified: Secondary | ICD-10-CM | POA: Diagnosis not present

## 2021-04-13 DIAGNOSIS — M25559 Pain in unspecified hip: Secondary | ICD-10-CM | POA: Diagnosis not present

## 2021-04-13 DIAGNOSIS — M5459 Other low back pain: Secondary | ICD-10-CM | POA: Diagnosis not present

## 2021-04-22 DIAGNOSIS — N200 Calculus of kidney: Secondary | ICD-10-CM | POA: Diagnosis not present

## 2021-04-22 DIAGNOSIS — N210 Calculus in bladder: Secondary | ICD-10-CM | POA: Diagnosis not present

## 2021-05-07 DIAGNOSIS — R197 Diarrhea, unspecified: Secondary | ICD-10-CM | POA: Diagnosis not present

## 2021-05-10 ENCOUNTER — Ambulatory Visit: Payer: Medicare Other | Admitting: Physical Therapy

## 2021-05-11 DIAGNOSIS — G894 Chronic pain syndrome: Secondary | ICD-10-CM | POA: Diagnosis not present

## 2021-05-11 DIAGNOSIS — M47816 Spondylosis without myelopathy or radiculopathy, lumbar region: Secondary | ICD-10-CM | POA: Diagnosis not present

## 2021-05-11 DIAGNOSIS — M169 Osteoarthritis of hip, unspecified: Secondary | ICD-10-CM | POA: Diagnosis not present

## 2021-05-11 DIAGNOSIS — M79606 Pain in leg, unspecified: Secondary | ICD-10-CM | POA: Diagnosis not present

## 2021-05-17 DIAGNOSIS — L820 Inflamed seborrheic keratosis: Secondary | ICD-10-CM | POA: Diagnosis not present

## 2021-05-17 DIAGNOSIS — L720 Epidermal cyst: Secondary | ICD-10-CM | POA: Diagnosis not present

## 2021-05-17 DIAGNOSIS — D485 Neoplasm of uncertain behavior of skin: Secondary | ICD-10-CM | POA: Diagnosis not present

## 2021-06-08 DIAGNOSIS — M25559 Pain in unspecified hip: Secondary | ICD-10-CM | POA: Diagnosis not present

## 2021-06-08 DIAGNOSIS — G629 Polyneuropathy, unspecified: Secondary | ICD-10-CM | POA: Diagnosis not present

## 2021-06-08 DIAGNOSIS — G894 Chronic pain syndrome: Secondary | ICD-10-CM | POA: Diagnosis not present

## 2021-06-08 DIAGNOSIS — M79604 Pain in right leg: Secondary | ICD-10-CM | POA: Diagnosis not present

## 2021-06-08 DIAGNOSIS — M5459 Other low back pain: Secondary | ICD-10-CM | POA: Diagnosis not present

## 2021-06-08 DIAGNOSIS — R200 Anesthesia of skin: Secondary | ICD-10-CM | POA: Diagnosis not present

## 2021-06-22 DIAGNOSIS — L821 Other seborrheic keratosis: Secondary | ICD-10-CM | POA: Diagnosis not present

## 2021-06-22 DIAGNOSIS — D1801 Hemangioma of skin and subcutaneous tissue: Secondary | ICD-10-CM | POA: Diagnosis not present

## 2021-06-22 DIAGNOSIS — L820 Inflamed seborrheic keratosis: Secondary | ICD-10-CM | POA: Diagnosis not present

## 2021-06-23 DIAGNOSIS — Z23 Encounter for immunization: Secondary | ICD-10-CM | POA: Diagnosis not present

## 2021-06-29 DIAGNOSIS — G4733 Obstructive sleep apnea (adult) (pediatric): Secondary | ICD-10-CM | POA: Diagnosis not present

## 2021-06-30 DIAGNOSIS — G4733 Obstructive sleep apnea (adult) (pediatric): Secondary | ICD-10-CM | POA: Diagnosis not present

## 2021-06-30 DIAGNOSIS — K1370 Unspecified lesions of oral mucosa: Secondary | ICD-10-CM | POA: Diagnosis not present

## 2021-07-06 DIAGNOSIS — G629 Polyneuropathy, unspecified: Secondary | ICD-10-CM | POA: Diagnosis not present

## 2021-07-06 DIAGNOSIS — G894 Chronic pain syndrome: Secondary | ICD-10-CM | POA: Diagnosis not present

## 2021-07-06 DIAGNOSIS — M79604 Pain in right leg: Secondary | ICD-10-CM | POA: Diagnosis not present

## 2021-07-06 DIAGNOSIS — R200 Anesthesia of skin: Secondary | ICD-10-CM | POA: Diagnosis not present

## 2021-07-22 DIAGNOSIS — M25551 Pain in right hip: Secondary | ICD-10-CM | POA: Diagnosis not present

## 2021-08-03 DIAGNOSIS — M79604 Pain in right leg: Secondary | ICD-10-CM | POA: Diagnosis not present

## 2021-08-03 DIAGNOSIS — G894 Chronic pain syndrome: Secondary | ICD-10-CM | POA: Diagnosis not present

## 2021-08-03 DIAGNOSIS — G629 Polyneuropathy, unspecified: Secondary | ICD-10-CM | POA: Diagnosis not present

## 2021-08-03 DIAGNOSIS — M5459 Other low back pain: Secondary | ICD-10-CM | POA: Diagnosis not present

## 2021-08-04 DIAGNOSIS — L309 Dermatitis, unspecified: Secondary | ICD-10-CM | POA: Diagnosis not present

## 2021-08-04 DIAGNOSIS — I10 Essential (primary) hypertension: Secondary | ICD-10-CM | POA: Diagnosis not present

## 2021-08-18 DIAGNOSIS — I10 Essential (primary) hypertension: Secondary | ICD-10-CM | POA: Diagnosis not present

## 2021-09-06 DIAGNOSIS — I10 Essential (primary) hypertension: Secondary | ICD-10-CM | POA: Diagnosis not present

## 2021-09-16 DIAGNOSIS — G894 Chronic pain syndrome: Secondary | ICD-10-CM | POA: Diagnosis not present

## 2021-09-16 DIAGNOSIS — M79604 Pain in right leg: Secondary | ICD-10-CM | POA: Diagnosis not present

## 2021-09-16 DIAGNOSIS — Z79891 Long term (current) use of opiate analgesic: Secondary | ICD-10-CM | POA: Diagnosis not present

## 2021-09-16 DIAGNOSIS — R200 Anesthesia of skin: Secondary | ICD-10-CM | POA: Diagnosis not present

## 2021-09-16 DIAGNOSIS — G629 Polyneuropathy, unspecified: Secondary | ICD-10-CM | POA: Diagnosis not present

## 2021-09-16 DIAGNOSIS — Z79899 Other long term (current) drug therapy: Secondary | ICD-10-CM | POA: Diagnosis not present

## 2021-09-22 DIAGNOSIS — N401 Enlarged prostate with lower urinary tract symptoms: Secondary | ICD-10-CM | POA: Diagnosis not present

## 2021-09-22 DIAGNOSIS — R3912 Poor urinary stream: Secondary | ICD-10-CM | POA: Diagnosis not present

## 2021-09-22 DIAGNOSIS — N210 Calculus in bladder: Secondary | ICD-10-CM | POA: Diagnosis not present

## 2021-09-22 DIAGNOSIS — N200 Calculus of kidney: Secondary | ICD-10-CM | POA: Diagnosis not present

## 2021-09-24 DIAGNOSIS — H524 Presbyopia: Secondary | ICD-10-CM | POA: Diagnosis not present

## 2021-09-24 DIAGNOSIS — Z961 Presence of intraocular lens: Secondary | ICD-10-CM | POA: Diagnosis not present

## 2021-09-27 DIAGNOSIS — G459 Transient cerebral ischemic attack, unspecified: Secondary | ICD-10-CM | POA: Insufficient documentation

## 2021-09-27 DIAGNOSIS — K8020 Calculus of gallbladder without cholecystitis without obstruction: Secondary | ICD-10-CM | POA: Insufficient documentation

## 2021-09-27 DIAGNOSIS — I10 Essential (primary) hypertension: Secondary | ICD-10-CM | POA: Diagnosis not present

## 2021-09-27 DIAGNOSIS — R519 Headache, unspecified: Secondary | ICD-10-CM | POA: Insufficient documentation

## 2021-10-01 ENCOUNTER — Ambulatory Visit: Payer: Medicare Other | Admitting: Cardiology

## 2021-10-12 DIAGNOSIS — R350 Frequency of micturition: Secondary | ICD-10-CM | POA: Diagnosis not present

## 2021-10-12 DIAGNOSIS — N401 Enlarged prostate with lower urinary tract symptoms: Secondary | ICD-10-CM | POA: Diagnosis not present

## 2021-10-12 DIAGNOSIS — R3912 Poor urinary stream: Secondary | ICD-10-CM | POA: Diagnosis not present

## 2021-10-12 DIAGNOSIS — R3911 Hesitancy of micturition: Secondary | ICD-10-CM | POA: Diagnosis not present

## 2021-10-12 DIAGNOSIS — R3916 Straining to void: Secondary | ICD-10-CM | POA: Diagnosis not present

## 2021-10-19 DIAGNOSIS — G629 Polyneuropathy, unspecified: Secondary | ICD-10-CM | POA: Diagnosis not present

## 2021-10-19 DIAGNOSIS — M25559 Pain in unspecified hip: Secondary | ICD-10-CM | POA: Diagnosis not present

## 2021-10-19 DIAGNOSIS — R200 Anesthesia of skin: Secondary | ICD-10-CM | POA: Diagnosis not present

## 2021-10-19 DIAGNOSIS — G894 Chronic pain syndrome: Secondary | ICD-10-CM | POA: Diagnosis not present

## 2021-11-02 DIAGNOSIS — G250 Essential tremor: Secondary | ICD-10-CM | POA: Diagnosis not present

## 2021-11-02 DIAGNOSIS — I10 Essential (primary) hypertension: Secondary | ICD-10-CM | POA: Diagnosis not present

## 2021-11-07 DIAGNOSIS — N3001 Acute cystitis with hematuria: Secondary | ICD-10-CM | POA: Diagnosis not present

## 2021-11-14 DIAGNOSIS — N3001 Acute cystitis with hematuria: Secondary | ICD-10-CM | POA: Diagnosis not present

## 2021-11-16 DIAGNOSIS — I10 Essential (primary) hypertension: Secondary | ICD-10-CM | POA: Diagnosis not present

## 2021-11-16 DIAGNOSIS — N400 Enlarged prostate without lower urinary tract symptoms: Secondary | ICD-10-CM | POA: Diagnosis not present

## 2021-11-17 DIAGNOSIS — N3000 Acute cystitis without hematuria: Secondary | ICD-10-CM | POA: Diagnosis not present

## 2021-11-17 DIAGNOSIS — I10 Essential (primary) hypertension: Secondary | ICD-10-CM | POA: Diagnosis not present

## 2021-11-18 DIAGNOSIS — R309 Painful micturition, unspecified: Secondary | ICD-10-CM | POA: Diagnosis not present

## 2021-11-18 DIAGNOSIS — N410 Acute prostatitis: Secondary | ICD-10-CM | POA: Diagnosis not present

## 2021-11-23 DIAGNOSIS — H04123 Dry eye syndrome of bilateral lacrimal glands: Secondary | ICD-10-CM | POA: Diagnosis not present

## 2021-11-24 DIAGNOSIS — N401 Enlarged prostate with lower urinary tract symptoms: Secondary | ICD-10-CM | POA: Diagnosis not present

## 2021-11-24 DIAGNOSIS — N210 Calculus in bladder: Secondary | ICD-10-CM | POA: Diagnosis not present

## 2021-11-24 DIAGNOSIS — R351 Nocturia: Secondary | ICD-10-CM | POA: Diagnosis not present

## 2021-11-29 DIAGNOSIS — G250 Essential tremor: Secondary | ICD-10-CM | POA: Diagnosis not present

## 2021-11-29 DIAGNOSIS — I10 Essential (primary) hypertension: Secondary | ICD-10-CM | POA: Diagnosis not present

## 2021-11-29 DIAGNOSIS — R7309 Other abnormal glucose: Secondary | ICD-10-CM | POA: Diagnosis not present

## 2021-11-29 DIAGNOSIS — N400 Enlarged prostate without lower urinary tract symptoms: Secondary | ICD-10-CM | POA: Diagnosis not present

## 2021-11-29 DIAGNOSIS — L298 Other pruritus: Secondary | ICD-10-CM | POA: Diagnosis not present

## 2021-12-01 DIAGNOSIS — M25552 Pain in left hip: Secondary | ICD-10-CM | POA: Diagnosis not present

## 2021-12-01 DIAGNOSIS — Z79891 Long term (current) use of opiate analgesic: Secondary | ICD-10-CM | POA: Diagnosis not present

## 2021-12-01 DIAGNOSIS — M545 Low back pain, unspecified: Secondary | ICD-10-CM | POA: Diagnosis not present

## 2021-12-01 DIAGNOSIS — Z96642 Presence of left artificial hip joint: Secondary | ICD-10-CM | POA: Diagnosis not present

## 2021-12-01 DIAGNOSIS — G894 Chronic pain syndrome: Secondary | ICD-10-CM | POA: Diagnosis not present

## 2021-12-22 DIAGNOSIS — M25551 Pain in right hip: Secondary | ICD-10-CM | POA: Diagnosis not present

## 2021-12-22 DIAGNOSIS — M1611 Unilateral primary osteoarthritis, right hip: Secondary | ICD-10-CM | POA: Diagnosis not present

## 2021-12-27 DIAGNOSIS — L820 Inflamed seborrheic keratosis: Secondary | ICD-10-CM | POA: Diagnosis not present

## 2021-12-27 DIAGNOSIS — R600 Localized edema: Secondary | ICD-10-CM | POA: Diagnosis not present

## 2021-12-29 DIAGNOSIS — M545 Low back pain, unspecified: Secondary | ICD-10-CM | POA: Diagnosis not present

## 2021-12-29 DIAGNOSIS — Z79891 Long term (current) use of opiate analgesic: Secondary | ICD-10-CM | POA: Diagnosis not present

## 2021-12-29 DIAGNOSIS — Z96642 Presence of left artificial hip joint: Secondary | ICD-10-CM | POA: Diagnosis not present

## 2021-12-29 DIAGNOSIS — G894 Chronic pain syndrome: Secondary | ICD-10-CM | POA: Diagnosis not present

## 2021-12-29 DIAGNOSIS — M25552 Pain in left hip: Secondary | ICD-10-CM | POA: Diagnosis not present

## 2022-01-17 DIAGNOSIS — I10 Essential (primary) hypertension: Secondary | ICD-10-CM | POA: Diagnosis not present

## 2022-01-17 DIAGNOSIS — G8929 Other chronic pain: Secondary | ICD-10-CM | POA: Diagnosis not present

## 2022-01-17 DIAGNOSIS — R21 Rash and other nonspecific skin eruption: Secondary | ICD-10-CM | POA: Diagnosis not present

## 2022-01-26 DIAGNOSIS — M545 Low back pain, unspecified: Secondary | ICD-10-CM | POA: Diagnosis not present

## 2022-01-26 DIAGNOSIS — Z79891 Long term (current) use of opiate analgesic: Secondary | ICD-10-CM | POA: Diagnosis not present

## 2022-01-26 DIAGNOSIS — M25551 Pain in right hip: Secondary | ICD-10-CM | POA: Diagnosis not present

## 2022-01-26 DIAGNOSIS — G894 Chronic pain syndrome: Secondary | ICD-10-CM | POA: Diagnosis not present

## 2022-01-26 DIAGNOSIS — Z96642 Presence of left artificial hip joint: Secondary | ICD-10-CM | POA: Diagnosis not present

## 2022-02-07 DIAGNOSIS — Z23 Encounter for immunization: Secondary | ICD-10-CM | POA: Diagnosis not present

## 2022-02-08 ENCOUNTER — Encounter: Payer: Self-pay | Admitting: Podiatry

## 2022-02-08 ENCOUNTER — Ambulatory Visit (INDEPENDENT_AMBULATORY_CARE_PROVIDER_SITE_OTHER): Payer: Medicare Other | Admitting: Podiatry

## 2022-02-08 DIAGNOSIS — M79675 Pain in left toe(s): Secondary | ICD-10-CM

## 2022-02-08 DIAGNOSIS — B351 Tinea unguium: Secondary | ICD-10-CM

## 2022-02-08 DIAGNOSIS — M79674 Pain in right toe(s): Secondary | ICD-10-CM | POA: Diagnosis not present

## 2022-02-08 DIAGNOSIS — G609 Hereditary and idiopathic neuropathy, unspecified: Secondary | ICD-10-CM

## 2022-02-08 NOTE — Progress Notes (Signed)
This patient returns to the office for evaluation and treatment of long thick painful nails .  This patient is unable to trim his own nails since the patient cannot reach his feet.  Patient says the nails are painful walking and wearing his shoes.  He returns for preventive foot care services. ? ?General Appearance  Alert, conversant and in no acute stress. ? ?Vascular  Dorsalis pedis and posterior tibial  pulses are palpable  bilaterally.  Capillary return is within normal limits  bilaterally. Temperature is within normal limits  bilaterally. ? ?Neurologic  Senn-Weinstein monofilament wire test  diminished bilaterally. Muscle power within normal limits bilaterally. ? ?Nails Thick disfigured discolored nails with subungual debris  hallus nails  bilaterally. No evidence of bacterial infection or drainage bilaterally. ? ?Orthopedic  No limitations of motion  feet .  No crepitus or effusions noted.  No bony pathology or digital deformities noted. ? ?Skin  normotropic skin with no porokeratosis noted bilaterally.  No signs of infections or ulcers noted.    ? ?Onychomycosis  Pain in toes right foot  Pain in toes left foot ? ?Debridement  of nails  1-5  B/L with a nail nipper.  Nails were then filed using a dremel tool with no incidents.    RTC   prn ? ? ?Gardiner Barefoot DPM   ?

## 2022-02-18 ENCOUNTER — Ambulatory Visit (INDEPENDENT_AMBULATORY_CARE_PROVIDER_SITE_OTHER): Payer: Medicare Other | Admitting: Podiatry

## 2022-02-18 ENCOUNTER — Encounter: Payer: Self-pay | Admitting: Podiatry

## 2022-02-18 DIAGNOSIS — L600 Ingrowing nail: Secondary | ICD-10-CM | POA: Diagnosis not present

## 2022-02-18 NOTE — Patient Instructions (Signed)

## 2022-02-18 NOTE — Progress Notes (Signed)
Subjective:  ? ?Patient ID: Franklin Fisher, male   DOB: 81 y.o.   MRN: [id number]  ? ?HPI ?Patient presents stating that he has developed an ingrown toenail of his left big toe medial side and its been sore ? ? ?ROS ? ? ?   ?Objective:  ?Physical Exam  ?Neurovascular status was found to be intact muscle strength active with patient found to have incurvated medial border of the left hallux localized to that border no erythema no active drainage noted but very painful ? ?   ?Assessment:  ?Ingrown toenail deformity left hallux medial border with pain ? ?   ?Plan:  ?H&P reviewed condition and anesthetized the left hallux 60 mg like Marcaine mixture.  Sterile prep done using sterile instrumentation remove the medial border except did not exposed the matrix and I will allow it to regrow with patient understanding he may need permanent procedure at 1 point in future ?   ? ? ?

## 2022-02-21 ENCOUNTER — Ambulatory Visit: Payer: Medicare Other | Admitting: Podiatry

## 2022-02-23 ENCOUNTER — Encounter: Payer: Self-pay | Admitting: Podiatry

## 2022-02-23 ENCOUNTER — Ambulatory Visit (INDEPENDENT_AMBULATORY_CARE_PROVIDER_SITE_OTHER): Payer: Medicare Other | Admitting: Podiatry

## 2022-02-23 DIAGNOSIS — L03032 Cellulitis of left toe: Secondary | ICD-10-CM | POA: Diagnosis not present

## 2022-02-24 DIAGNOSIS — L570 Actinic keratosis: Secondary | ICD-10-CM | POA: Diagnosis not present

## 2022-02-24 DIAGNOSIS — L218 Other seborrheic dermatitis: Secondary | ICD-10-CM | POA: Diagnosis not present

## 2022-02-24 NOTE — Progress Notes (Signed)
Subjective:  ? ?Patient ID: Franklin Fisher, male   DOB: 81 y.o.   MRN: [id number]  ? ?HPI ?Patient states the corner that was taken out is doing well but the other corner has become inflamed.  States it was very sore it is starting to settle down now but it still is bothersome ? ? ?ROS ? ? ?   ?Objective:  ?Physical Exam  ?Neurovascular status intact with well-healed site where I took out the medial border of the left hallux.  The left lateral hallux has become inflamed it is localized to this area I did not note proximal edema erythema or drainage noted  ? ?   ?Assessment:  ?Well-healed nail site left hallux medial border with inflamed lateral border consistent with paronychia type infection ? ?   ?Plan:  ?H&P educated him on this and at this point I have recommended soaks and Neosporin bandage usage.  Should be uneventful and healing patient will be seen back for [place] to reevaluate if it gets worse may require removing the nail corner if it becomes symptomatic ?   ? ? ?

## 2022-02-28 DIAGNOSIS — M25551 Pain in right hip: Secondary | ICD-10-CM | POA: Diagnosis not present

## 2022-02-28 DIAGNOSIS — M545 Low back pain, unspecified: Secondary | ICD-10-CM | POA: Diagnosis not present

## 2022-02-28 DIAGNOSIS — Z79891 Long term (current) use of opiate analgesic: Secondary | ICD-10-CM | POA: Diagnosis not present

## 2022-02-28 DIAGNOSIS — G894 Chronic pain syndrome: Secondary | ICD-10-CM | POA: Diagnosis not present

## 2022-03-08 ENCOUNTER — Encounter: Payer: Self-pay | Admitting: Podiatry

## 2022-03-08 ENCOUNTER — Ambulatory Visit (INDEPENDENT_AMBULATORY_CARE_PROVIDER_SITE_OTHER): Payer: Medicare Other | Admitting: Podiatry

## 2022-03-08 DIAGNOSIS — L03032 Cellulitis of left toe: Secondary | ICD-10-CM

## 2022-03-08 MED ORDER — CEPHALEXIN 500 MG PO CAPS: 500.0000 mg | ORAL_CAPSULE | Freq: Three times a day (TID) | ORAL | 0 refills | Status: DC

## 2022-03-08 NOTE — Progress Notes (Signed)
He presents today after seeing Dr. Paulla Dolly he states that him some discoloration that I concerned about.  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 Paulla Dolly trimmed a small border out.  He states that the other side is starting to hurt now and it must of been from when Dr. Sharyon Cable trimmed the toenails nearly a month ago now.  He states that this was not hurting until just the other day.  Objective: Vital signs are stable he is alert and oriented x3.  Pulses are palpable.  The tibial border of the hallux left for Dr. Paulla Dolly worked last visit has gone on to heal uneventfully no signs or symptoms of infection whatsoever the lateral border fibular border does demonstrate some mild erythema and then dried up distal abscess at the margin of the nail.  There is some still tenderness proximal with there is no purulence or no malodor.  Assessment: Mild paronychia hallux left.  Plan: Encouraged him to soak Epsom salts and warm water apply small amount of Neosporin and I sent over Keflex 500 mg 1 p.o. 3 times daily for 10 days.  We will follow-up with him in a couple of weeks to make sure he is doing well

## 2022-03-10 DIAGNOSIS — G4733 Obstructive sleep apnea (adult) (pediatric): Secondary | ICD-10-CM | POA: Diagnosis not present

## 2022-03-11 ENCOUNTER — Other Ambulatory Visit: Payer: Self-pay | Admitting: Podiatry

## 2022-03-11 ENCOUNTER — Telehealth: Payer: Self-pay | Admitting: Podiatry

## 2022-03-11 MED ORDER — TRAMADOL HCL 50 MG PO TABS: 50.0000 mg | ORAL_TABLET | Freq: Four times a day (QID) | ORAL | 0 refills | Status: AC | PRN

## 2022-03-11 NOTE — Progress Notes (Signed)
PRN pain 

## 2022-03-11 NOTE — Telephone Encounter (Signed)
I just sent in a prescription for tramadol.  Hopefully this helps.  Thanks, Dr. Amalia Hailey

## 2022-03-11 NOTE — Telephone Encounter (Signed)
Pt called stating he seen Dr Milinda Pointer on Tuesday and is on antibiotics ( keflex 3 times a day) for an nail infection but is still having pain. He is doing the epson salt soaks as well . What should he do about the pain as it has not gotten better?

## 2022-03-15 NOTE — Telephone Encounter (Signed)
Called pt and notified him and he states he would not pick that up because he has percocet at home and the tramadol would not help it would be like taking Asprin. He then stated he was driving right now and will call back and I told him he would need to talk with the nurse.

## 2022-03-16 DIAGNOSIS — R1900 Intra-abdominal and pelvic swelling, mass and lump, unspecified site: Secondary | ICD-10-CM | POA: Diagnosis not present

## 2022-03-16 DIAGNOSIS — L089 Local infection of the skin and subcutaneous tissue, unspecified: Secondary | ICD-10-CM | POA: Diagnosis not present

## 2022-03-17 ENCOUNTER — Ambulatory Visit: Payer: Medicare Other | Admitting: Podiatry

## 2022-03-18 ENCOUNTER — Ambulatory Visit (INDEPENDENT_AMBULATORY_CARE_PROVIDER_SITE_OTHER): Payer: Medicare Other | Admitting: Podiatry

## 2022-03-18 DIAGNOSIS — L03032 Cellulitis of left toe: Secondary | ICD-10-CM

## 2022-03-21 NOTE — Progress Notes (Signed)
Subjective:   Patient ID: Franklin Fisher, male   DOB: 81 y.o.   MRN: [id number]   HPI Patient presents stating that his big toe on the lateral side has become sore and red and he has trouble wearing shoe gear with it.  States its become more of a bother for him recent   ROS      Objective:  Physical Exam  Neurovascular status intact well-healed medial border left hallux with inflammation discomfort around the lateral border with patient has been on antibiotic that has settled down some but remains painful in the corner     Assessment:  Acute paronychia infection of the left hallux lateral border     Plan:  Anesthetized the left big toe using sterile instrumentation remove the lateral border removed proud flesh abscess tissue she trimmed out the area flushed applied sterile dressing instructed on wider shoes soaks and reappoint as needed

## 2022-03-22 ENCOUNTER — Other Ambulatory Visit: Payer: Self-pay | Admitting: Family Medicine

## 2022-03-23 ENCOUNTER — Other Ambulatory Visit: Payer: Self-pay | Admitting: Family Medicine

## 2022-03-23 DIAGNOSIS — R1900 Intra-abdominal and pelvic swelling, mass and lump, unspecified site: Secondary | ICD-10-CM

## 2022-03-29 DIAGNOSIS — G894 Chronic pain syndrome: Secondary | ICD-10-CM | POA: Diagnosis not present

## 2022-03-29 DIAGNOSIS — M25551 Pain in right hip: Secondary | ICD-10-CM | POA: Diagnosis not present

## 2022-03-29 DIAGNOSIS — Z79891 Long term (current) use of opiate analgesic: Secondary | ICD-10-CM | POA: Diagnosis not present

## 2022-03-29 DIAGNOSIS — M545 Low back pain, unspecified: Secondary | ICD-10-CM | POA: Diagnosis not present

## 2022-04-03 DIAGNOSIS — L819 Disorder of pigmentation, unspecified: Secondary | ICD-10-CM | POA: Diagnosis not present

## 2022-04-04 DIAGNOSIS — G4733 Obstructive sleep apnea (adult) (pediatric): Secondary | ICD-10-CM | POA: Diagnosis not present

## 2022-04-04 DIAGNOSIS — D485 Neoplasm of uncertain behavior of skin: Secondary | ICD-10-CM | POA: Diagnosis not present

## 2022-04-04 DIAGNOSIS — L308 Other specified dermatitis: Secondary | ICD-10-CM | POA: Diagnosis not present

## 2022-04-06 ENCOUNTER — Telehealth: Payer: Self-pay

## 2022-04-06 NOTE — Telephone Encounter (Signed)
Called and spoke to patient regarding his upcoming CT scan with contrast and his allergy to the contrast. I was explaining to the patient that we would call in a 13 hr prep (prednisone and benadryl) for him to take prior to his CT scan in hopes to eliminate/minimize his reaction to the contrast. However, the patient refused to have any contrast, even with the prep. The patient stated "under no circumstance will I take the CT contrast". I explained to the patient that we would notify his ordering doctor of this. Pt verbalized understanding. Franklin Fisher, [place] scheduler at [facility], was made aware of this and will contact MD office.

## 2022-04-07 DIAGNOSIS — T148XXA Other injury of unspecified body region, initial encounter: Secondary | ICD-10-CM | POA: Diagnosis not present

## 2022-04-07 DIAGNOSIS — L089 Local infection of the skin and subcutaneous tissue, unspecified: Secondary | ICD-10-CM | POA: Diagnosis not present

## 2022-04-10 DIAGNOSIS — L231 Allergic contact dermatitis due to adhesives: Secondary | ICD-10-CM | POA: Diagnosis not present

## 2022-04-11 DIAGNOSIS — L239 Allergic contact dermatitis, unspecified cause: Secondary | ICD-10-CM | POA: Diagnosis not present

## 2022-04-15 DIAGNOSIS — R238 Other skin changes: Secondary | ICD-10-CM | POA: Diagnosis not present

## 2022-04-18 ENCOUNTER — Ambulatory Visit
Admission: RE | Admit: 2022-04-18 | Discharge: 2022-04-18 | Disposition: A | Discharge status: Home / Self Care | Payer: Medicare Other | Source: Ambulatory Visit | Attending: Family Medicine | Admitting: Family Medicine

## 2022-04-18 DIAGNOSIS — N200 Calculus of kidney: Secondary | ICD-10-CM | POA: Diagnosis not present

## 2022-04-18 DIAGNOSIS — R1900 Intra-abdominal and pelvic swelling, mass and lump, unspecified site: Secondary | ICD-10-CM

## 2022-04-21 DIAGNOSIS — L820 Inflamed seborrheic keratosis: Secondary | ICD-10-CM | POA: Diagnosis not present

## 2022-04-27 DIAGNOSIS — R5383 Other fatigue: Secondary | ICD-10-CM | POA: Diagnosis not present

## 2022-04-28 DIAGNOSIS — G894 Chronic pain syndrome: Secondary | ICD-10-CM | POA: Diagnosis not present

## 2022-04-28 DIAGNOSIS — M25551 Pain in right hip: Secondary | ICD-10-CM | POA: Diagnosis not present

## 2022-04-28 DIAGNOSIS — M545 Low back pain, unspecified: Secondary | ICD-10-CM | POA: Diagnosis not present

## 2022-04-28 DIAGNOSIS — Z79891 Long term (current) use of opiate analgesic: Secondary | ICD-10-CM | POA: Diagnosis not present

## 2022-05-10 DIAGNOSIS — M79604 Pain in right leg: Secondary | ICD-10-CM | POA: Diagnosis not present

## 2022-05-12 DIAGNOSIS — M25551 Pain in right hip: Secondary | ICD-10-CM | POA: Diagnosis not present

## 2022-05-12 DIAGNOSIS — M7918 Myalgia, other site: Secondary | ICD-10-CM | POA: Diagnosis not present

## 2022-05-12 DIAGNOSIS — M1611 Unilateral primary osteoarthritis, right hip: Secondary | ICD-10-CM | POA: Diagnosis not present

## 2022-05-13 DIAGNOSIS — N400 Enlarged prostate without lower urinary tract symptoms: Secondary | ICD-10-CM | POA: Diagnosis not present

## 2022-05-13 DIAGNOSIS — G250 Essential tremor: Secondary | ICD-10-CM | POA: Diagnosis not present

## 2022-05-13 DIAGNOSIS — I10 Essential (primary) hypertension: Secondary | ICD-10-CM | POA: Diagnosis not present

## 2022-05-13 DIAGNOSIS — R7309 Other abnormal glucose: Secondary | ICD-10-CM | POA: Diagnosis not present

## 2022-05-13 DIAGNOSIS — I700 Atherosclerosis of aorta: Secondary | ICD-10-CM | POA: Diagnosis not present

## 2022-05-13 DIAGNOSIS — I2510 Atherosclerotic heart disease of native coronary artery without angina pectoris: Secondary | ICD-10-CM | POA: Diagnosis not present

## 2022-05-13 DIAGNOSIS — L309 Dermatitis, unspecified: Secondary | ICD-10-CM | POA: Diagnosis not present

## 2022-05-13 DIAGNOSIS — G8929 Other chronic pain: Secondary | ICD-10-CM | POA: Diagnosis not present

## 2022-05-17 ENCOUNTER — Ambulatory Visit: Payer: Medicare Other | Admitting: Podiatry

## 2022-05-23 DIAGNOSIS — Z79891 Long term (current) use of opiate analgesic: Secondary | ICD-10-CM | POA: Diagnosis not present

## 2022-05-23 DIAGNOSIS — M25551 Pain in right hip: Secondary | ICD-10-CM | POA: Diagnosis not present

## 2022-05-23 DIAGNOSIS — G894 Chronic pain syndrome: Secondary | ICD-10-CM | POA: Diagnosis not present

## 2022-05-23 DIAGNOSIS — M5416 Radiculopathy, lumbar region: Secondary | ICD-10-CM | POA: Diagnosis not present

## 2022-05-23 DIAGNOSIS — M545 Low back pain, unspecified: Secondary | ICD-10-CM | POA: Diagnosis not present

## 2022-05-24 ENCOUNTER — Other Ambulatory Visit: Payer: Self-pay | Admitting: Nurse Practitioner

## 2022-05-24 DIAGNOSIS — M5416 Radiculopathy, lumbar region: Secondary | ICD-10-CM

## 2022-06-04 ENCOUNTER — Ambulatory Visit
Admission: RE | Admit: 2022-06-04 | Discharge: 2022-06-04 | Disposition: A | Discharge status: Home / Self Care | Payer: Medicare Other | Source: Ambulatory Visit | Attending: Nurse Practitioner | Admitting: Nurse Practitioner

## 2022-06-04 DIAGNOSIS — M545 Low back pain, unspecified: Secondary | ICD-10-CM | POA: Diagnosis not present

## 2022-06-04 DIAGNOSIS — M5416 Radiculopathy, lumbar region: Secondary | ICD-10-CM

## 2022-06-04 DIAGNOSIS — M4126 Other idiopathic scoliosis, lumbar region: Secondary | ICD-10-CM | POA: Diagnosis not present

## 2022-06-04 DIAGNOSIS — M48061 Spinal stenosis, lumbar region without neurogenic claudication: Secondary | ICD-10-CM | POA: Diagnosis not present

## 2022-06-04 DIAGNOSIS — M4316 Spondylolisthesis, lumbar region: Secondary | ICD-10-CM | POA: Diagnosis not present

## 2022-06-07 DIAGNOSIS — M5416 Radiculopathy, lumbar region: Secondary | ICD-10-CM | POA: Diagnosis not present

## 2022-06-07 DIAGNOSIS — M5136 Other intervertebral disc degeneration, lumbar region: Secondary | ICD-10-CM | POA: Diagnosis not present

## 2022-06-08 DIAGNOSIS — M25551 Pain in right hip: Secondary | ICD-10-CM | POA: Diagnosis not present

## 2022-06-10 ENCOUNTER — Ambulatory Visit (INDEPENDENT_AMBULATORY_CARE_PROVIDER_SITE_OTHER): Payer: Medicare Other | Admitting: Interventional Cardiology

## 2022-06-10 ENCOUNTER — Encounter: Payer: Self-pay | Admitting: Interventional Cardiology

## 2022-06-10 VITALS — BP 138/70 | HR 65 | Ht 65.0 in | Wt 210.2 lb

## 2022-06-10 DIAGNOSIS — I10 Essential (primary) hypertension: Secondary | ICD-10-CM | POA: Diagnosis not present

## 2022-06-10 DIAGNOSIS — Z79899 Other long term (current) drug therapy: Secondary | ICD-10-CM | POA: Diagnosis not present

## 2022-06-10 DIAGNOSIS — I700 Atherosclerosis of aorta: Secondary | ICD-10-CM

## 2022-06-10 DIAGNOSIS — E669 Obesity, unspecified: Secondary | ICD-10-CM

## 2022-06-10 DIAGNOSIS — E66811 Obesity, class 1: Secondary | ICD-10-CM

## 2022-06-10 MED ORDER — ROSUVASTATIN CALCIUM 10 MG PO TABS: 10.0000 mg | ORAL_TABLET | Freq: Every day | ORAL | 3 refills | Status: DC

## 2022-06-10 NOTE — Progress Notes (Signed)
Cardiology Office Note   Date:  06/10/2022   ID:  Franklin Fisher, DOB February 10, 1941, MRN [id number]  PCP:  London Pepper, MD    No chief complaint on file.  Aortic atherosclerosis  Wt Readings from Last 3 Encounters:  06/10/22 210 lb 3.2 oz (95.3 kg)  02/19/21 207 lb 6.4 oz (94.1 kg)  12/08/20 205 lb (93 kg)       History of Present Illness: Franklin Fisher is a 81 y.o. male who is being seen today for the evaluation of aortic atherosclerosis at the request of London Pepper, MD.   Echo in 2020 showed: "The left ventricle has normal systolic function with an ejection  fraction of 60-65%. The cavity size was normal. There is mild concentric  left ventricular hypertrophy. Left ventricular diastolic Doppler  parameters are consistent with impaired  relaxation.   2. The right ventricle has normal systolic function. The cavity was  normal. There is no increase in right ventricular wall thickness.   3. No evidence of mitral valve stenosis.   4. The aortic valve is tricuspid. No stenosis of the aortic valve.   5. The ascending aorta and aortic root are normal in size and structure. "  2023 Abdominal CT showed: "No abdominal aorta or iliac aneurysm. Normal severe atherosclerotic plaque of the aorta and its branches. No abdominal, pelvic, or inguinal lymphadenopathy."  He has neuropathy and arthritic back pain.  He takes percocet.  He needs a right hip replacement. He has right leg pain upon waking, relieved by standing and walking.  No pain with walking.     Denies : Chest pain. Dizziness. Leg edema. Nitroglycerin use. Orthopnea. Palpitations. Paroxysmal nocturnal dyspnea. Shortness of breath. Syncope.      Past Medical History:  Diagnosis Date   BPH (benign prostatic hyperplasia)    Gallstones    Headache    stopped at age 19   History of kidney stones    passes 1-2 per year   Hypertension 2000   Dr. 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     betamethasone dipropionate (DIPROLENE) 0.05 % ointment      clotrimazole-betamethasone (LOTRISONE) cream Apply 1 application topically daily as needed. 30 g 0   ketoconazole (NIZORAL) 2 % shampoo Apply 1 application topically daily as needed for irritation.      rosuvastatin (CRESTOR) 10 MG tablet Take 1 tablet (10 mg total) by mouth daily. 90 tablet 3   tamsulosin (FLOMAX) 0.4 MG CAPS capsule Take 0.8 mg by mouth daily.      valsartan (DIOVAN) 80 MG tablet Take 80 mg by mouth daily.  Current Facility-Administered Medications  Medication Dose Route Frequency Provider Last Rate Last Admin   diclofenac sodium (VOLTAREN) 1 % transdermal gel 2 g  2 g Topical QID Pete Pelt, PA-C        Allergies:   Ivp dye [iodinated contrast media]    Social History:  The patient  reports that he has never smoked. He has been exposed to tobacco smoke. He has never used smokeless tobacco. He reports that he does not currently use alcohol. He reports that he does not use drugs.   Family History:  The patient's family history includes Atrial fibrillation in his mother; Breast cancer in his sister; CAD in his father; Congestive Heart Failure in his mother; Heart disease in his maternal grandfather and  maternal grandmother.    ROS:  Please see the history of present illness.   Otherwise, review of systems are positive for leg pain.   All other systems are reviewed and negative.    PHYSICAL EXAM: VS:  BP 138/70 (BP Location: Left Arm, Patient Position: Sitting)   Pulse 65   Ht '5\' 5"'$  (1.651 m)   Wt 210 lb 3.2 oz (95.3 kg)   SpO2 96%   BMI 34.98 kg/m  , BMI Body mass index is 34.98 kg/m. GEN: Well nourished, well developed, in no acute distress HEENT: normal Neck: no JVD, carotid bruits, or masses Cardiac: RRR; no murmurs, rubs, or gallops,no edema  Respiratory:  clear to auscultation bilaterally, normal work of breathing GI: soft, nontender, nondistended, + BS MS: no deformity or atrophy; 2+ DP pulses Skin: warm and dry, no rash Neuro:  Strength and sensation are intact Psych: euthymic mood, full affect   EKG:   The ekg ordered today demonstrates normal ECG   Recent Labs: No results found for requested labs within last 365 days.   Lipid Panel No results found for: "CHOL", "TRIG", "HDL", "CHOLHDL", "VLDL", "LDLCALC", "LDLDIRECT"   Other studies Reviewed: Additional studies/ records that were reviewed today with results demonstrating: labs reviewed.   ASSESSMENT AND PLAN:  Aortic atherosclerosis: Start rosuvastatin 10 mg daily.  Check liver and lipids in 2 to 3 months.  We spoke about healthy diet.  Whole food, plant-based diet.  He admits to eating an unhealthy diet.  He needs regular exercise as well.  He is somewhat limited by leg pain.  Palpable pedal pulses.  I do not think his atherosclerosis in the aorta is obstructive.  He does not have symptoms of claudication.  His leg pains are more neuropathic. Obesity: He has lost weight in the past but it tends to return.  Healthy diet regular exercise. Hypertension: ARB is good choice and patient with peripheral arterial disease such as his aortic atherosclerosis.   Current medicines are reviewed at length with the patient  today.  The patient concerns regarding his medicines were addressed.  The following changes have been made: Add rosuvastatin  Labs/ tests ordered today include: lipids and liver  Orders Placed This Encounter  Procedures   Lipid Profile   Hepatic function panel   EKG 12-Lead    Recommend 150 minutes/week of aerobic exercise Low fat, low carb, high fiber diet recommended  Disposition:   FU in 1 year unless symptoms arise sooner   Signed, Larae Grooms, MD  06/10/2022 11:50 AM    [facility] [address], [place], [place]  [address] Phone: [phone]; Fax: [phone]

## 2022-06-10 NOTE — Patient Instructions (Signed)
Medication Instructions:  Your physician has recommended you make the following change in your medication: Start Rosuvastatin 10 mg by mouth daily  *If you need a refill on your cardiac medications before your next appointment, please call your pharmacy*   Lab Work: Your physician recommends that you return for lab work on August 17, 2022.  Lipid and liver profiles.  This is fasting.  The lab opens at 7:15 AM  If you have labs (blood work) drawn today and your tests are completely normal, you will receive your results only by: [place] (if you have MyChart) OR A paper copy in the mail If you have any lab test that is abnormal or we need to change your treatment, we will call you to review the results.   Testing/Procedures: none   Follow-Up: At [facility], you and your health needs are our priority.  As part of our continuing mission to provide you with exceptional heart care, we have created designated Provider Care Teams.  These Care Teams include your primary Cardiologist (physician) and Advanced Practice Providers (APPs -  Physician Assistants and Nurse Practitioners) who all work together to provide you with the care you need, when you need it.  We recommend signing up for the patient portal called "MyChart".  Sign up information is provided on this After Visit Summary.  MyChart is used to connect with patients for Virtual Visits (Telemedicine).  Patients are able to view lab/test results, encounter notes, upcoming appointments, etc.  Non-urgent messages can be sent to your provider as well.   To learn more about what you can do with MyChart, go to [email or web address].    Your next appointment:   12 month(s)  The format for your next appointment:   In Person  Provider:   Larae Grooms, MD     Other Instructions High-Fiber Eating Plan Fiber, also called dietary fiber, is a type of carbohydrate. It is found foods such as fruits, vegetables, whole grains, and  beans. A high-fiber diet can have many health benefits. Your health care provider may recommend a high-fiber diet to help: Prevent constipation. Fiber can make your bowel movements more regular. Lower your cholesterol. Relieve the following conditions: Inflammation of veins in the anus (hemorrhoids). Inflammation of specific areas of the digestive tract (uncomplicated diverticulosis). A problem of the large intestine, also called the colon, that sometimes causes pain and diarrhea (irritable bowel syndrome, or IBS). Prevent overeating as part of a weight-loss plan. Prevent heart disease, type 2 diabetes, and certain cancers. What are tips for following this plan? Reading food labels  Check the nutrition facts label on food products for the amount of dietary fiber. Choose foods that have 5 grams of fiber or more per serving. The goals for recommended daily fiber intake include: Men (age 22 or younger): 34-38 g. Men (over age 23): 28-34 g. Women (age 76 or younger): 25-28 g. Women (over age 13): 22-25 g. Your daily fiber goal is _____________ g. Shopping Choose whole fruits and vegetables instead of processed forms, such as apple juice or applesauce. Choose a wide variety of high-fiber foods such as avocados, lentils, oats, and kidney beans. Read the nutrition facts label of the foods you choose. Be aware of foods with added fiber. These foods often have high sugar and sodium amounts per serving. Cooking Use whole-grain flour for baking and cooking. Cook with brown rice instead of white rice. Meal planning Start the day with a breakfast that is high in fiber, such as  a cereal that contains 5 g of fiber or more per serving. Eat breads and cereals that are made with whole-grain flour instead of refined flour or white flour. Eat brown rice, bulgur wheat, or millet instead of white rice. Use beans in place of meat in soups, salads, and pasta dishes. Be sure that half of the grains you eat  each day are whole grains. General information You can get the recommended daily intake of dietary fiber by: Eating a variety of fruits, vegetables, grains, nuts, and beans. Taking a fiber supplement if you are not able to take in enough fiber in your diet. It is better to get fiber through food than from a supplement. Gradually increase how much fiber you consume. If you increase your intake of dietary fiber too quickly, you may have bloating, cramping, or gas. Drink plenty of water to help you digest fiber. Choose high-fiber snacks, such as berries, raw vegetables, nuts, and popcorn. What foods should I eat? Fruits Berries. Pears. Apples. Oranges. Avocado. Prunes and raisins. Dried figs. Vegetables Sweet potatoes. Spinach. Kale. Artichokes. Cabbage. Broccoli. Cauliflower. Green peas. Carrots. Squash. Grains Whole-grain breads. Multigrain cereal. Oats and oatmeal. Brown rice. Barley. Bulgur wheat. [place]. Quinoa. Bran muffins. Popcorn. Rye wafer crackers. Meats and other proteins Navy beans, kidney beans, and pinto beans. Soybeans. Split peas. Lentils. Nuts and seeds. Dairy Fiber-fortified yogurt. Beverages Fiber-fortified soy milk. Fiber-fortified orange juice. Other foods Fiber bars. The items listed above may not be a complete list of recommended foods and beverages. Contact a dietitian for more information. What foods should I avoid? Fruits Fruit juice. Cooked, strained fruit. Vegetables Fried potatoes. Canned vegetables. Well-cooked vegetables. Grains White bread. Pasta made with refined flour. White rice. Meats and other proteins Fatty cuts of meat. Fried chicken or fried fish. Dairy Milk. Yogurt. Cream cheese. Sour cream. Fats and oils Butters. Beverages Soft drinks. Other foods Cakes and pastries. The items listed above may not be a complete list of foods and beverages to avoid. Talk with your dietitian about what choices are best for you. Summary Fiber is a type  of carbohydrate. It is found in foods such as fruits, vegetables, whole grains, and beans. A high-fiber diet has many benefits. It can help to prevent constipation, lower blood cholesterol, aid weight loss, and reduce your risk of heart disease, diabetes, and certain cancers. Increase your intake of fiber gradually. Increasing fiber too quickly may cause cramping, bloating, and gas. Drink plenty of water while you increase the amount of fiber you consume. The best sources of fiber include whole fruits and vegetables, whole grains, nuts, seeds, and beans. This information is not intended to replace advice given to you by your health care provider. Make sure you discuss any questions you have with your health care provider. Document Revised: 02/06/2020 Document Reviewed: 02/06/2020 Elsevier Patient Education  [place]

## 2022-06-21 DIAGNOSIS — L723 Sebaceous cyst: Secondary | ICD-10-CM | POA: Diagnosis not present

## 2022-06-21 DIAGNOSIS — L304 Erythema intertrigo: Secondary | ICD-10-CM | POA: Diagnosis not present

## 2022-06-23 DIAGNOSIS — R300 Dysuria: Secondary | ICD-10-CM | POA: Diagnosis not present

## 2022-06-23 DIAGNOSIS — N410 Acute prostatitis: Secondary | ICD-10-CM | POA: Diagnosis not present

## 2022-06-23 DIAGNOSIS — M4726 Other spondylosis with radiculopathy, lumbar region: Secondary | ICD-10-CM | POA: Diagnosis not present

## 2022-06-23 DIAGNOSIS — G894 Chronic pain syndrome: Secondary | ICD-10-CM | POA: Diagnosis not present

## 2022-06-23 DIAGNOSIS — Z79891 Long term (current) use of opiate analgesic: Secondary | ICD-10-CM | POA: Diagnosis not present

## 2022-06-23 DIAGNOSIS — M545 Low back pain, unspecified: Secondary | ICD-10-CM | POA: Diagnosis not present

## 2022-06-30 DIAGNOSIS — L570 Actinic keratosis: Secondary | ICD-10-CM | POA: Diagnosis not present

## 2022-06-30 DIAGNOSIS — L723 Sebaceous cyst: Secondary | ICD-10-CM | POA: Diagnosis not present

## 2022-06-30 DIAGNOSIS — L218 Other seborrheic dermatitis: Secondary | ICD-10-CM | POA: Diagnosis not present

## 2022-07-13 DIAGNOSIS — Z23 Encounter for immunization: Secondary | ICD-10-CM | POA: Diagnosis not present

## 2022-07-18 ENCOUNTER — Ambulatory Visit (INDEPENDENT_AMBULATORY_CARE_PROVIDER_SITE_OTHER): Payer: Medicare Other | Admitting: Orthopaedic Surgery

## 2022-07-18 ENCOUNTER — Ambulatory Visit (INDEPENDENT_AMBULATORY_CARE_PROVIDER_SITE_OTHER): Payer: Medicare Other

## 2022-07-18 DIAGNOSIS — M25572 Pain in left ankle and joints of left foot: Secondary | ICD-10-CM

## 2022-07-18 MED ORDER — MELOXICAM 15 MG PO TABS: 15.0000 mg | ORAL_TABLET | Freq: Every day | ORAL | 1 refills | Status: DC | PRN

## 2022-07-18 MED ORDER — PREDNISONE 50 MG PO TABS: ORAL_TABLET | ORAL | 0 refills | Status: DC
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
## 2022-07-19 DIAGNOSIS — L309 Dermatitis, unspecified: Secondary | ICD-10-CM | POA: Diagnosis not present

## 2022-07-19 DIAGNOSIS — I10 Essential (primary) hypertension: Secondary | ICD-10-CM | POA: Diagnosis not present

## 2022-07-19 DIAGNOSIS — N400 Enlarged prostate without lower urinary tract symptoms: Secondary | ICD-10-CM | POA: Diagnosis not present

## 2022-07-19 DIAGNOSIS — N419 Inflammatory disease of prostate, unspecified: Secondary | ICD-10-CM | POA: Diagnosis not present

## 2022-07-21 DIAGNOSIS — M545 Low back pain, unspecified: Secondary | ICD-10-CM | POA: Diagnosis not present

## 2022-07-21 DIAGNOSIS — G894 Chronic pain syndrome: Secondary | ICD-10-CM | POA: Diagnosis not present

## 2022-07-21 DIAGNOSIS — M4726 Other spondylosis with radiculopathy, lumbar region: Secondary | ICD-10-CM | POA: Diagnosis not present

## 2022-07-21 DIAGNOSIS — Z79891 Long term (current) use of opiate analgesic: Secondary | ICD-10-CM | POA: Diagnosis not present

## 2022-08-01 DIAGNOSIS — R1900 Intra-abdominal and pelvic swelling, mass and lump, unspecified site: Secondary | ICD-10-CM | POA: Diagnosis not present

## 2022-08-01 DIAGNOSIS — L309 Dermatitis, unspecified: Secondary | ICD-10-CM | POA: Diagnosis not present

## 2022-08-01 DIAGNOSIS — R2689 Other abnormalities of gait and mobility: Secondary | ICD-10-CM | POA: Diagnosis not present

## 2022-08-08 DIAGNOSIS — L309 Dermatitis, unspecified: Secondary | ICD-10-CM | POA: Diagnosis not present

## 2022-08-08 DIAGNOSIS — Z0000 Encounter for general adult medical examination without abnormal findings: Secondary | ICD-10-CM | POA: Diagnosis not present

## 2022-08-08 DIAGNOSIS — I2510 Atherosclerotic heart disease of native coronary artery without angina pectoris: Secondary | ICD-10-CM | POA: Diagnosis not present

## 2022-08-17 ENCOUNTER — Other Ambulatory Visit: Payer: Medicare Other

## 2022-08-17 DIAGNOSIS — M1611 Unilateral primary osteoarthritis, right hip: Secondary | ICD-10-CM | POA: Diagnosis not present

## 2022-08-17 DIAGNOSIS — M25551 Pain in right hip: Secondary | ICD-10-CM | POA: Diagnosis not present

## 2022-08-17 DIAGNOSIS — R262 Difficulty in walking, not elsewhere classified: Secondary | ICD-10-CM | POA: Diagnosis not present

## 2022-08-22 ENCOUNTER — Ambulatory Visit: Payer: Medicare Other | Admitting: Orthopaedic Surgery

## 2022-08-22 DIAGNOSIS — M545 Low back pain, unspecified: Secondary | ICD-10-CM | POA: Diagnosis not present

## 2022-08-22 DIAGNOSIS — M4726 Other spondylosis with radiculopathy, lumbar region: Secondary | ICD-10-CM | POA: Diagnosis not present

## 2022-08-22 DIAGNOSIS — L309 Dermatitis, unspecified: Secondary | ICD-10-CM | POA: Diagnosis not present

## 2022-08-22 DIAGNOSIS — G894 Chronic pain syndrome: Secondary | ICD-10-CM | POA: Diagnosis not present

## 2022-08-22 DIAGNOSIS — Z79891 Long term (current) use of opiate analgesic: Secondary | ICD-10-CM | POA: Diagnosis not present

## 2022-08-22 DIAGNOSIS — L739 Follicular disorder, unspecified: Secondary | ICD-10-CM | POA: Diagnosis not present

## 2022-08-22 DIAGNOSIS — M79641 Pain in right hand: Secondary | ICD-10-CM | POA: Diagnosis not present

## 2022-08-22 DIAGNOSIS — M79642 Pain in left hand: Secondary | ICD-10-CM | POA: Diagnosis not present

## 2022-08-23 DIAGNOSIS — F4322 Adjustment disorder with anxiety: Secondary | ICD-10-CM | POA: Diagnosis not present

## 2022-08-23 DIAGNOSIS — R262 Difficulty in walking, not elsewhere classified: Secondary | ICD-10-CM | POA: Diagnosis not present

## 2022-08-23 DIAGNOSIS — M25551 Pain in right hip: Secondary | ICD-10-CM | POA: Diagnosis not present

## 2022-08-23 DIAGNOSIS — M1611 Unilateral primary osteoarthritis, right hip: Secondary | ICD-10-CM | POA: Diagnosis not present

## 2022-08-25 DIAGNOSIS — R262 Difficulty in walking, not elsewhere classified: Secondary | ICD-10-CM | POA: Diagnosis not present

## 2022-08-25 DIAGNOSIS — D485 Neoplasm of uncertain behavior of skin: Secondary | ICD-10-CM | POA: Diagnosis not present

## 2022-08-25 DIAGNOSIS — M25551 Pain in right hip: Secondary | ICD-10-CM | POA: Diagnosis not present

## 2022-08-25 DIAGNOSIS — M1611 Unilateral primary osteoarthritis, right hip: Secondary | ICD-10-CM | POA: Diagnosis not present

## 2022-08-25 DIAGNOSIS — L500 Allergic urticaria: Secondary | ICD-10-CM | POA: Diagnosis not present

## 2022-08-30 ENCOUNTER — Encounter: Payer: Self-pay | Admitting: Orthopaedic Surgery

## 2022-08-30 ENCOUNTER — Ambulatory Visit (INDEPENDENT_AMBULATORY_CARE_PROVIDER_SITE_OTHER): Payer: Medicare Other | Admitting: Orthopaedic Surgery

## 2022-08-30 DIAGNOSIS — M25531 Pain in right wrist: Secondary | ICD-10-CM

## 2022-08-30 DIAGNOSIS — M79641 Pain in right hand: Secondary | ICD-10-CM

## 2022-08-30 DIAGNOSIS — M79642 Pain in left hand: Secondary | ICD-10-CM | POA: Diagnosis not present

## 2022-08-30 MED ORDER — LIDOCAINE HCL 1 % IJ SOLN
1.0000 mL | INTRAMUSCULAR | Status: AC | PRN
  Administered 2022-08-30: 1 mL

## 2022-08-30 MED ORDER — METHYLPREDNISOLONE ACETATE 40 MG/ML IJ SUSP
40.0000 mg | INTRAMUSCULAR | Status: AC | PRN
  Administered 2022-08-30: 40 mg via INTRA_ARTICULAR

## 2022-08-30 NOTE — Progress Notes (Signed)
The patient is an 81 year old gentleman worsening and follow-up with bilateral hand pain and right wrist pain.  He is right-hand dominant.  He has x-rays from 2020 that showed widening of his SL interval on the right side and some chronic changes.  He points to his right wrist and the base of his right thumb and left thumb as a source of his pain as well as the MCP joint of his index finger.  He denies any injuries.  He denies any numbness and tingling but he says he has a harder time gripping and pinching things and the pain is becoming worse.  He does still have a lot of pain with dorsiflexion and palmar flexion of the right wrist and somewhat left wrist.  He has pain with grind test at the base of the thumb on both sides.  Both hands are well-perfused.  There is slight indenting of the thumb webspace suggesting a touch of neuropathy as well.  His fingers are well-perfused.  I would like to send him to the hand center [facility] for evaluation treatment of one of the fellowship trained hand specialist.  He says that he has had injections in the right wrist before with a steroid years ago and that helped him quite a bit.  It was prudent to place a steroid injection in the right wrist joint and the right basilar thumb joint.  He tolerated these well.  We will work on getting a referral to a hand specialist.      Procedure Note  Patient: Franklin Fisher             Date of Birth: Mar 31, 1941           MRN: [id number]             Visit Date: 08/30/2022  Procedures: Visit Diagnoses:  1. Pain in right wrist   2. Pain in right hand   3. Pain in left hand     Medium Joint Inj: R radiocarpal on 08/30/2022 8:44 AM Medications: 1 mL lidocaine 1 %; 40 mg methylPREDNISolone acetate 40 MG/ML

## 2022-08-31 ENCOUNTER — Other Ambulatory Visit: Payer: Self-pay

## 2022-08-31 ENCOUNTER — Ambulatory Visit: Payer: Medicare Other | Admitting: Orthopaedic Surgery

## 2022-08-31 DIAGNOSIS — M25531 Pain in right wrist: Secondary | ICD-10-CM

## 2022-08-31 DIAGNOSIS — M79642 Pain in left hand: Secondary | ICD-10-CM

## 2022-08-31 DIAGNOSIS — M79641 Pain in right hand: Secondary | ICD-10-CM

## 2022-09-15 DIAGNOSIS — M79642 Pain in left hand: Secondary | ICD-10-CM | POA: Diagnosis not present

## 2022-09-15 DIAGNOSIS — M79641 Pain in right hand: Secondary | ICD-10-CM | POA: Diagnosis not present

## 2022-09-16 DIAGNOSIS — Z79899 Other long term (current) drug therapy: Secondary | ICD-10-CM | POA: Diagnosis not present

## 2022-09-16 DIAGNOSIS — F439 Reaction to severe stress, unspecified: Secondary | ICD-10-CM | POA: Diagnosis not present

## 2022-09-16 DIAGNOSIS — I10 Essential (primary) hypertension: Secondary | ICD-10-CM | POA: Diagnosis not present

## 2022-09-16 DIAGNOSIS — L309 Dermatitis, unspecified: Secondary | ICD-10-CM | POA: Diagnosis not present

## 2022-09-19 DIAGNOSIS — M79641 Pain in right hand: Secondary | ICD-10-CM | POA: Diagnosis not present

## 2022-09-19 DIAGNOSIS — M545 Low back pain, unspecified: Secondary | ICD-10-CM | POA: Diagnosis not present

## 2022-09-19 DIAGNOSIS — Z79891 Long term (current) use of opiate analgesic: Secondary | ICD-10-CM | POA: Diagnosis not present

## 2022-09-19 DIAGNOSIS — M79642 Pain in left hand: Secondary | ICD-10-CM | POA: Diagnosis not present

## 2022-09-19 DIAGNOSIS — M4726 Other spondylosis with radiculopathy, lumbar region: Secondary | ICD-10-CM | POA: Diagnosis not present

## 2022-09-19 DIAGNOSIS — G894 Chronic pain syndrome: Secondary | ICD-10-CM | POA: Diagnosis not present

## 2022-09-22 DIAGNOSIS — L989 Disorder of the skin and subcutaneous tissue, unspecified: Secondary | ICD-10-CM | POA: Diagnosis not present

## 2022-09-22 DIAGNOSIS — L299 Pruritus, unspecified: Secondary | ICD-10-CM | POA: Diagnosis not present

## 2022-09-22 DIAGNOSIS — F4322 Adjustment disorder with anxiety: Secondary | ICD-10-CM | POA: Diagnosis not present

## 2022-09-23 DIAGNOSIS — L509 Urticaria, unspecified: Secondary | ICD-10-CM | POA: Diagnosis not present

## 2022-09-27 DIAGNOSIS — H26492 Other secondary cataract, left eye: Secondary | ICD-10-CM | POA: Diagnosis not present

## 2022-10-06 DIAGNOSIS — I10 Essential (primary) hypertension: Secondary | ICD-10-CM | POA: Diagnosis not present

## 2022-10-06 DIAGNOSIS — R031 Nonspecific low blood-pressure reading: Secondary | ICD-10-CM | POA: Diagnosis not present

## 2022-10-18 DIAGNOSIS — M4726 Other spondylosis with radiculopathy, lumbar region: Secondary | ICD-10-CM | POA: Diagnosis not present

## 2022-10-18 DIAGNOSIS — M545 Low back pain, unspecified: Secondary | ICD-10-CM | POA: Diagnosis not present

## 2022-10-18 DIAGNOSIS — Z79891 Long term (current) use of opiate analgesic: Secondary | ICD-10-CM | POA: Diagnosis not present

## 2022-10-18 DIAGNOSIS — G894 Chronic pain syndrome: Secondary | ICD-10-CM | POA: Diagnosis not present

## 2022-10-21 ENCOUNTER — Other Ambulatory Visit: Payer: Self-pay | Admitting: Orthopaedic Surgery

## 2022-10-24 DIAGNOSIS — H26492 Other secondary cataract, left eye: Secondary | ICD-10-CM | POA: Diagnosis not present

## 2022-10-27 DIAGNOSIS — F4322 Adjustment disorder with anxiety: Secondary | ICD-10-CM | POA: Diagnosis not present

## 2022-10-28 DIAGNOSIS — R2689 Other abnormalities of gait and mobility: Secondary | ICD-10-CM | POA: Diagnosis not present

## 2022-10-28 DIAGNOSIS — L309 Dermatitis, unspecified: Secondary | ICD-10-CM | POA: Diagnosis not present

## 2022-10-28 DIAGNOSIS — I10 Essential (primary) hypertension: Secondary | ICD-10-CM | POA: Diagnosis not present

## 2022-11-08 DIAGNOSIS — L820 Inflamed seborrheic keratosis: Secondary | ICD-10-CM | POA: Diagnosis not present

## 2022-11-08 DIAGNOSIS — L738 Other specified follicular disorders: Secondary | ICD-10-CM | POA: Diagnosis not present

## 2022-11-15 DIAGNOSIS — Z79891 Long term (current) use of opiate analgesic: Secondary | ICD-10-CM | POA: Diagnosis not present

## 2022-11-15 DIAGNOSIS — G894 Chronic pain syndrome: Secondary | ICD-10-CM | POA: Diagnosis not present

## 2022-11-15 DIAGNOSIS — M545 Low back pain, unspecified: Secondary | ICD-10-CM | POA: Diagnosis not present

## 2022-11-15 DIAGNOSIS — M79642 Pain in left hand: Secondary | ICD-10-CM | POA: Diagnosis not present

## 2022-11-15 DIAGNOSIS — M79641 Pain in right hand: Secondary | ICD-10-CM | POA: Diagnosis not present

## 2022-11-15 DIAGNOSIS — M4726 Other spondylosis with radiculopathy, lumbar region: Secondary | ICD-10-CM | POA: Diagnosis not present

## 2022-11-18 ENCOUNTER — Encounter: Payer: Self-pay | Admitting: Neurology

## 2022-11-21 ENCOUNTER — Ambulatory Visit (INDEPENDENT_AMBULATORY_CARE_PROVIDER_SITE_OTHER): Payer: Medicare Other | Admitting: Orthopaedic Surgery

## 2022-11-21 ENCOUNTER — Encounter: Payer: Self-pay | Admitting: Orthopaedic Surgery

## 2022-11-21 DIAGNOSIS — M79641 Pain in right hand: Secondary | ICD-10-CM

## 2022-11-21 DIAGNOSIS — M79642 Pain in left hand: Secondary | ICD-10-CM | POA: Diagnosis not present

## 2022-11-21 NOTE — Progress Notes (Signed)
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  I did send him to a hand specialist after injections only helped for about 2 weeks.  He did see Dr. Burney Gauze who felt that surgery was not warranted at this standpoint in terms of just watching it for now the patient is tolerating it.  He does have pain that wakes him up at night.  He is scheduled appointment to come see me but then let me know that even today someone that he is seeing who is an advocate of supplements did place some over-the-counter supplement cream on his hands and he said that has helped him quite a bit.  He does have basilar thumb joint pain bilaterally and significant limitations in range of motion of his right wrist with a known chronic scaphoid ligament tear.  He has known arthritis of the basilar thumb joints.  Right now he would like to avoid any operative treatment and consider just doing supplements unless things worsen.  I did let him know that we are planning to hire a hand specialist and if he wanted to wait for few months he should contact us again about seeing our new hand specialist when he arrives.  He agrees with this treatment plan.

## 2022-11-29 DIAGNOSIS — M25551 Pain in right hip: Secondary | ICD-10-CM | POA: Diagnosis not present

## 2022-12-02 NOTE — Progress Notes (Unsigned)
[facility] Clinic Note - Initial Visit   Date: 12/05/2022   [name] MRN: [id number] DOB: 18-Nov-1940   Dear Dr. Orland Mustard:  Thank you for your kind referral of Franklin Fisher for consultation of RLS. Although his history is well known to you, please allow [place] to reiterate it for the purpose of our medical record. The patient was accompanied to the clinic by self.    Franklin Fisher is a 82 y.o. right-handed male with BPH, hypertension, neuropathy, and lumbar spine stenosis presenting for evaluation of RLS.   IMPRESSION/PLAN: Restless leg syndrome.  He has not been on medications in the past as symptoms were mild.   - Check ferritin  - Start ropinirole 0.90m at bedtime.  Titrate as needed  2.   Idiopathic peripheral neuropathy, longstanding and stable  Return to clinic in 3 months  ------------------------------------------------------------- History of present illness: For the past several years, he has restless sensation of the entire body where he is unable to lay still to go to sleep.  He has the urge to move the limbs and if he gets up to walk, symptoms are improved.  When severe, he sleeps in the recliner to get relief.  He may wake up around 2:30a, but he can easily fall asleep again. Symptoms became worse over the past few weeks. He noticed that he started Zyrtec a few weeks ago and stopped this.   He was previously seen in October 2021 for idiopathic neuropathy manifesting with numbness and ataxia.  Symptoms have been stable.  He walks unassisted and has not had any falls.   Past Medical History:  Diagnosis Date   BPH (benign prostatic hyperplasia)    Gallstones    Headache    stopped at age 82  History of kidney stones    passes 1-2 per year   Hypertension 2000   Dr. 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   atenolol (TENORMIN) 100 MG tablet Take 50 mg by mouth daily.   ketoconazole (NIZORAL) 2 % shampoo Apply 1 application topically daily as needed for irritation.    meloxicam (MOBIC) 15 MG tablet TAKE 1 TABLET BY MOUTH DAILY AS NEEDED FOR PAIN   oxyCODONE-acetaminophen (PERCOCET) 7.5-325 MG tablet Take 1 tablet by mouth 4 (four) times daily as needed.   tamsulosin (FLOMAX) 0.4 MG CAPS capsule Take 0.8 mg by mouth daily.    predniSONE (DELTASONE) 50 MG tablet Take one tablet daily for 5 days.   rosuvastatin (CRESTOR) 10 MG tablet Take 1 tablet (10 mg total) by mouth daily.   valsartan (DIOVAN) 80 MG tablet Take  80 mg by mouth daily.   [DISCONTINUED] betamethasone dipropionate (DIPROLENE) 0.05 % ointment  (Patient not taking: Reported on 12/05/2022)   [DISCONTINUED] clotrimazole-betamethasone (LOTRISONE) cream Apply 1 application topically daily as needed. (Patient not taking: Reported on 12/05/2022)   Facility-Administered Encounter Medications as of 12/05/2022  Medication   diclofenac sodium (VOLTAREN) 1 % transdermal gel 2 g    Allergies:  Allergies  Allergen Reactions   Ivp Dye [Iodinated Contrast Media] Hives    Family History: Family History  Problem Relation Age of Onset   Breast cancer Sister        died at 84   Atrial fibrillation Mother        died 45    Congestive Heart Failure Mother    CAD Father    Heart disease Maternal Grandmother    Heart disease Maternal Grandfather     Social History: Social History   Tobacco Use   Smoking status: Never    Passive exposure: Past   Smokeless tobacco: Never  Vaping Use   Vaping Use: Never used  Substance Use Topics   Alcohol use: Not Currently   Drug use: Never   Social History   Social History Narrative   Right Handed   Lives in a apartment on the 1st floor, Elevators available   Drinks caffeine 4-5 cups a day    Vital Signs:  BP 136/81   Pulse 65   Ht 5' 5"$  (1.651 m)   Wt 210 lb (95.3 kg)   SpO2 98%   BMI 34.95 kg/m   Neurological Exam: MENTAL STATUS including orientation to time, place, person, recent and remote memory, attention span and concentration, language, and fund of knowledge is normal.  Speech is not dysarthric.  CRANIAL NERVES: II:  No visual field defects.     III-IV-VI: Pupils equal round and reactive to light.  Normal conjugate, extra-ocular eye movements in all directions of gaze.  No nystagmus.  No ptosis.   V:  Normal facial sensation.    VII:  Normal facial symmetry and movements.   VIII:  Normal hearing and vestibular function.   IX-X:  Normal palatal movement.   XI:  Normal shoulder shrug and head rotation.   XII:  Normal tongue strength and range of motion, no deviation or fasciculation.  MOTOR:  Motor strength is 5/5 throughout, including distally. No atrophy, fasciculations or abnormal movements.  No pronator drift.   MSRs:                                           Right        Left brachioradialis 2+  2+  biceps 2+  2+  triceps 2+  2+  patellar 2+  2+  ankle jerk 0  0  Hoffman no  no  plantar response down  down   SENSORY:  Vibration reduced below the ankles bilaterally.  Pin prick and temperature intact throughout.  Rhomberg sign is present.   COORDINATION/GAIT: Normal finger-to- nose-finger.  Intact rapid alternating movements  bilaterally.  Able to rise from a chair without using arms.  Gait wide-based, stable, unassisted.  He is unable to perform tandem gait.   Thank you for allowing me to participate in patient's care.  If I can answer any additional questions, I would be pleased to do so.    Sincerely,    Willman Cuny K. Posey Pronto, DO

## 2022-12-05 ENCOUNTER — Other Ambulatory Visit (INDEPENDENT_AMBULATORY_CARE_PROVIDER_SITE_OTHER): Payer: Medicare Other

## 2022-12-05 ENCOUNTER — Ambulatory Visit (INDEPENDENT_AMBULATORY_CARE_PROVIDER_SITE_OTHER): Payer: Medicare Other | Admitting: Neurology

## 2022-12-05 ENCOUNTER — Encounter: Payer: Self-pay | Admitting: Neurology

## 2022-12-05 VITALS — BP 136/81 | HR 65 | Ht 65.0 in | Wt 210.0 lb

## 2022-12-05 DIAGNOSIS — G629 Polyneuropathy, unspecified: Secondary | ICD-10-CM

## 2022-12-05 DIAGNOSIS — G2581 Restless legs syndrome: Secondary | ICD-10-CM

## 2022-12-05 DIAGNOSIS — M2550 Pain in unspecified joint: Secondary | ICD-10-CM

## 2022-12-05 LAB — FERRITIN: Ferritin: 126.9 ng/mL (ref 22.0–322.0)

## 2022-12-05 MED ORDER — ROPINIROLE HCL 0.25 MG PO TABS
ORAL_TABLET | ORAL | 2 refills | Status: DC
Start: 2022-12-05 — End: 2023-03-06

## 2022-12-05 NOTE — Patient Instructions (Signed)
Check ferritin  If needed, you can start ropinirole 0.21m at bedtime for restless leg syndrome.  If your symptoms do not improve, please let me know and we can increase the dose  Return to clinic in 3 months

## 2022-12-08 DIAGNOSIS — M25532 Pain in left wrist: Secondary | ICD-10-CM | POA: Diagnosis not present

## 2022-12-08 DIAGNOSIS — T148XXA Other injury of unspecified body region, initial encounter: Secondary | ICD-10-CM | POA: Diagnosis not present

## 2022-12-08 DIAGNOSIS — M25531 Pain in right wrist: Secondary | ICD-10-CM | POA: Diagnosis not present

## 2022-12-08 DIAGNOSIS — W19XXXA Unspecified fall, initial encounter: Secondary | ICD-10-CM | POA: Diagnosis not present

## 2022-12-14 DIAGNOSIS — R0781 Pleurodynia: Secondary | ICD-10-CM | POA: Diagnosis not present

## 2022-12-14 DIAGNOSIS — W19XXXD Unspecified fall, subsequent encounter: Secondary | ICD-10-CM | POA: Diagnosis not present

## 2022-12-21 DIAGNOSIS — M545 Low back pain, unspecified: Secondary | ICD-10-CM | POA: Diagnosis not present

## 2022-12-21 DIAGNOSIS — Z79891 Long term (current) use of opiate analgesic: Secondary | ICD-10-CM | POA: Diagnosis not present

## 2022-12-21 DIAGNOSIS — M79641 Pain in right hand: Secondary | ICD-10-CM | POA: Diagnosis not present

## 2022-12-21 DIAGNOSIS — M79642 Pain in left hand: Secondary | ICD-10-CM | POA: Diagnosis not present

## 2022-12-21 DIAGNOSIS — G894 Chronic pain syndrome: Secondary | ICD-10-CM | POA: Diagnosis not present

## 2022-12-21 DIAGNOSIS — M4726 Other spondylosis with radiculopathy, lumbar region: Secondary | ICD-10-CM | POA: Diagnosis not present

## 2022-12-27 DIAGNOSIS — Z23 Encounter for immunization: Secondary | ICD-10-CM | POA: Diagnosis not present

## 2023-01-02 ENCOUNTER — Other Ambulatory Visit: Payer: Self-pay | Admitting: Orthopaedic Surgery

## 2023-01-16 DIAGNOSIS — K5900 Constipation, unspecified: Secondary | ICD-10-CM | POA: Diagnosis not present

## 2023-01-19 DIAGNOSIS — M4726 Other spondylosis with radiculopathy, lumbar region: Secondary | ICD-10-CM | POA: Diagnosis not present

## 2023-01-19 DIAGNOSIS — M545 Low back pain, unspecified: Secondary | ICD-10-CM | POA: Diagnosis not present

## 2023-01-19 DIAGNOSIS — M79641 Pain in right hand: Secondary | ICD-10-CM | POA: Diagnosis not present

## 2023-01-19 DIAGNOSIS — K5903 Drug induced constipation: Secondary | ICD-10-CM | POA: Diagnosis not present

## 2023-01-19 DIAGNOSIS — M79642 Pain in left hand: Secondary | ICD-10-CM | POA: Diagnosis not present

## 2023-01-19 DIAGNOSIS — Z79891 Long term (current) use of opiate analgesic: Secondary | ICD-10-CM | POA: Diagnosis not present

## 2023-01-19 DIAGNOSIS — G894 Chronic pain syndrome: Secondary | ICD-10-CM | POA: Diagnosis not present

## 2023-01-26 DIAGNOSIS — K5909 Other constipation: Secondary | ICD-10-CM | POA: Diagnosis not present

## 2023-01-26 DIAGNOSIS — R5383 Other fatigue: Secondary | ICD-10-CM | POA: Diagnosis not present

## 2023-02-01 ENCOUNTER — Ambulatory Visit (INDEPENDENT_AMBULATORY_CARE_PROVIDER_SITE_OTHER): Payer: Medicare Other | Admitting: Orthopaedic Surgery

## 2023-02-01 ENCOUNTER — Encounter: Payer: Self-pay | Admitting: Orthopaedic Surgery

## 2023-02-01 DIAGNOSIS — M79642 Pain in left hand: Secondary | ICD-10-CM | POA: Diagnosis not present

## 2023-02-01 MED ORDER — METHYLPREDNISOLONE ACETATE 40 MG/ML IJ SUSP
40.0000 mg | INTRAMUSCULAR | Status: AC | PRN
  Administered 2023-02-01: 40 mg

## 2023-02-01 MED ORDER — LIDOCAINE HCL 1 % IJ SOLN
1.0000 mL | INTRAMUSCULAR | Status: AC | PRN
  Administered 2023-02-01: 1 mL

## 2023-02-01 NOTE — Progress Notes (Signed)
The patient is an 82 year old gentleman who comes in today requesting an injection around the base of his left thumb.  I have done this on his right side before and he has been still pain-free on that right side.  We had one-time send him to a hand specialist who recommended just conservative treatment for now unless his pain is getting bad enough.  He says now it is waking up at night on the left side and he still asymptomatic on the right side.  He denies any numbness and tingling.  On exam he does have some muscle atrophy in his left hand but decent pinch and grip strength.  All of his pain today seems to be around the base of the thumb.  X-rays from 2020 did not show any significant arthritic changes but there was enough.  There were 3 views of his hand.  Per his request I did provide a steroid injection around the basilar thumb joint on his left hand.  If things worsen he will let us know.  He would like to see our hand specialist later this year when he starts.    Procedure Note  Patient: Franklin Fisher             Date of Birth: 1941-06-01           MRN: [id number]             Visit Date: 02/01/2023  Procedures: Visit Diagnoses:  1. 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

## 2023-02-16 DIAGNOSIS — M79641 Pain in right hand: Secondary | ICD-10-CM | POA: Diagnosis not present

## 2023-02-16 DIAGNOSIS — M79642 Pain in left hand: Secondary | ICD-10-CM | POA: Diagnosis not present

## 2023-02-16 DIAGNOSIS — G894 Chronic pain syndrome: Secondary | ICD-10-CM | POA: Diagnosis not present

## 2023-02-16 DIAGNOSIS — M4726 Other spondylosis with radiculopathy, lumbar region: Secondary | ICD-10-CM | POA: Diagnosis not present

## 2023-02-16 DIAGNOSIS — Z79891 Long term (current) use of opiate analgesic: Secondary | ICD-10-CM | POA: Diagnosis not present

## 2023-02-16 DIAGNOSIS — M545 Low back pain, unspecified: Secondary | ICD-10-CM | POA: Diagnosis not present

## 2023-02-24 DIAGNOSIS — L304 Erythema intertrigo: Secondary | ICD-10-CM | POA: Diagnosis not present

## 2023-02-28 DIAGNOSIS — M1611 Unilateral primary osteoarthritis, right hip: Secondary | ICD-10-CM | POA: Diagnosis not present

## 2023-03-06 ENCOUNTER — Encounter: Payer: Self-pay | Admitting: Neurology

## 2023-03-06 ENCOUNTER — Ambulatory Visit (INDEPENDENT_AMBULATORY_CARE_PROVIDER_SITE_OTHER): Payer: Medicare Other | Admitting: Neurology

## 2023-03-06 VITALS — BP 127/47 | HR 66 | Ht 65.0 in | Wt 206.0 lb

## 2023-03-06 DIAGNOSIS — G629 Polyneuropathy, unspecified: Secondary | ICD-10-CM

## 2023-03-06 DIAGNOSIS — G2581 Restless legs syndrome: Secondary | ICD-10-CM | POA: Diagnosis not present

## 2023-03-06 MED ORDER — ROPINIROLE HCL 0.5 MG PO TABS: ORAL_TABLET | ORAL | 3 refills | Status: DC

## 2023-03-06 NOTE — Progress Notes (Signed)
Follow-up Visit   Date: 03/06/2023    Franklin Fisher MRN: [id number] DOB: Jan 29, 1941    Franklin Fisher is a 82 y.o. right-handed Caucasian male with BPH, hypertension, neuropathy, and lumbar spine stenosis returning to the clinic for follow-up of restless leg syndrome and neuropathy.  The patient was accompanied to the clinic by self.   IMPRESSION/PLAN:  Restless leg syndrome - Start ropinirole 0.25mg  at bedtime x 1 week, then increase to 0.5mg  daily  2.  Idiopathic peripheral neuropathy with sensory ataxia, longstanding  - Start PT for balance training  - Fall precautions discussed  Return to clinic in 4 months  --------------------------------------------- History of present illness: For the past several years, he has restless sensation of the entire body where he is unable to lay still to go to sleep.  He has the urge to move the limbs and if he gets up to walk, symptoms are improved.  When severe, he sleeps in the recliner to get relief.  He may wake up around 2:30a, but he can easily fall asleep again. Symptoms became worse over the past few weeks. He noticed that he started Zyrtec a few weeks ago and stopped this.    He was previously seen in October 2021 for idiopathic neuropathy manifesting with numbness and ataxia.  Symptoms have been stable.  He walks unassisted and has not had any falls.   UPDATE 03/06/2023:  He tried ropinirole 0.25mg  for a few days and stopped it due to no benefit.  He continues to have restless sensation and does not want to take any medication on the Beer's list (specifically, anticholinergic medications).   He has four falls in the past several months. Most recently, he climb up three flights of stairs at his daughter's graduation and by the time he got to the top, his legs gave out. He did not sustain any injuries.   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  He did not tolerate  gabapentin.  He has seen two dermatologists and is seeing a super specialist next month.    Medications:  Current Outpatient Medications on File Prior to Visit  Medication Sig Dispense Refill   albuterol (PROVENTIL HFA;VENTOLIN HFA) 108 (90 Base) MCG/ACT inhaler Inhale 2 puffs into the lungs every 6 (six) hours as needed for wheezing or shortness of breath.     atenolol (TENORMIN) 100 MG tablet Take 50 mg by mouth daily.     ketoconazole (NIZORAL) 2 % shampoo Apply 1 application topically daily as needed for irritation.      meloxicam (MOBIC) 15 MG tablet TAKE 1 TABLET BY MOUTH DAILY AS NEEDED FOR PAIN 30 tablet 1   oxyCODONE-acetaminophen (PERCOCET) 7.5-325 MG tablet Take 1 tablet by mouth 4 (four) times daily as needed.     tamsulosin (FLOMAX) 0.4 MG CAPS capsule Take 0.8 mg by mouth daily.      predniSONE (DELTASONE) 50 MG tablet Take one tablet daily for 5 days. 5 tablet 0   rosuvastatin (CRESTOR) 10 MG tablet Take 1 tablet (10 mg total) by mouth daily. 90 tablet 3   valsartan (DIOVAN) 80 MG tablet Take 80 mg by mouth daily.     Current Facility-Administered Medications on File Prior to Visit  Medication Dose Route Frequency Provider Last Rate Last Admin   diclofenac sodium (VOLTAREN) 1 % transdermal gel 2 g  2 g Topical QID Kirtland Bouchard, PA-C  Allergies:  Allergies  Allergen Reactions   Ivp Dye [Iodinated Contrast Media] Hives    Vital Signs:  BP (!) 127/47   Pulse 66   Ht 5\' 5"  (1.651 m)   Wt 206 lb (93.4 kg)   SpO2 92%   BMI 34.28 kg/m    Neurological Exam: MENTAL STATUS including orientation to time, place, person, recent and remote memory, attention span and concentration, language, and fund of knowledge is normal.  Speech is not dysarthric.  CRANIAL NERVES:   Pupils equal round and reactive to light.  Normal conjugate, extra-ocular eye movements in all directions of gaze.  No ptosis.  Face is symmetric.   MOTOR:  Motor strength is 5/5 in all extremities.   No atrophy, fasciculations or abnormal movements.  No pronator drift.  Tone is normal.    MSRs:  Reflexes are 2+/4 in the arms and absent in the legs.  SENSORY:  Absent at the knees and below.  COORDINATION/GAIT:   Gait wide-based, stable, unassisted.   Data: Lab Results  Component Value Date   FERRITIN 126.9 12/05/2022    Thank you for allowing me to participate in patient's care.  If I can answer any additional questions, I would be pleased to do so.    Sincerely,    Franklin Wafer K. Allena Katz, DO

## 2023-03-06 NOTE — Patient Instructions (Addendum)
Start physical therapy for balance  Start ropinirole 0.5mg  - half tablet for one week, then increase to 1 tablet.  Return to clinic in 4 months

## 2023-03-07 ENCOUNTER — Other Ambulatory Visit: Payer: Self-pay | Admitting: Orthopaedic Surgery

## 2023-03-15 DIAGNOSIS — M545 Low back pain, unspecified: Secondary | ICD-10-CM | POA: Diagnosis not present

## 2023-03-15 DIAGNOSIS — M79641 Pain in right hand: Secondary | ICD-10-CM | POA: Diagnosis not present

## 2023-03-15 DIAGNOSIS — M4726 Other spondylosis with radiculopathy, lumbar region: Secondary | ICD-10-CM | POA: Diagnosis not present

## 2023-03-15 DIAGNOSIS — M79642 Pain in left hand: Secondary | ICD-10-CM | POA: Diagnosis not present

## 2023-03-15 DIAGNOSIS — G894 Chronic pain syndrome: Secondary | ICD-10-CM | POA: Diagnosis not present

## 2023-03-15 DIAGNOSIS — Z79891 Long term (current) use of opiate analgesic: Secondary | ICD-10-CM | POA: Diagnosis not present

## 2023-03-15 NOTE — Therapy (Signed)
OUTPATIENT PHYSICAL THERAPY LOWER EXTREMITY EVALUATION   Patient Name: Franklin Fisher MRN: [id number] DOB:[birth year], 82 y.o., male Today's Date: 03/16/2023  END OF SESSION:  PT End of Session - 03/16/23 1132     Visit Number 1    Date for PT Re-Evaluation 05/12/23    Authorization Type Medicare/BCBS Federal    Progress Note Due on Visit 10    PT Start Time 1049    PT Stop Time 1132    PT Time Calculation (min) 43 min    Activity Tolerance Patient tolerated treatment well    Behavior During Therapy WFL for tasks assessed/performed             Past Medical History:  Diagnosis Date   BPH (benign prostatic hyperplasia)    Gallstones    Headache    stopped at age 45   History of kidney stones    passes 1-2 per year   Hypertension 2000   Dr. Kateri Plummer   Sleep apnea    CPAP   Stroke [facility])    TIA cat scans and MRI were WNL   TIA (transient ischemic attack)    Past Surgical History:  Procedure Laterality Date   CATARACT EXTRACTION, BILATERAL Bilateral 2015   CHOLECYSTECTOMY     INGUINAL HERNIA REPAIR     bi lat inguinal   TONSILLECTOMY     age 18   TOTAL HIP ARTHROPLASTY Left 04/01/2019   Procedure: LEFT TOTAL HIP ARTHROPLASTY ANTERIOR APPROACH;  Surgeon: Gean Birchwood, MD;  Location: [facility];  Service: Orthopedics;  Laterality: Left;   UMBILICAL HERNIA REPAIR     Patient Active Problem List   Diagnosis Date Noted   Gallstones 09/27/2021   Headache 09/27/2021   TIA (transient ischemic attack) 09/27/2021   Back pain 03/23/2021   Hyperglycemia 03/23/2021   Insomnia 03/23/2021   Presence of unspecified artificial hip joint 03/23/2021   Restless legs syndrome 03/23/2021   Tinnitus of left ear 03/23/2021   Chronic otitis externa of both ears 10/30/2020   Halitosis 10/30/2020   History of total hip arthroplasty, left 04/01/2019   Osteoarthritis of right hip 03/29/2019   Preop cardiovascular exam 03/28/2019   Abnormal electrocardiogram (ECG) (EKG) 03/28/2019    Essential hypertension 03/28/2019   Deviated septum 09/11/2018   Nasal turbinate hypertrophy 09/11/2018   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/11/2018   History of nephrolithiasis 04/28/2016   Acne necrotica 03/11/2016   Intertrigo 03/11/2016   Non morbid obesity due to excess calories 12/15/2015   Obstructive sleep apnea (adult) (pediatric) 07/02/2014   Umbilical hernia 09/22/2011   Diplopia 06/14/2011   Gallstone pancreatitis 04/13/2011   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   Disturbances of vision, late effect of cerebrovascular disease 09/03/2007   Asthma 06/04/2007   Benign prostatic hyperplasia 06/04/2007   Stroke (HCC) 2000    PCP: Farris Has, MD  REFERRING PROVIDER: Nita Sickle, DO  REFERRING DIAG:  Diagnosis  G62.9 (ICD-10-CM) - Neuropathy  G25.81 (ICD-10-CM) - RLS (restless legs syndrome)    THERAPY DIAG:  Other abnormalities of gait and mobility - Plan: PT plan of care cert/re-cert  Muscle weakness (generalized) - Plan: PT plan of care cert/re-cert  Rationale for Evaluation and Treatment: Rehabilitation  ONSET DATE: 4 months ago  SUBJECTIVE:   SUBJECTIVE STATEMENT: Pt presents to PT with 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  Pt reports that he has 4 falls in the past 4 months.  He uses a walker  at home for safety.  He uses a cane at times as well.   Pt would like to work on balance to improve safety. He also has restless leg syndrome and is taking medication for this.   PERTINENT HISTORY: Sleep apnea (CPAP), CVA/TIA, restless leg syndrome, 4 falls, Lt THA PAIN:  none  PRECAUTIONS: Fall  WEIGHT BEARING RESTRICTIONS: No FALLS:  Has patient fallen in last 6 months? Yes. Number of falls 4, PT will address balance  LIVING ENVIRONMENT: Lives with: lives alone Lives in: House/apartment Stairs: No Has following equipment at home: Single point cane and Environmental consultant - 2 wheeled  OCCUPATION: retired    PLOF: Independent with basic ADLs and Leisure: sedentary at home  PATIENT GOALS: reduce falls risk, improve strength, gait and balance   NEXT MD VISIT: 4 months   OBJECTIVE:   DIAGNOSTIC FINDINGS: x-ray of hip: no fracture, Lt hip is intact  COGNITION: Overall cognitive status: Within functional limits for tasks assessed    MUSCLE LENGTH: Hamstrings: limited by 25-50% bilaterally.  Not able to put on shoes and socks without aid due to limited hip mobility bilaterally  POSTURE: rounded shoulders and forward head   Single Leg stance:  Rt 3 seconds, Lt 5 seconds   LOWER EXTREMITY MMT  MMT Right eval Left eval  Hip flexion 4+ 4+  Hip extension    Hip abduction 4 4  Hip adduction    Hip internal rotation    Hip external rotation    Knee flexion 4+ 4+  Knee extension 4+ 4+  Ankle dorsiflexion 4+ 4+  Ankle plantarflexion    Ankle inversion    Ankle eversion     (Blank rows = not tested)  FUNCTIONAL TESTS:  5 times sit to stand: 10.3 seconds with uncontrolled descent and use of back of legs on chair, 18.82 seconds  Timed up and go (TUG): 10.54 without cane, instability with change of direction 3 minute walk test: 544 feet  GAIT: Distance walked: 100 feet  Assistive device utilized: Single point cane Level of assistance: Complete Independence Comments: reduced step length bilaterally, instability with change of direction   TODAY'S TREATMENT:                                                                                                                              DATE: 03/16/23  HEP established- see below  PATIENT EDUCATION:  Education details: [email or web address] Person educated: Patient Education method: Explanation, Demonstration, and Handouts Education comprehension: verbalized understanding and returned demonstration  HOME EXERCISE PROGRAM: Access Code: [email or web address] URL: [email or web address] Date: 03/16/2023 Prepared by: Tresa Endo  Exercises - Sit to  Stand with Arms Crossed  - 2-3 x daily - 7 x weekly - 1-2 sets - 10 reps - Standing Single Leg Stance with Counter Support  - 2 x daily - 7 x weekly - 1 sets - 3 reps - 15-20 hold - Seated Hamstring Stretch  - 3 x daily - 7  x weekly - 1 sets - 3 reps - 20 hold - Supine Figure 4 Piriformis Stretch  - 2 x daily - 7 x weekly - 1 sets - 3 reps - 20 hold - Heel Raises with Counter Support  - 2 x daily - 7 x weekly - 2 sets - 10 reps  ASSESSMENT:  CLINICAL IMPRESSION: Patient is a 82 y.o. male who was seen today for physical therapy evaluation and treatment for LE weakness and balance deficits related to neuropathy. Pt reports that he has had 4 falls in the past 4 months.  He utilizes the back of his legs with sit to stand and uncontrolled descent with stand to sit.  Pt performed 5x sit to stand in 18.82 seconds using good technique after cueing.  3 min walk test is 544 feet. Single leg stance stance on Lt x 3 seconds, 5 seconds on Rt.  Instability noted with change of direction with TUG.  Patient will benefit from skilled PT to address the below impairments and improve overall function.   OBJECTIVE IMPAIRMENTS: Abnormal gait, decreased activity tolerance, decreased balance, decreased endurance, difficulty walking, decreased ROM, decreased strength, impaired perceived functional ability, increased muscle spasms, impaired flexibility, improper body mechanics, postural dysfunction, and pain.   ACTIVITY LIMITATIONS: standing, squatting, stairs, transfers, bed mobility, hygiene/grooming, and locomotion level  PARTICIPATION LIMITATIONS: meal prep, laundry, shopping, and community activity  PERSONAL FACTORS: Age, Time since onset of injury/illness/exacerbation, and 1 comorbidity: bil neuropathy  are also affecting patient's functional outcome.   REHAB POTENTIAL: Good  CLINICAL DECISION MAKING: Stable/uncomplicated  EVALUATION COMPLEXITY: Low   GOALS: Goals reviewed with patient? Yes  SHORT TERM  GOALS: Target date: 04/12/2023   Be independent in initial HEP Baseline: Goal status: INITIAL  2.  Perform 5x sit to stand with good form in < or = to 14 seconds  Baseline: 18 Goal status: INITIAL  3.  Ambulate 600 feet in 3 minutes to improve endurance and safety in the community Baseline: 544 Goal status: INITIAL    LONG TERM GOALS: Target date: 05/12/23  Be independent in advanced HEP Baseline:  Goal status: INITIAL  2.  Ambulate > or = to 750 feet in 3 minutes  Baseline:  Goal status: INITIAL  3.  Perform 5x sit to stand in < or = to 13 seconds to reduce falls risk  Baseline:  Goal status: INITIAL  4.  Perform single leg stance on Rt and Lt LE x 8-10 seconds to improve stability  Baseline: 3-5 Goal status: INITIAL  5.  Report no falls at home or in community due to improved balance  Baseline:  Goal status: INITIAL  6.  Perform change of direction without instability to improve safety Baseline:  Goal status: INITIAL   PLAN:  PT FREQUENCY: 2x/week  PT DURATION: 8 weeks  PLANNED INTERVENTIONS: Therapeutic exercises, Therapeutic activity, Neuromuscular re-education, Balance training, Gait training, Patient/Family education, Self Care, Joint mobilization, Stair training, Aquatic Therapy, Dry Needling, Electrical stimulation, Cryotherapy, Moist heat, Taping, Traction, Manual therapy, and Re-evaluation  PLAN FOR NEXT SESSION: balance, work on sit to stand, gluteal activation, endurance   Lorrene Reid, PT 03/16/23 12:38 PM

## 2023-03-16 ENCOUNTER — Other Ambulatory Visit: Payer: Self-pay

## 2023-03-16 ENCOUNTER — Ambulatory Visit: Payer: Medicare Other | Attending: Neurology

## 2023-03-16 DIAGNOSIS — R2689 Other abnormalities of gait and mobility: Secondary | ICD-10-CM | POA: Insufficient documentation

## 2023-03-16 DIAGNOSIS — M6281 Muscle weakness (generalized): Secondary | ICD-10-CM | POA: Insufficient documentation

## 2023-03-16 DIAGNOSIS — G629 Polyneuropathy, unspecified: Secondary | ICD-10-CM | POA: Insufficient documentation

## 2023-03-16 DIAGNOSIS — G2581 Restless legs syndrome: Secondary | ICD-10-CM | POA: Diagnosis not present

## 2023-03-22 NOTE — Therapy (Signed)
OUTPATIENT PHYSICAL THERAPY LOWER EXTREMITY TREATMENT   Patient Name: Franklin Fisher MRN: [id number] DOB:[birth year], 82 y.o., male Today's Date: 03/23/2023  END OF SESSION:  PT End of Session - 03/23/23 1101     Visit Number 2    Date for PT Re-Evaluation 05/12/23    Authorization Type Medicare/BCBS Federal    Progress Note Due on Visit 10    PT Start Time 1102    PT Stop Time 1147    PT Time Calculation (min) 45 min    Activity Tolerance Patient tolerated treatment well    Behavior During Therapy WFL for tasks assessed/performed              Past Medical History:  Diagnosis Date   BPH (benign prostatic hyperplasia)    Gallstones    Headache    stopped at age 27   History of kidney stones    passes 1-2 per year   Hypertension 2000   Dr. Kateri Plummer   Sleep apnea    CPAP   Stroke [facility])    TIA cat scans and MRI were WNL   TIA (transient ischemic attack)    Past Surgical History:  Procedure Laterality Date   CATARACT EXTRACTION, BILATERAL Bilateral 2015   CHOLECYSTECTOMY     INGUINAL HERNIA REPAIR     bi lat inguinal   TONSILLECTOMY     age 71   TOTAL HIP ARTHROPLASTY Left 04/01/2019   Procedure: LEFT TOTAL HIP ARTHROPLASTY ANTERIOR APPROACH;  Surgeon: Gean Birchwood, MD;  Location: [facility];  Service: Orthopedics;  Laterality: Left;   UMBILICAL HERNIA REPAIR     Patient Active Problem List   Diagnosis Date Noted   Gallstones 09/27/2021   Headache 09/27/2021   TIA (transient ischemic attack) 09/27/2021   Back pain 03/23/2021   Hyperglycemia 03/23/2021   Insomnia 03/23/2021   Presence of unspecified artificial hip joint 03/23/2021   Restless legs syndrome 03/23/2021   Tinnitus of left ear 03/23/2021   Chronic otitis externa of both ears 10/30/2020   Halitosis 10/30/2020   History of total hip arthroplasty, left 04/01/2019   Osteoarthritis of right hip 03/29/2019   Preop cardiovascular exam 03/28/2019   Abnormal electrocardiogram (ECG) (EKG) 03/28/2019    Essential hypertension 03/28/2019   Deviated septum 09/11/2018   Nasal turbinate hypertrophy 09/11/2018   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/11/2018   History of nephrolithiasis 04/28/2016   Acne necrotica 03/11/2016   Intertrigo 03/11/2016   Non morbid obesity due to excess calories 12/15/2015   Obstructive sleep apnea (adult) (pediatric) 07/02/2014   Umbilical hernia 09/22/2011   Diplopia 06/14/2011   Gallstone pancreatitis 04/13/2011   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   Disturbances of vision, late effect of cerebrovascular disease 09/03/2007   Asthma 06/04/2007   Benign prostatic hyperplasia 06/04/2007   Stroke (HCC) 2000    PCP: Farris Has, MD  REFERRING PROVIDER: Nita Sickle, DO  REFERRING DIAG:  Diagnosis  G62.9 (ICD-10-CM) - Neuropathy  G25.81 (ICD-10-CM) - RLS (restless legs syndrome)    THERAPY DIAG:  Muscle weakness (generalized)  Other abnormalities of gait and mobility  Rationale for Evaluation and Treatment: Rehabilitation  ONSET DATE: 4 months ago  SUBJECTIVE:   SUBJECTIVE STATEMENT: I want to work on my flexibility  PERTINENT HISTORY: Sleep apnea (CPAP), CVA/TIA, restless leg syndrome, 4 falls, Lt THA PAIN:  none  PRECAUTIONS: Fall  WEIGHT BEARING RESTRICTIONS: No FALLS:  Has patient fallen in last 6 months? Yes. Number of falls 4,  PT will address balance  LIVING ENVIRONMENT: Lives with: lives alone Lives in: House/apartment Stairs: No Has following equipment at home: Single point cane and Environmental consultant - 2 wheeled  OCCUPATION: retired   PLOF: Independent with basic ADLs and Leisure: sedentary at home  PATIENT GOALS: reduce falls risk, improve strength, gait and balance   NEXT MD VISIT: 4 months   OBJECTIVE:   DIAGNOSTIC FINDINGS: x-ray of hip: no fracture, Lt hip is intact  COGNITION: Overall cognitive status: Within functional limits for tasks assessed    MUSCLE LENGTH: Hamstrings: limited by  25-50% bilaterally.  Not able to put on shoes and socks without aid due to limited hip mobility bilaterally  POSTURE: rounded shoulders and forward head   Single Leg stance:  Rt 3 seconds, Lt 5 seconds   LOWER EXTREMITY MMT  MMT Right eval Left eval  Hip flexion 4+ 4+  Hip extension    Hip abduction 4 4  Hip adduction    Hip internal rotation    Hip external rotation    Knee flexion 4+ 4+  Knee extension 4+ 4+  Ankle dorsiflexion 4+ 4+  Ankle plantarflexion    Ankle inversion    Ankle eversion     (Blank rows = not tested)  FUNCTIONAL TESTS:  5 times sit to stand: 10.3 seconds with uncontrolled descent and use of back of legs on chair, 18.82 seconds  Timed up and go (TUG): 10.54 without cane, instability with change of direction 3 minute walk test: 544 feet  GAIT: Distance walked: 100 feet  Assistive device utilized: Single point cane Level of assistance: Complete Independence Comments: reduced step length bilaterally, instability with change of direction   TODAY'S TREATMENT:                                                                                                                              DATE: 03/23/23  Nustep L5 x 4 min - stopped due to fatigue Seated HS stretch with gastroc stretch with strap 2x30 sec ea Sit to stand arms crossed 2 x 10 Seated hip flexor stretch 2x30 sec ea Heel Raises x 20 Standing knee flex x 10 ea Hip ABD, ext x 10 ea S/L piriformis stretch dropping leg of EOB x 1 min Bridging x 5 (limited knee ROM)   DATE: 03/16/23  HEP established- see below  PATIENT EDUCATION:  Education details: [email or web address] Person educated: Patient Education method: Explanation, Demonstration, and Handouts Education comprehension: verbalized understanding and returned demonstration  HOME EXERCISE PROGRAM: Access Code: [email or web address] URL: [email or web address] Date: 03/23/2023 Prepared by: Raynelle Fanning  Exercises - Sit to Stand with Arms Crossed  - 2-3  x daily - 7 x weekly - 1-2 sets - 10 reps - Standing Single Leg Stance with Counter Support  - 2 x daily - 7 x weekly - 1 sets - 3 reps - 15-20 hold - Seated Hamstring Stretch  - 3 x daily - 7 x weekly - 1  sets - 3 reps - 20 hold - Supine Figure 4 Piriformis Stretch  - 2 x daily - 7 x weekly - 1 sets - 3 reps - 20 hold - Heel Raises with Counter Support  - 2 x daily - 7 x weekly - 2 sets - 10 reps - Standing Hip Abduction with Counter Support  - 1 x daily - 3-4 x weekly - 1 sets - 10 reps - Standing Hip Extension with Counter Support  - 1 x daily - 3-4 x weekly - 1 sets - 10 reps - Seated Hip Flexor Stretch  - 2 x daily - 7 x weekly - 1 sets - 3 reps - 30-60 sec hold  ASSESSMENT:  CLINICAL IMPRESSION: [name] tolerated TE fairly well but is limited by right hip and left knee pain. He is not able to do the supine piriformis stretch so we tried left SDLY with leg hanging forward off the bed which he could tolerate. Good response to standing TE. [name] continues to demonstrate potential for improvement and would benefit from continued skilled therapy to address impairments.    OBJECTIVE IMPAIRMENTS: Abnormal gait, decreased activity tolerance, decreased balance, decreased endurance, difficulty walking, decreased ROM, decreased strength, impaired perceived functional ability, increased muscle spasms, impaired flexibility, improper body mechanics, postural dysfunction, and pain.   ACTIVITY LIMITATIONS: standing, squatting, stairs, transfers, bed mobility, hygiene/grooming, and locomotion level  PARTICIPATION LIMITATIONS: meal prep, laundry, shopping, and community activity  PERSONAL FACTORS: Age, Time since onset of injury/illness/exacerbation, and 1 comorbidity: bil neuropathy  are also affecting patient's functional outcome.   REHAB POTENTIAL: Good  CLINICAL DECISION MAKING: Stable/uncomplicated  EVALUATION COMPLEXITY: Low   GOALS: Goals reviewed with patient? Yes  SHORT TERM GOALS: Target  date: 04/12/2023   Be independent in initial HEP Baseline: Goal status: INITIAL  2.  Perform 5x sit to stand with good form in < or = to 14 seconds  Baseline: 18 Goal status: INITIAL  3.  Ambulate 600 feet in 3 minutes to improve endurance and safety in the community Baseline: 544 Goal status: INITIAL    LONG TERM GOALS: Target date: 05/12/23  Be independent in advanced HEP Baseline:  Goal status: INITIAL  2.  Ambulate > or = to 750 feet in 3 minutes  Baseline:  Goal status: INITIAL  3.  Perform 5x sit to stand in < or = to 13 seconds to reduce falls risk  Baseline:  Goal status: INITIAL  4.  Perform single leg stance on Rt and Lt LE x 8-10 seconds to improve stability  Baseline: 3-5 Goal status: INITIAL  5.  Report no falls at home or in community due to improved balance  Baseline:  Goal status: INITIAL  6.  Perform change of direction without instability to improve safety Baseline:  Goal status: INITIAL   PLAN:  PT FREQUENCY: 2x/week  PT DURATION: 8 weeks  PLANNED INTERVENTIONS: Therapeutic exercises, Therapeutic activity, Neuromuscular re-education, Balance training, Gait training, Patient/Family education, Self Care, Joint mobilization, Stair training, Aquatic Therapy, Dry Needling, Electrical stimulation, Cryotherapy, Moist heat, Taping, Traction, Manual therapy, and Re-evaluation  PLAN FOR NEXT SESSION: balance, work on sit to stand, gluteal activation, endurance   Solon Palm, PT 03/23/23 1:46 PM

## 2023-03-23 ENCOUNTER — Ambulatory Visit: Payer: Medicare Other | Attending: Neurology | Admitting: Physical Therapy

## 2023-03-23 ENCOUNTER — Encounter: Payer: Self-pay | Admitting: Physical Therapy

## 2023-03-23 DIAGNOSIS — R2689 Other abnormalities of gait and mobility: Secondary | ICD-10-CM | POA: Diagnosis not present

## 2023-03-23 DIAGNOSIS — M6281 Muscle weakness (generalized): Secondary | ICD-10-CM | POA: Insufficient documentation

## 2023-03-28 ENCOUNTER — Ambulatory Visit (INDEPENDENT_AMBULATORY_CARE_PROVIDER_SITE_OTHER): Payer: Medicare Other | Admitting: Nurse Practitioner

## 2023-03-28 ENCOUNTER — Encounter: Payer: Self-pay | Admitting: Nurse Practitioner

## 2023-03-28 VITALS — BP 142/74 | HR 56 | Ht 65.0 in | Wt 208.2 lb

## 2023-03-28 DIAGNOSIS — R151 Fecal smearing: Secondary | ICD-10-CM

## 2023-03-28 DIAGNOSIS — R143 Flatulence: Secondary | ICD-10-CM

## 2023-03-28 NOTE — Progress Notes (Signed)
Assessment and Plan   Primary GI: Franklin Aris, MD  Brief Narrative:  82 y.o. yo male whose past medical history includes but is not necessarily limited to BPH, obesity , HTN, OSA, kidney stones.   Recent fecal leakage, now resolved.  He may have been having overflow diarrhea due to chronic opioids/constipation.   He has slowly been weaning himself off opioids and is taking MiraLAX almost on a daily basis with improvement in bowel habits.  -He is taking meloxicam on a daily basis which we discussed.  I did tell him that this class of medications can injure the GI tract and that he should let us know if he develops any abdominal pain, nausea, vomiting or blood in stool / dark stool -Follow-up as needed   History of Present Illness   Chief complaint:  none at present.  Made this appointment in May for evaluation of stool leakage which has improved  Franklin Fisher was seen here last on 02/19/21 for evaluation of excessive gas, abdominal cramping, diarrhea  and fecal incontinence. Symptoms started after initiation of Ozempic. Please refer to that note for details.  He did not remain on Ozempic for very long but is GI symptoms persisted.  He called the office last month with a change in stool caliber and also was fecal leakage.   Interval History:  Franklin Fisher is doing much better since he made this appointment.  He has been taking MiraLAX almost on a daily basis.  Bowel habits and fecal leakage improved.  Sometimes after eating he still gets some abdominal cramping and flatulence. He is in process of weaning off percocet which he has taken for 10 years.   He also stopped anti-cholinergerics due to concern about their potential link to dementia.   Uses CPAP and it was causing a dry mouth. He hasn't been using it lately ( Sleep Medicine team told him he could stop it for a while) and dry mouth has improved.   Past Medical History:  Diagnosis Date   BPH (benign prostatic hyperplasia)    Gallstones     Headache    stopped at age 26   History of kidney stones    passes 1-2 per year   Hypertension 2000   Dr. Kateri Plummer   Sleep apnea    CPAP   Stroke [facility])    TIA cat scans and MRI were WNL   TIA (transient ischemic attack)     Past Surgical History:  Procedure Laterality Date   CATARACT EXTRACTION, BILATERAL Bilateral 2015   CHOLECYSTECTOMY     INGUINAL HERNIA REPAIR     bi lat inguinal   TONSILLECTOMY     age 40   TOTAL HIP ARTHROPLASTY Left 04/01/2019   Procedure: LEFT TOTAL HIP ARTHROPLASTY ANTERIOR APPROACH;  Surgeon: Gean Birchwood, MD;  Location: [facility];  Service: Orthopedics;  Laterality: Left;   UMBILICAL HERNIA REPAIR      Current Medications, Allergies, Family History and Social History were reviewed in [employer] record.     Current Outpatient Medications  Medication Sig Dispense Refill   albuterol (PROVENTIL HFA;VENTOLIN HFA) 108 (90 Base) MCG/ACT inhaler Inhale 2 puffs into the lungs every 6 (six) hours as needed for wheezing or shortness of breath.     atenolol (TENORMIN) 100 MG tablet Take 50 mg by mouth daily.     ketoconazole (NIZORAL) 2 % shampoo Apply 1 application topically daily as needed for irritation.      meloxicam (MOBIC) 15 MG  tablet TAKE 1 TABLET BY MOUTH DAILY AS NEEDED FOR PAIN 30 tablet 1   oxyCODONE-acetaminophen (PERCOCET) 7.5-325 MG tablet Take 1 tablet by mouth 4 (four) times daily as needed.     predniSONE (DELTASONE) 50 MG tablet Take one tablet daily for 5 days. 5 tablet 0   rOPINIRole (REQUIP) 0.5 MG tablet Take half tablet at bedtime for one week, then increase to 1 tablet at bedtime. 30 tablet 3   rosuvastatin (CRESTOR) 10 MG tablet Take 1 tablet (10 mg total) by mouth daily. 90 tablet 3   tamsulosin (FLOMAX) 0.4 MG CAPS capsule Take 0.8 mg by mouth daily.      valsartan (DIOVAN) 80 MG tablet Take 80 mg by mouth daily.     Current Facility-Administered Medications  Medication Dose Route Frequency Provider Last Rate  Last Admin   diclofenac sodium (VOLTAREN) 1 % transdermal gel 2 g  2 g Topical QID Kirtland Bouchard, PA-C        Review of Systems: No chest pain. No shortness of breath. No urinary complaints.   Physical Exam  Wt Readings from Last 3 Encounters:  03/28/23 208 lb 3.2 oz (94.4 kg)  03/06/23 206 lb (93.4 kg)  12/05/22 210 lb (95.3 kg)    Wt 208 lb 3.2 oz (94.4 kg)   BMI 34.65 kg/m  Constitutional:  Pleasant, generally well appearing male in no acute distress. Psychiatric: Normal mood and affect. Behavior is normal. EENT: Pupils normal.  Conjunctivae are normal. No scleral icterus. Neck supple.  Cardiovascular: Normal rate, regular rhythm.  Pulmonary/chest: Effort normal and breath sounds normal. No wheezing, rales or rhonchi. Abdominal: Soft, nondistended, nontender. Bowel sounds active throughout. There are no masses palpable. No hepatomegaly. Neurological: Alert and oriented to person place and time.  Skin: Skin is warm and dry. No rashes noted.  Willette Cluster, NP  03/28/2023, 1:57 PM

## 2023-03-28 NOTE — Patient Instructions (Addendum)
Can try Biotene if needed for dry mouth.   If your abdominal symptoms return please call us back. Call [phone] and ask for [facility], LPN.   Follow up as needed   _______________________________________________________  If your blood pressure at your visit was 140/90 or greater, please contact your primary care physician to follow up on this.  _______________________________________________________  If you are age 83 or older, your body mass index should be between 23-30. Your Body mass index is 34.65 kg/m. If this is out of the aforementioned range listed, please consider follow up with your Primary Care Provider.  If you are age 86 or younger, your body mass index should be between 19-25. Your Body mass index is 34.65 kg/m. If this is out of the aformentioned range listed, please consider follow up with your Primary Care Provider.   ________________________________________________________  The [facility] providers would like to encourage you to use [facility] to communicate with providers for non-urgent requests or questions.  Due to long hold times on the telephone, sending your provider a message by [facility] may be a faster and more efficient way to get a response.  Please allow 48 business hours for a response.  Please remember that this is for non-urgent requests.  _______________________________________________________   Thank you for entrusting me with your care and choosing [facility].   Willette Cluster NP

## 2023-03-29 DIAGNOSIS — R202 Paresthesia of skin: Secondary | ICD-10-CM | POA: Diagnosis not present

## 2023-03-29 DIAGNOSIS — L309 Dermatitis, unspecified: Secondary | ICD-10-CM | POA: Diagnosis not present

## 2023-03-29 DIAGNOSIS — L820 Inflamed seborrheic keratosis: Secondary | ICD-10-CM | POA: Diagnosis not present

## 2023-04-06 ENCOUNTER — Ambulatory Visit: Payer: Medicare Other

## 2023-04-06 DIAGNOSIS — M6281 Muscle weakness (generalized): Secondary | ICD-10-CM

## 2023-04-06 DIAGNOSIS — R2689 Other abnormalities of gait and mobility: Secondary | ICD-10-CM

## 2023-04-06 NOTE — Therapy (Signed)
OUTPATIENT PHYSICAL THERAPY LOWER EXTREMITY TREATMENT   Patient Name: Franklin Fisher MRN: [id number] DOB:05/26/1941, 82 y.o., male Today's Date: 04/06/2023  END OF SESSION:  PT End of Session - 04/06/23 1228     Visit Number 3    Date for PT Re-Evaluation 05/12/23    Authorization Type Medicare/BCBS Federal    Progress Note Due on Visit 10    PT Start Time 1148    PT Stop Time 1228    PT Time Calculation (min) 40 min    Activity Tolerance Patient tolerated treatment well    Behavior During Therapy WFL for tasks assessed/performed               Past Medical History:  Diagnosis Date   BPH (benign prostatic hyperplasia)    Gallstones    Headache    stopped at age 79   History of kidney stones    passes 1-2 per year   Hypertension 2000   Dr. Kateri Plummer   Sleep apnea    CPAP   Stroke [facility])    TIA cat scans and MRI were WNL   TIA (transient ischemic attack)    Past Surgical History:  Procedure Laterality Date   CATARACT EXTRACTION, BILATERAL Bilateral 2015   CHOLECYSTECTOMY     INGUINAL HERNIA REPAIR     bi lat inguinal   TONSILLECTOMY     age 65   TOTAL HIP ARTHROPLASTY Left 04/01/2019   Procedure: LEFT TOTAL HIP ARTHROPLASTY ANTERIOR APPROACH;  Surgeon: Gean Birchwood, MD;  Location: [facility];  Service: Orthopedics;  Laterality: Left;   UMBILICAL HERNIA REPAIR     Patient Active Problem List   Diagnosis Date Noted   Gallstones 09/27/2021   Headache 09/27/2021   TIA (transient ischemic attack) 09/27/2021   Back pain 03/23/2021   Hyperglycemia 03/23/2021   Insomnia 03/23/2021   Presence of unspecified artificial hip joint 03/23/2021   Restless legs syndrome 03/23/2021   Tinnitus of left ear 03/23/2021   Chronic otitis externa of both ears 10/30/2020   Halitosis 10/30/2020   History of total hip arthroplasty, left 04/01/2019   Osteoarthritis of right hip 03/29/2019   Preop cardiovascular exam 03/28/2019   Abnormal electrocardiogram (ECG) (EKG) 03/28/2019    Essential hypertension 03/28/2019   Deviated septum 09/11/2018   Nasal turbinate hypertrophy 09/11/2018   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/11/2018   History of nephrolithiasis 04/28/2016   Acne necrotica 03/11/2016   Intertrigo 03/11/2016   Non morbid obesity due to excess calories 12/15/2015   Obstructive sleep apnea (adult) (pediatric) 07/02/2014   Umbilical hernia 09/22/2011   Diplopia 06/14/2011   Gallstone pancreatitis 04/13/2011   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   Disturbances of vision, late effect of cerebrovascular disease 09/03/2007   Asthma 06/04/2007   Benign prostatic hyperplasia 06/04/2007   Stroke (HCC) 2000    PCP: Farris Has, MD  REFERRING PROVIDER: Nita Sickle, DO  REFERRING DIAG:  Diagnosis  G62.9 (ICD-10-CM) - Neuropathy  G25.81 (ICD-10-CM) - RLS (restless legs syndrome)    THERAPY DIAG:  Muscle weakness (generalized)  Other abnormalities of gait and mobility  Rationale for Evaluation and Treatment: Rehabilitation  ONSET DATE: 4 months ago  SUBJECTIVE:   SUBJECTIVE STATEMENT: I am going to be out of town for over a month to visit my daughter.    PERTINENT HISTORY: Sleep apnea (CPAP), CVA/TIA, restless leg syndrome, 4 falls, Lt THA PAIN:  none  PRECAUTIONS: Fall  WEIGHT BEARING RESTRICTIONS: No FALLS:  Has patient fallen in last 6 months? Yes. Number of falls 4, PT will address balance  LIVING ENVIRONMENT: Lives with: lives alone Lives in: House/apartment Stairs: No Has following equipment at home: Single point cane and Environmental consultant - 2 wheeled  OCCUPATION: retired   PLOF: Independent with basic ADLs and Leisure: sedentary at home  PATIENT GOALS: reduce falls risk, improve strength, gait and balance   NEXT MD VISIT: 4 months   OBJECTIVE:   DIAGNOSTIC FINDINGS: x-ray of hip: no fracture, Lt hip is intact  COGNITION: Overall cognitive status: Within functional limits for tasks  assessed    MUSCLE LENGTH: Hamstrings: limited by 25-50% bilaterally.  Not able to put on shoes and socks without aid due to limited hip mobility bilaterally  POSTURE: rounded shoulders and forward head   Single Leg stance:  Rt 3 seconds, Lt 5 seconds   LOWER EXTREMITY MMT  MMT Right eval Left eval  Hip flexion 4+ 4+  Hip extension    Hip abduction 4 4  Hip adduction    Hip internal rotation    Hip external rotation    Knee flexion 4+ 4+  Knee extension 4+ 4+  Ankle dorsiflexion 4+ 4+  Ankle plantarflexion    Ankle inversion    Ankle eversion     (Blank rows = not tested)  FUNCTIONAL TESTS:  5 times sit to stand: 10.3 seconds with uncontrolled descent and use of back of legs on chair, 18.82 seconds  Timed up and go (TUG): 10.54 without cane, instability with change of direction 3 minute walk test: 544 feet 04/06/23: 5x sit to stand: 9.19 with good control    GAIT: Distance walked: 100 feet  Assistive device utilized: Single point cane Level of assistance: Complete Independence Comments: reduced step length bilaterally, instability with change of direction   TODAY'S TREATMENT:              DATE: 04/06/23  Nustep L5 x 6 min - stopped due to fatigue Seated HS stretch with gastroc stretch with strap 2x30 sec ea Sit to stand arms crossed 2 x 10 added 5# kettlebell to 2nd set Rockerboard x3 min Heel Raises x 20 Standing knee flex x 10 ea Hip abduction and extension 2x10 Hurdles in // bars: forward and lateral with step-to over                                                                                                                DATE: 03/23/23  Nustep L5 x 4 min - stopped due to fatigue Seated HS stretch with gastroc stretch with strap 2x30 sec ea Sit to stand arms crossed 2 x 10 Seated hip flexor stretch 2x30 sec ea Heel Raises x 20 Standing knee flex x 10 ea Hip ABD, ext x 10 ea S/L piriformis stretch dropping leg of EOB x 1 min Bridging x 5 (limited knee  ROM)   DATE: 03/16/23  HEP established- see below  PATIENT EDUCATION:  Education details: [email or web address] Person educated: Patient Education method: Programmer, multimedia, Demonstration,  and Handouts Education comprehension: verbalized understanding and returned demonstration  HOME EXERCISE PROGRAM: Access Code: [email or web address] URL: [email or web address] Date: 03/23/2023 Prepared by: Raynelle Fanning  Exercises - Sit to Stand with Arms Crossed  - 2-3 x daily - 7 x weekly - 1-2 sets - 10 reps - Standing Single Leg Stance with Counter Support  - 2 x daily - 7 x weekly - 1 sets - 3 reps - 15-20 hold - Seated Hamstring Stretch  - 3 x daily - 7 x weekly - 1 sets - 3 reps - 20 hold - Supine Figure 4 Piriformis Stretch  - 2 x daily - 7 x weekly - 1 sets - 3 reps - 20 hold - Heel Raises with Counter Support  - 2 x daily - 7 x weekly - 2 sets - 10 reps - Standing Hip Abduction with Counter Support  - 1 x daily - 3-4 x weekly - 1 sets - 10 reps - Standing Hip Extension with Counter Support  - 1 x daily - 3-4 x weekly - 1 sets - 10 reps - Seated Hip Flexor Stretch  - 2 x daily - 7 x weekly - 1 sets - 3 reps - 30-60 sec hold  ASSESSMENT:  CLINICAL IMPRESSION: Pt reports that he will be out of town for 4-5 weeks and will continue HEP while he is away.  PT emphasized the importance with compliance for strength and endurance gains. Pt demonstrated improved stability and controlled technique with sit to stand and improved time from 18 seconds to 9 seconds with good technique today.  PT monitored throughout session for safety, technique and fatigue.  [name] continues to demonstrate potential for improvement and would benefit from continued skilled therapy to address impairments.    OBJECTIVE IMPAIRMENTS: Abnormal gait, decreased activity tolerance, decreased balance, decreased endurance, difficulty walking, decreased ROM, decreased strength, impaired perceived functional ability, increased muscle spasms, impaired  flexibility, improper body mechanics, postural dysfunction, and pain.   ACTIVITY LIMITATIONS: standing, squatting, stairs, transfers, bed mobility, hygiene/grooming, and locomotion level  PARTICIPATION LIMITATIONS: meal prep, laundry, shopping, and community activity  PERSONAL FACTORS: Age, Time since onset of injury/illness/exacerbation, and 1 comorbidity: bil neuropathy  are also affecting patient's functional outcome.   REHAB POTENTIAL: Good  CLINICAL DECISION MAKING: Stable/uncomplicated  EVALUATION COMPLEXITY: Low   GOALS: Goals reviewed with patient? Yes  SHORT TERM GOALS: Target date: 04/12/2023   Be independent in initial HEP Baseline:  Goal status: MET  2.  Perform 5x sit to stand with good form in < or = to 14 seconds  Baseline: 9 seconds (04/06/23) Goal status: MET  3.  Ambulate 600 feet in 3 minutes to improve endurance and safety in the community Baseline: 544 Goal status: INITIAL    LONG TERM GOALS: Target date: 05/12/23  Be independent in advanced HEP Baseline:  Goal status: INITIAL  2.  Ambulate > or = to 750 feet in 3 minutes  Baseline:  Goal status: INITIAL  3.  Perform 5x sit to stand in < or = to 13 seconds to reduce falls risk  Baseline: 9 seconds (04/06/23) Goal status: MET  4.  Perform single leg stance on Rt and Lt LE x 8-10 seconds to improve stability  Baseline: 3-5 Goal status: INITIAL  5.  Report no falls at home or in community due to improved balance  Baseline: no falls since start of care (04/06/23) Goal status: IN PROGRESS  6.  Perform change of direction without instability to improve safety Baseline:  Goal status: INITIAL   PLAN:  PT FREQUENCY: 2x/week  PT DURATION: 8 weeks  PLANNED INTERVENTIONS: Therapeutic exercises, Therapeutic activity, Neuromuscular re-education, Balance training, Gait training, Patient/Family education, Self Care, Joint mobilization, Stair training, Aquatic Therapy, Dry Needling, Electrical  stimulation, Cryotherapy, Moist heat, Taping, Traction, Manual therapy, and Re-evaluation  PLAN FOR NEXT SESSION: balance, work on sit to stand, gluteal activation, endurance.  Pt will return in 4-5 weeks.   Lorrene Reid, PT 04/06/23 12:29 PM

## 2023-04-11 ENCOUNTER — Encounter: Payer: Medicare Other | Admitting: Physical Therapy

## 2023-04-11 DIAGNOSIS — L820 Inflamed seborrheic keratosis: Secondary | ICD-10-CM | POA: Diagnosis not present

## 2023-04-11 DIAGNOSIS — L218 Other seborrheic dermatitis: Secondary | ICD-10-CM | POA: Diagnosis not present

## 2023-04-11 DIAGNOSIS — L570 Actinic keratosis: Secondary | ICD-10-CM | POA: Diagnosis not present

## 2023-04-11 DIAGNOSIS — L304 Erythema intertrigo: Secondary | ICD-10-CM | POA: Diagnosis not present

## 2023-04-14 ENCOUNTER — Ambulatory Visit: Payer: Medicare Other | Admitting: Podiatry

## 2023-04-17 ENCOUNTER — Encounter: Payer: Medicare Other | Admitting: Physical Therapy

## 2023-04-24 ENCOUNTER — Encounter: Payer: Medicare Other | Admitting: Physical Therapy

## 2023-04-26 DIAGNOSIS — M545 Low back pain, unspecified: Secondary | ICD-10-CM | POA: Diagnosis not present

## 2023-04-26 DIAGNOSIS — G988 Other disorders of nervous system: Secondary | ICD-10-CM | POA: Diagnosis not present

## 2023-04-26 DIAGNOSIS — M4726 Other spondylosis with radiculopathy, lumbar region: Secondary | ICD-10-CM | POA: Diagnosis not present

## 2023-04-26 DIAGNOSIS — G2581 Restless legs syndrome: Secondary | ICD-10-CM | POA: Diagnosis not present

## 2023-04-26 DIAGNOSIS — Z79891 Long term (current) use of opiate analgesic: Secondary | ICD-10-CM | POA: Diagnosis not present

## 2023-04-26 DIAGNOSIS — G894 Chronic pain syndrome: Secondary | ICD-10-CM | POA: Diagnosis not present

## 2023-04-26 DIAGNOSIS — L299 Pruritus, unspecified: Secondary | ICD-10-CM | POA: Diagnosis not present

## 2023-04-27 ENCOUNTER — Other Ambulatory Visit: Payer: Self-pay

## 2023-04-27 ENCOUNTER — Ambulatory Visit (INDEPENDENT_AMBULATORY_CARE_PROVIDER_SITE_OTHER): Payer: Medicare Other | Admitting: Orthopaedic Surgery

## 2023-04-27 ENCOUNTER — Encounter: Payer: Self-pay | Admitting: Orthopaedic Surgery

## 2023-04-27 DIAGNOSIS — M79641 Pain in right hand: Secondary | ICD-10-CM | POA: Diagnosis not present

## 2023-04-27 DIAGNOSIS — M79642 Pain in left hand: Secondary | ICD-10-CM

## 2023-04-27 NOTE — Progress Notes (Signed)
HPI: Mr. Bauserman comes in today with complaint of not on both hands that are very painful.  He denies any numbness tingling in either hand.  He states he has pain in both hands that it awakens him at night.  The left pain is worse than the right.  He is right-handed.  He states the pain is severe.  States that the knots have became smaller but he continues to have pain.  He is seeing Dr. Magnus Ivan in the past for hand pain and arthritis.  Review of systems: See HPI otherwise negative  Physical exam: General well-developed well-nourished male no acute distress.  Mood and affect appropriate. Psych: Alert and oriented x 3 Bilateral hands no rashes skin lesions ulcerations.  There is no palpable nodule dorsal aspect of either hand.  He has pain with even supination of the left forearm.  Unable to tolerate grind test today in either hand due to pain.  Tenderness over the snuffbox area left hand only.  Sensation subjectively intact bilateral hands.  Radiographs: Left hand 3 views: No acute fractures.  CMC joint arthritic changes.  Widening of the scaphoid lunate.  Radiocarpal arthritic changes. Right hand: No acute fractures or acute findings.  Radiocarpal arthritic changes.  Scaphoid lunate widening.  CMC joint arthritic changes.  Impression: Bilateral wrist arthritis  Plan: Dr. Magnus Ivan evaluated patient and feels that he would be served best by the hand surgeon.  Will have him see our new hand surgeon who will be joining the practice in the next few weeks.  Questions were encouraged and answered

## 2023-05-02 ENCOUNTER — Ambulatory Visit (INDEPENDENT_AMBULATORY_CARE_PROVIDER_SITE_OTHER): Payer: Medicare Other | Admitting: Podiatry

## 2023-05-02 DIAGNOSIS — L600 Ingrowing nail: Secondary | ICD-10-CM

## 2023-05-02 NOTE — Progress Notes (Signed)
Subjective: Chief Complaint  Patient presents with   Ingrown Toenail    Patient came in today for left hallux possible ingrown toenail, started 2 weeks ago, no drainage,    82 year old male presents the office for above concerns.  He states left big toenail may become ingrown.  His girlfriend does trim the nails.  He has some soreness in the corners.  No drainage or pus.   Objective: AAO x3, NAD DP/PT pulses palpable bilaterally, CRT less than 3 seconds Observation both the medial and lateral aspects of the left hallux toenail.  There is some slight edema there is no erythema, drainage or pus or any signs of infection.  Mild translocation the distal portion of the nail borders. No pain with calf compression, swelling, warmth, erythema  Assessment: Ingrown toenail  Plan: -All treatment options discussed with the patient including all alternatives, risks, complications.  -We discussed partial nail avulsion versus debridement/conservative treatment.  He is not to proceed with partial nail avulsion. Sharply debrided the nail with any complications or bleeding.  Monitor for any signs or symptoms of infection but should the symptoms persist may need to proceed with partial nail avulsion. -Patient encouraged to call the office with any questions, concerns, change in symptoms.   Vivi Barrack DPM

## 2023-05-02 NOTE — Patient Instructions (Signed)
Soak Instructions- HAVE SOMEONE ELSE CHECK THE TEMPERATURE OF THE WATER    THE DAY AFTER THE PROCEDURE  Place 1/4 cup of epsom salts in a quart of warm tap water.  Submerge your foot or feet with outer bandage intact for the initial soak; this will allow the bandage to become moist and wet for easy lift off.  Once you remove your bandage, continue to soak in the solution for 20 minutes.  This soak should be done twice a day.  Next, remove your foot or feet from solution, blot dry the affected area and cover.  You may use a band aid large enough to cover the area or use gauze and tape.  Apply other medications to the area as directed by the doctor such as polysporin neosporin.  IF YOUR SKIN BECOMES IRRITATED WHILE USING THESE INSTRUCTIONS, IT IS OKAY TO SWITCH TO  WHITE VINEGAR AND WATER. Or you may use antibacterial soap and water to keep the toe clean  Monitor for any signs/symptoms of infection. Call the office immediately if any occur or go directly to the emergency room. Call with any questions/concerns.

## 2023-05-03 ENCOUNTER — Ambulatory Visit: Payer: Medicare Other | Admitting: Orthopaedic Surgery

## 2023-05-09 DIAGNOSIS — R208 Other disturbances of skin sensation: Secondary | ICD-10-CM | POA: Diagnosis not present

## 2023-05-09 DIAGNOSIS — L298 Other pruritus: Secondary | ICD-10-CM | POA: Diagnosis not present

## 2023-05-09 DIAGNOSIS — L245 Irritant contact dermatitis due to other chemical products: Secondary | ICD-10-CM | POA: Diagnosis not present

## 2023-05-19 DIAGNOSIS — R300 Dysuria: Secondary | ICD-10-CM | POA: Diagnosis not present

## 2023-05-22 DIAGNOSIS — M5412 Radiculopathy, cervical region: Secondary | ICD-10-CM | POA: Diagnosis not present

## 2023-05-27 DIAGNOSIS — R39198 Other difficulties with micturition: Secondary | ICD-10-CM | POA: Diagnosis not present

## 2023-05-27 DIAGNOSIS — N419 Inflammatory disease of prostate, unspecified: Secondary | ICD-10-CM | POA: Diagnosis not present

## 2023-05-29 ENCOUNTER — Ambulatory Visit (INDEPENDENT_AMBULATORY_CARE_PROVIDER_SITE_OTHER): Payer: Medicare Other | Admitting: Orthopedic Surgery

## 2023-05-29 DIAGNOSIS — M1812 Unilateral primary osteoarthritis of first carpometacarpal joint, left hand: Secondary | ICD-10-CM | POA: Diagnosis not present

## 2023-05-29 DIAGNOSIS — M180 Bilateral primary osteoarthritis of first carpometacarpal joints: Secondary | ICD-10-CM

## 2023-05-29 MED ORDER — LIDOCAINE HCL 1 % IJ SOLN
1.0000 mL | INTRAMUSCULAR | Status: AC | PRN
  Administered 2023-05-29: 1 mL

## 2023-05-29 MED ORDER — BETAMETHASONE SOD PHOS & ACET 6 (3-3) MG/ML IJ SUSP
6.0000 mg | INTRAMUSCULAR | Status: AC | PRN
  Administered 2023-05-29: 6 mg via INTRA_ARTICULAR

## 2023-05-29 NOTE — Progress Notes (Signed)
Franklin Fisher - 82 y.o. male MRN [id number]  Date of birth: Oct 26, 1940  Office Visit Note: Visit Date: 05/29/2023 PCP: Farris Has, MD Referred by: Farris Has, MD  Subjective: Chief Complaint  Patient presents with   Right Hand - Pain   Left Hand - Pain   HPI: Franklin Fisher is a pleasant 82 y.o. male who presents today for evaluation for bilateral basilar thumb pain that has been present for multiple years, worsening in nature.  He has been seen by Dr. Magnus Ivan in the past and has undergone prior injections bilaterally x1.  Estimates his most recent injection is April of this year.  Localizes pain primarily to the left thumb basal joint at this point.  Denies any significant numbness or tingling.  Has not trialed bracing.  Visit Reason: bilateral hand  Hand dominance: right Occupation: Retired Diabetic: No Heart/Lung History: none Blood Thinners: none  Prior Testing/EMG: xray 04/27/23 Injections (Date): 02/01/23 Treatments: injection  Prior Surgery: none    *right hand improvement after injection, left not so much* L>R pain wise  Denies numbness/tingling   Pertinent ROS were reviewed with the patient and found to be negative unless otherwise specified above in HPI.   Assessment & Plan: Visit Diagnoses: No diagnosis found.  Plan: Extensive discussion was had the patient today regarding his bilateral thumb CMC osteoarthritis.  I reviewed the results of his previous diagnostic imaging which is consistent with degenerative change at the thumb basilar joint with osteophyte formation, subchondral sclerosis and joint subluxation.  We discussed treatment options ranging from conservative to surgical.  From a conservative standpoint, he would like to trial bracing particular for the left thumb as he has not done this in the past.  He is interested in a repeat injection as well.  We did discuss potential surgical intervention in the future in the form of left thumb [facility]  arthroplasty with internal brace suspensioplasty.  Risks and benefits of the procedure were discussed, risks including but not limited to infection, bleeding, scarring, stiffness, nerve injury, vascular injury, hardware complication, recurrence of symptoms and need for subsequent operation.  Patient expressed understanding.    For the time being, we will move forward with injection for the left thumb basilar joint for symptom relief.  Comfort Cool brace fitted today.  Patient will return as needed moving forward for potential surgical intervention in the future.    Follow-up: No follow-ups on file.   Meds & Orders: No orders of the defined types were placed in this encounter.  No orders of the defined types were placed in this encounter.    Procedures: Hand/UE Inj: L thumb CMC for osteoarthritis on 05/29/2023 1:27 PM Details: 25 G needle Medications: 1 mL lidocaine 1 %; 6 mg betamethasone acetate-betamethasone sodium phosphate 6 (3-3) MG/ML Consent was given by the patient.          Clinical History: No specialty comments available.  He reports that he has never smoked. He has been exposed to tobacco smoke. He has never used smokeless tobacco. No results for input(s): "HGBA1C", "LABURIC" in the last 8760 hours.  Objective:   Vital Signs: There were no vitals taken for this visit.  Physical Exam  Gen: Well-appearing, in no acute distress; non-toxic CV: Regular Rate. Well-perfused. Warm.  Resp: Breathing unlabored on room air; no wheezing. Psych: Fluid speech in conversation; appropriate affect; normal thought process Neuro: Sensation intact throughout. No gross coordination deficits.   Ortho Exam General: Patient is well appearing  and in no distress. Cervical spine mobility is full in all directions:   Skin and Muscle: No skin changes are apparent to upper extremities.  Muscle bulk and contour normal, no signs of atrophy.      Range of Motion and Palpation Tests: Mobility is  full about the elbows with flexion and extension.  Forearm supination and pronation are 85/85 bilaterally.  Wrist flexion/extension is 75/65 bilaterally.  Digital flexion and extension are full.  Thumb opposition is full to the base of the small fingers bilaterally.     No cords or nodules are palpated.  No triggering is observed.     Significant tenderness over the bilateral thumb CMC articulation is observed, positive grind, positive crepitus.  MP hyperextension negative.    Finklestein test positive left   Neurologic, Vascular, Motor: Sensation is intact to light touch in the median/radial/ulnar distributions.  Tinel's testing negative at wrist level. Phalen's negative, Derkan's compression negative.  Fingers pink and well perfused.  Capillary refill is brisk.     Imaging: No results found.  Past Medical/Family/Surgical/Social History: Medications & Allergies reviewed per EMR, new medications updated. Patient Active Problem List   Diagnosis Date Noted   Gallstones 09/27/2021   Headache 09/27/2021   TIA (transient ischemic attack) 09/27/2021   Back pain 03/23/2021   Hyperglycemia 03/23/2021   Insomnia 03/23/2021   Presence of unspecified artificial hip joint 03/23/2021   Restless legs syndrome 03/23/2021   Tinnitus of left ear 03/23/2021   Chronic otitis externa of both ears 10/30/2020   Halitosis 10/30/2020   History of total hip arthroplasty, left 04/01/2019   Osteoarthritis of right hip 03/29/2019   Preop cardiovascular exam 03/28/2019   Abnormal electrocardiogram (ECG) (EKG) 03/28/2019   Essential hypertension 03/28/2019   Deviated septum 09/11/2018   Nasal turbinate hypertrophy 09/11/2018   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/11/2018   History of nephrolithiasis 04/28/2016   Acne necrotica 03/11/2016   Intertrigo 03/11/2016   Non morbid obesity due to excess calories 12/15/2015   Obstructive sleep apnea (adult) (pediatric) 07/02/2014   Umbilical  hernia 09/22/2011   Diplopia 06/14/2011   Gallstone pancreatitis 04/13/2011   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 Kateri Plummer   Sleep apnea    CPAP   Stroke [facility])    TIA cat scans and MRI were WNL   TIA (transient ischemic attack)    Family History  Problem Relation Age of Onset   Breast cancer Sister        died at 47   Atrial fibrillation Mother        died 11   Congestive Heart Failure Mother    CAD Father    Heart disease Maternal Grandmother    Heart disease Maternal Grandfather    Past Surgical History:  Procedure Laterality Date   CATARACT EXTRACTION, BILATERAL Bilateral 2015   CHOLECYSTECTOMY     INGUINAL HERNIA REPAIR     bi lat inguinal   TONSILLECTOMY     age 19   TOTAL HIP ARTHROPLASTY Left 04/01/2019   Procedure: LEFT  TOTAL HIP ARTHROPLASTY ANTERIOR APPROACH;  Surgeon: Gean Birchwood, MD;  Location: [facility];  Service: Orthopedics;  Laterality: Left;   UMBILICAL HERNIA REPAIR     Social History   Occupational History   Occupation: retired  Tobacco Use   Smoking status: Never    Passive exposure: Past   Smokeless tobacco: Never  Vaping Use   Vaping status: Never Used  Substance and Sexual Activity   Alcohol use: Not Currently   Drug use: Never   Sexual activity: Not on file     Fara Boros) Denese Killings, M.D. [facility] 1:22 PM

## 2023-06-05 ENCOUNTER — Encounter: Payer: Self-pay | Admitting: Podiatry

## 2023-06-05 ENCOUNTER — Ambulatory Visit (INDEPENDENT_AMBULATORY_CARE_PROVIDER_SITE_OTHER): Payer: Medicare Other | Admitting: Podiatry

## 2023-06-05 DIAGNOSIS — B351 Tinea unguium: Secondary | ICD-10-CM

## 2023-06-05 DIAGNOSIS — M542 Cervicalgia: Secondary | ICD-10-CM | POA: Diagnosis not present

## 2023-06-05 DIAGNOSIS — M79675 Pain in left toe(s): Secondary | ICD-10-CM

## 2023-06-05 DIAGNOSIS — M79674 Pain in right toe(s): Secondary | ICD-10-CM

## 2023-06-05 DIAGNOSIS — M5412 Radiculopathy, cervical region: Secondary | ICD-10-CM | POA: Diagnosis not present

## 2023-06-05 NOTE — Progress Notes (Unsigned)
Subjective: Chief Complaint  Patient presents with   Nail Problem    Nail trim     82 year old male presents the office for above concerns.  He states that his nails are dystrophic and walking he is not able to trim them himself and are causing discomfort.  No swelling redness or drainage.    He currently does not take anything for neuropathy   Objective: AAO x3, NAD DP/PT pulses palpable bilaterally, CRT less than 3 seconds Nails are hypertrophic, dystrophic, brittle, discolored, elongated 10. No surrounding redness or drainage. Tenderness nails 1-5 bilaterally. No open lesions or pre-ulcerative lesions are identified today. Dried blood right medial nail border, just noticed 1 month ago.  No signs of hyper mentation of the surrounding skin.  No edema, erythema. No pain with calf compression, swelling, warmth, erythema  Assessment: Symptomatic onychomycosis   Plan: -All treatment options discussed with the patient including all alternatives, risks, complications.  -Separate debrided nails x 10 without any complications or bleeding.  Monitor the right hallux toenail as there is likely dried blood present underneath the medial toenail that should grow out over time.  There is no crepitus there is possible biopsy.  Monitor for any signs or symptoms of infection.  No follow-ups on file.  Vivi Barrack DPM

## 2023-06-06 DIAGNOSIS — L235 Allergic contact dermatitis due to other chemical products: Secondary | ICD-10-CM | POA: Diagnosis not present

## 2023-06-06 DIAGNOSIS — L738 Other specified follicular disorders: Secondary | ICD-10-CM | POA: Diagnosis not present

## 2023-06-07 ENCOUNTER — Other Ambulatory Visit: Payer: Self-pay | Admitting: Orthopedic Surgery

## 2023-06-07 DIAGNOSIS — M542 Cervicalgia: Secondary | ICD-10-CM

## 2023-06-08 DIAGNOSIS — Z79891 Long term (current) use of opiate analgesic: Secondary | ICD-10-CM | POA: Diagnosis not present

## 2023-06-08 DIAGNOSIS — M545 Low back pain, unspecified: Secondary | ICD-10-CM | POA: Diagnosis not present

## 2023-06-08 DIAGNOSIS — G894 Chronic pain syndrome: Secondary | ICD-10-CM | POA: Diagnosis not present

## 2023-06-08 DIAGNOSIS — M4726 Other spondylosis with radiculopathy, lumbar region: Secondary | ICD-10-CM | POA: Diagnosis not present

## 2023-06-17 ENCOUNTER — Ambulatory Visit: Admission: RE | Admit: 2023-06-17 | Payer: Medicare Other | Source: Ambulatory Visit

## 2023-06-17 DIAGNOSIS — Z23 Encounter for immunization: Secondary | ICD-10-CM | POA: Diagnosis not present

## 2023-06-17 DIAGNOSIS — M542 Cervicalgia: Secondary | ICD-10-CM | POA: Diagnosis not present

## 2023-06-22 DIAGNOSIS — I700 Atherosclerosis of aorta: Secondary | ICD-10-CM | POA: Diagnosis not present

## 2023-06-22 DIAGNOSIS — L309 Dermatitis, unspecified: Secondary | ICD-10-CM | POA: Diagnosis not present

## 2023-06-22 DIAGNOSIS — L299 Pruritus, unspecified: Secondary | ICD-10-CM | POA: Diagnosis not present

## 2023-06-26 ENCOUNTER — Ambulatory Visit: Payer: Medicare Other | Admitting: Podiatry

## 2023-06-26 DIAGNOSIS — M542 Cervicalgia: Secondary | ICD-10-CM | POA: Diagnosis not present

## 2023-06-28 ENCOUNTER — Other Ambulatory Visit: Payer: Self-pay | Admitting: Family Medicine

## 2023-06-28 DIAGNOSIS — R591 Generalized enlarged lymph nodes: Secondary | ICD-10-CM | POA: Diagnosis not present

## 2023-06-30 ENCOUNTER — Ambulatory Visit
Admission: RE | Admit: 2023-06-30 | Discharge: 2023-06-30 | Disposition: A | Discharge status: Home / Self Care | Payer: Medicare Other | Source: Ambulatory Visit | Attending: Family Medicine | Admitting: Family Medicine

## 2023-06-30 DIAGNOSIS — R591 Generalized enlarged lymph nodes: Secondary | ICD-10-CM

## 2023-06-30 DIAGNOSIS — R590 Localized enlarged lymph nodes: Secondary | ICD-10-CM | POA: Diagnosis not present

## 2023-07-04 DIAGNOSIS — M25551 Pain in right hip: Secondary | ICD-10-CM | POA: Diagnosis not present

## 2023-07-06 DIAGNOSIS — M4726 Other spondylosis with radiculopathy, lumbar region: Secondary | ICD-10-CM | POA: Diagnosis not present

## 2023-07-06 DIAGNOSIS — G894 Chronic pain syndrome: Secondary | ICD-10-CM | POA: Diagnosis not present

## 2023-07-06 DIAGNOSIS — Z79891 Long term (current) use of opiate analgesic: Secondary | ICD-10-CM | POA: Diagnosis not present

## 2023-07-06 DIAGNOSIS — M545 Low back pain, unspecified: Secondary | ICD-10-CM | POA: Diagnosis not present

## 2023-07-11 ENCOUNTER — Telehealth: Payer: Self-pay

## 2023-07-11 NOTE — Telephone Encounter (Signed)
error 

## 2023-07-13 DIAGNOSIS — L820 Inflamed seborrheic keratosis: Secondary | ICD-10-CM | POA: Diagnosis not present

## 2023-07-18 ENCOUNTER — Ambulatory Visit: Payer: Medicare Other | Admitting: Neurology

## 2023-07-24 DIAGNOSIS — J343 Hypertrophy of nasal turbinates: Secondary | ICD-10-CM | POA: Diagnosis not present

## 2023-07-24 DIAGNOSIS — J300 Vasomotor rhinitis: Secondary | ICD-10-CM | POA: Diagnosis not present

## 2023-07-24 DIAGNOSIS — G629 Polyneuropathy, unspecified: Secondary | ICD-10-CM | POA: Diagnosis not present

## 2023-07-24 DIAGNOSIS — M7989 Other specified soft tissue disorders: Secondary | ICD-10-CM | POA: Diagnosis not present

## 2023-07-24 DIAGNOSIS — G988 Other disorders of nervous system: Secondary | ICD-10-CM | POA: Diagnosis not present

## 2023-07-24 DIAGNOSIS — L299 Pruritus, unspecified: Secondary | ICD-10-CM | POA: Diagnosis not present

## 2023-07-26 DIAGNOSIS — J3089 Other allergic rhinitis: Secondary | ICD-10-CM | POA: Diagnosis not present

## 2023-07-26 DIAGNOSIS — J300 Vasomotor rhinitis: Secondary | ICD-10-CM | POA: Diagnosis not present

## 2023-07-26 DIAGNOSIS — R21 Rash and other nonspecific skin eruption: Secondary | ICD-10-CM | POA: Diagnosis not present

## 2023-08-01 DIAGNOSIS — L304 Erythema intertrigo: Secondary | ICD-10-CM | POA: Diagnosis not present

## 2023-08-03 DIAGNOSIS — M1611 Unilateral primary osteoarthritis, right hip: Secondary | ICD-10-CM | POA: Diagnosis not present

## 2023-08-03 DIAGNOSIS — G894 Chronic pain syndrome: Secondary | ICD-10-CM | POA: Diagnosis not present

## 2023-08-03 DIAGNOSIS — M4726 Other spondylosis with radiculopathy, lumbar region: Secondary | ICD-10-CM | POA: Diagnosis not present

## 2023-08-03 DIAGNOSIS — M545 Low back pain, unspecified: Secondary | ICD-10-CM | POA: Diagnosis not present

## 2023-08-03 DIAGNOSIS — Z79891 Long term (current) use of opiate analgesic: Secondary | ICD-10-CM | POA: Diagnosis not present

## 2023-08-24 DIAGNOSIS — Z0000 Encounter for general adult medical examination without abnormal findings: Secondary | ICD-10-CM | POA: Diagnosis not present

## 2023-08-24 DIAGNOSIS — R7309 Other abnormal glucose: Secondary | ICD-10-CM | POA: Diagnosis not present

## 2023-08-24 DIAGNOSIS — L309 Dermatitis, unspecified: Secondary | ICD-10-CM | POA: Diagnosis not present

## 2023-08-24 DIAGNOSIS — I2510 Atherosclerotic heart disease of native coronary artery without angina pectoris: Secondary | ICD-10-CM | POA: Diagnosis not present

## 2023-08-26 DIAGNOSIS — R197 Diarrhea, unspecified: Secondary | ICD-10-CM | POA: Diagnosis not present

## 2023-08-31 DIAGNOSIS — M4726 Other spondylosis with radiculopathy, lumbar region: Secondary | ICD-10-CM | POA: Diagnosis not present

## 2023-08-31 DIAGNOSIS — M1611 Unilateral primary osteoarthritis, right hip: Secondary | ICD-10-CM | POA: Diagnosis not present

## 2023-08-31 DIAGNOSIS — G894 Chronic pain syndrome: Secondary | ICD-10-CM | POA: Diagnosis not present

## 2023-08-31 DIAGNOSIS — Z79891 Long term (current) use of opiate analgesic: Secondary | ICD-10-CM | POA: Diagnosis not present

## 2023-08-31 DIAGNOSIS — M545 Low back pain, unspecified: Secondary | ICD-10-CM | POA: Diagnosis not present

## 2023-09-08 DIAGNOSIS — L708 Other acne: Secondary | ICD-10-CM | POA: Diagnosis not present

## 2023-09-08 DIAGNOSIS — L570 Actinic keratosis: Secondary | ICD-10-CM | POA: Diagnosis not present

## 2023-09-08 DIAGNOSIS — L309 Dermatitis, unspecified: Secondary | ICD-10-CM | POA: Diagnosis not present

## 2023-10-03 DIAGNOSIS — M4726 Other spondylosis with radiculopathy, lumbar region: Secondary | ICD-10-CM | POA: Diagnosis not present

## 2023-10-03 DIAGNOSIS — G894 Chronic pain syndrome: Secondary | ICD-10-CM | POA: Diagnosis not present

## 2023-10-03 DIAGNOSIS — Z79891 Long term (current) use of opiate analgesic: Secondary | ICD-10-CM | POA: Diagnosis not present

## 2023-10-03 DIAGNOSIS — M545 Low back pain, unspecified: Secondary | ICD-10-CM | POA: Diagnosis not present

## 2023-10-03 DIAGNOSIS — M1611 Unilateral primary osteoarthritis, right hip: Secondary | ICD-10-CM | POA: Diagnosis not present

## 2023-10-05 DIAGNOSIS — L309 Dermatitis, unspecified: Secondary | ICD-10-CM | POA: Diagnosis not present

## 2023-10-05 DIAGNOSIS — L738 Other specified follicular disorders: Secondary | ICD-10-CM | POA: Diagnosis not present

## 2023-10-05 DIAGNOSIS — B356 Tinea cruris: Secondary | ICD-10-CM | POA: Diagnosis not present

## 2023-10-31 DIAGNOSIS — M1611 Unilateral primary osteoarthritis, right hip: Secondary | ICD-10-CM | POA: Diagnosis not present

## 2023-11-01 DIAGNOSIS — M545 Low back pain, unspecified: Secondary | ICD-10-CM | POA: Diagnosis not present

## 2023-11-01 DIAGNOSIS — Z79891 Long term (current) use of opiate analgesic: Secondary | ICD-10-CM | POA: Diagnosis not present

## 2023-11-01 DIAGNOSIS — G894 Chronic pain syndrome: Secondary | ICD-10-CM | POA: Diagnosis not present

## 2023-11-01 DIAGNOSIS — N411 Chronic prostatitis: Secondary | ICD-10-CM | POA: Diagnosis not present

## 2023-11-01 DIAGNOSIS — R399 Unspecified symptoms and signs involving the genitourinary system: Secondary | ICD-10-CM | POA: Diagnosis not present

## 2023-11-01 DIAGNOSIS — M1611 Unilateral primary osteoarthritis, right hip: Secondary | ICD-10-CM | POA: Diagnosis not present

## 2023-11-01 DIAGNOSIS — M4726 Other spondylosis with radiculopathy, lumbar region: Secondary | ICD-10-CM | POA: Diagnosis not present

## 2023-11-06 DIAGNOSIS — M1611 Unilateral primary osteoarthritis, right hip: Secondary | ICD-10-CM | POA: Diagnosis not present

## 2023-11-16 DIAGNOSIS — G479 Sleep disorder, unspecified: Secondary | ICD-10-CM | POA: Diagnosis not present

## 2023-11-16 DIAGNOSIS — L309 Dermatitis, unspecified: Secondary | ICD-10-CM | POA: Diagnosis not present

## 2023-11-16 DIAGNOSIS — Z6836 Body mass index (BMI) 36.0-36.9, adult: Secondary | ICD-10-CM | POA: Diagnosis not present

## 2023-11-28 DIAGNOSIS — M25551 Pain in right hip: Secondary | ICD-10-CM | POA: Diagnosis not present

## 2023-11-29 DIAGNOSIS — M4726 Other spondylosis with radiculopathy, lumbar region: Secondary | ICD-10-CM | POA: Diagnosis not present

## 2023-11-29 DIAGNOSIS — G894 Chronic pain syndrome: Secondary | ICD-10-CM | POA: Diagnosis not present

## 2023-11-29 DIAGNOSIS — M545 Low back pain, unspecified: Secondary | ICD-10-CM | POA: Diagnosis not present

## 2023-11-29 DIAGNOSIS — Z79891 Long term (current) use of opiate analgesic: Secondary | ICD-10-CM | POA: Diagnosis not present

## 2023-11-29 DIAGNOSIS — M1611 Unilateral primary osteoarthritis, right hip: Secondary | ICD-10-CM | POA: Diagnosis not present

## 2023-12-18 DIAGNOSIS — Z23 Encounter for immunization: Secondary | ICD-10-CM | POA: Diagnosis not present

## 2023-12-18 DIAGNOSIS — Z961 Presence of intraocular lens: Secondary | ICD-10-CM | POA: Diagnosis not present

## 2023-12-18 DIAGNOSIS — H04123 Dry eye syndrome of bilateral lacrimal glands: Secondary | ICD-10-CM | POA: Diagnosis not present

## 2023-12-20 DIAGNOSIS — L299 Pruritus, unspecified: Secondary | ICD-10-CM | POA: Diagnosis not present

## 2023-12-20 DIAGNOSIS — G4733 Obstructive sleep apnea (adult) (pediatric): Secondary | ICD-10-CM | POA: Diagnosis not present

## 2023-12-25 ENCOUNTER — Encounter: Payer: Self-pay | Admitting: Physician Assistant

## 2023-12-25 ENCOUNTER — Ambulatory Visit (INDEPENDENT_AMBULATORY_CARE_PROVIDER_SITE_OTHER): Admitting: Physician Assistant

## 2023-12-25 DIAGNOSIS — M65322 Trigger finger, left index finger: Secondary | ICD-10-CM | POA: Diagnosis not present

## 2023-12-25 DIAGNOSIS — M65311 Trigger thumb, right thumb: Secondary | ICD-10-CM

## 2023-12-25 MED ORDER — LIDOCAINE HCL 1 % IJ SOLN
0.5000 mL | INTRAMUSCULAR | Status: AC | PRN
  Administered 2023-12-25: .5 mL

## 2023-12-25 MED ORDER — LIDOCAINE HCL 1 % IJ SOLN
0.5000 mL | INTRAMUSCULAR | Status: AC | PRN
Start: 2023-12-25 — End: 2023-12-25
  Administered 2023-12-25: .5 mL

## 2023-12-25 MED ORDER — METHYLPREDNISOLONE ACETATE 40 MG/ML IJ SUSP
20.0000 mg | INTRAMUSCULAR | Status: AC | PRN
  Administered 2023-12-25: 20 mg

## 2023-12-25 NOTE — Progress Notes (Signed)
   Procedure Note  Patient: Franklin Fisher             Date of Birth: [birth year]           MRN: [id number]             Visit Date: 12/25/2023 HPI patient comes in today due to left middle finger pain that this started yesterday locks.  He also notes that he has had right thumb pain and locking catching sensation.  He has had this in the right hand for and had a prior injection that worked well.  He has had no known injury.  He is asking for injections for these trigger fingers today.  Review of systems: Negative for fevers chills.  Bilateral hands: Active triggering left long finger.  Palpable nodule at the A1 pulley region of the left long finger.  Right hand active triggering right thumb.  Painful nodule at the A1 pulley region.  No rashes skin lesions ulcerations throughout the hand.  Procedures: Visit Diagnoses:  1. Trigger thumb, right thumb   2. Trigger index finger of left hand     Hand/UE Inj: R thumb A1 for trigger finger on 12/25/2023 4:52 PM Medications: 0.5 mL lidocaine 1 %; 20 mg methylPREDNISolone acetate 40 MG/ML Consent was given by the patient. Immediately prior to procedure a time out was called to verify the correct patient, procedure, equipment, support staff and site/side marked as required. Patient was prepped and draped in the usual sterile fashion.    Hand/UE Inj: L index A1 for trigger finger on 12/25/2023 5:04 PM Details: 25 G needle, volar approach Medications: 0.5 mL lidocaine 1 %; 20 mg methylPREDNISolone acetate 40 MG/ML Consent was given by the patient. Immediately prior to procedure a time out was called to verify the correct patient, procedure, equipment, support staff and site/side marked as required. Patient was prepped and draped in the usual sterile fashion.     Plan: He will follow-up as needed.  Questions were encouraged and answered at length

## 2023-12-27 DIAGNOSIS — M1611 Unilateral primary osteoarthritis, right hip: Secondary | ICD-10-CM | POA: Diagnosis not present

## 2023-12-27 DIAGNOSIS — M545 Low back pain, unspecified: Secondary | ICD-10-CM | POA: Diagnosis not present

## 2023-12-27 DIAGNOSIS — Z79891 Long term (current) use of opiate analgesic: Secondary | ICD-10-CM | POA: Diagnosis not present

## 2023-12-27 DIAGNOSIS — M4726 Other spondylosis with radiculopathy, lumbar region: Secondary | ICD-10-CM | POA: Diagnosis not present

## 2023-12-27 DIAGNOSIS — G894 Chronic pain syndrome: Secondary | ICD-10-CM | POA: Diagnosis not present

## 2023-12-28 ENCOUNTER — Ambulatory Visit: Admitting: Physician Assistant

## 2023-12-28 DIAGNOSIS — L738 Other specified follicular disorders: Secondary | ICD-10-CM | POA: Diagnosis not present

## 2023-12-28 DIAGNOSIS — L821 Other seborrheic keratosis: Secondary | ICD-10-CM | POA: Diagnosis not present

## 2023-12-28 DIAGNOSIS — L304 Erythema intertrigo: Secondary | ICD-10-CM | POA: Diagnosis not present

## 2023-12-28 DIAGNOSIS — D225 Melanocytic nevi of trunk: Secondary | ICD-10-CM | POA: Diagnosis not present

## 2023-12-28 DIAGNOSIS — L218 Other seborrheic dermatitis: Secondary | ICD-10-CM | POA: Diagnosis not present

## 2023-12-28 DIAGNOSIS — D1801 Hemangioma of skin and subcutaneous tissue: Secondary | ICD-10-CM | POA: Diagnosis not present

## 2023-12-28 DIAGNOSIS — L309 Dermatitis, unspecified: Secondary | ICD-10-CM | POA: Diagnosis not present

## 2024-01-01 ENCOUNTER — Ambulatory Visit: Admitting: Physician Assistant

## 2024-01-01 DIAGNOSIS — M1611 Unilateral primary osteoarthritis, right hip: Secondary | ICD-10-CM | POA: Diagnosis not present

## 2024-01-10 DIAGNOSIS — I872 Venous insufficiency (chronic) (peripheral): Secondary | ICD-10-CM | POA: Diagnosis not present

## 2024-01-24 DIAGNOSIS — M545 Low back pain, unspecified: Secondary | ICD-10-CM | POA: Diagnosis not present

## 2024-01-24 DIAGNOSIS — M4726 Other spondylosis with radiculopathy, lumbar region: Secondary | ICD-10-CM | POA: Diagnosis not present

## 2024-01-24 DIAGNOSIS — M1611 Unilateral primary osteoarthritis, right hip: Secondary | ICD-10-CM | POA: Diagnosis not present

## 2024-01-24 DIAGNOSIS — Z79891 Long term (current) use of opiate analgesic: Secondary | ICD-10-CM | POA: Diagnosis not present

## 2024-01-24 DIAGNOSIS — G894 Chronic pain syndrome: Secondary | ICD-10-CM | POA: Diagnosis not present

## 2024-02-01 DIAGNOSIS — L299 Pruritus, unspecified: Secondary | ICD-10-CM | POA: Diagnosis not present

## 2024-02-08 DIAGNOSIS — G8929 Other chronic pain: Secondary | ICD-10-CM | POA: Diagnosis not present

## 2024-02-08 DIAGNOSIS — M1611 Unilateral primary osteoarthritis, right hip: Secondary | ICD-10-CM | POA: Diagnosis not present

## 2024-02-08 DIAGNOSIS — I10 Essential (primary) hypertension: Secondary | ICD-10-CM | POA: Diagnosis not present

## 2024-02-08 DIAGNOSIS — L299 Pruritus, unspecified: Secondary | ICD-10-CM | POA: Diagnosis not present

## 2024-02-12 DIAGNOSIS — R208 Other disturbances of skin sensation: Secondary | ICD-10-CM | POA: Diagnosis not present

## 2024-02-14 DIAGNOSIS — I2510 Atherosclerotic heart disease of native coronary artery without angina pectoris: Secondary | ICD-10-CM | POA: Diagnosis not present

## 2024-02-16 DIAGNOSIS — L299 Pruritus, unspecified: Secondary | ICD-10-CM | POA: Diagnosis not present

## 2024-02-19 ENCOUNTER — Encounter (HOSPITAL_BASED_OUTPATIENT_CLINIC_OR_DEPARTMENT_OTHER): Payer: Self-pay

## 2024-02-19 NOTE — Progress Notes (Unsigned)
 Cardiology Clinic Note   Patient Name: Franklin Fisher Date of Encounter: 02/21/2024  Primary Care Provider:  Ronna Coho, MD Primary Cardiologist:  Avery Bodo, MD  Patient Profile    Franklin Fisher 83 year old male presents to the clinic today for follow-up evaluation of his hyperlipidemia and essential hypertension.  Past Medical History    Past Medical History:  Diagnosis Date   BPH (benign prostatic hyperplasia)    Gallstones    Headache    stopped at age 62   History of kidney stones    passes 1-2 per year   Hypertension 2000   Dr. 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  Franklin Fisher was seen by Dr.Varanasi at the request of Dr. Maryrose Soja.  Franklin Fisher requested evaluation of his aortic atherosclerosis.  His echocardiogram in 2020 showed LVEF of 60-65%, mild concentric LVH, impaired diastolic function, no evidence of mitral valve stenosis, no aortic valve stenosis, and normal ascending aorta with normal aortic root.  Franklin Fisher had abdominal CT 2023 which showed no abdominal aorta or iliac aneurysm.  Franklin Fisher was noted to have severe atherosclerotic plaque of the aorta and its branches.  There was no abdominal pelvic or inguinal  lymphadenopathy.  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  Franklin Fisher was previously taking Percocet for this.  Franklin Fisher reported needing a right hip replacement.  His right leg pain was noted upon waking and relieved with standing and walking.  Franklin Fisher was seen and evaluated by Dr.Varanasi on 06/10/2022.  During that time Franklin Fisher denied chest pain, dizziness, leg swelling and nitroglycerin use.  Franklin Fisher denied shortness of breath and syncope.  Franklin Fisher presents to the clinic today for follow-up evaluation and states Franklin Fisher continues to do well.  Franklin Fisher feels that Franklin Fisher is fortunate with his health for his age.  We reviewed his past cardiac history.  We reviewed his aortic atherosclerosis.  Franklin Fisher wishes to defer statin therapy.  Franklin Fisher read side effect profile and opted not to start the medication.  We reviewed his upcoming/planned surgery.  Franklin Fisher is able to complete greater than 4 METS of physical activity.  We will plan follow-up in 12 months  Today Franklin Fisher denies chest pain, shortness of breath, lower extremity edema, fatigue, palpitations, melena, hematuria, hemoptysis, diaphoresis, weakness, presyncope, syncope, orthopnea, and PND.    Home Medications    Prior to Admission medications   Medication Sig Start Date End Date Taking? Authorizing Provider  albuterol  (PROVENTIL  HFA;VENTOLIN  HFA) 108 (90 Base) MCG/ACT inhaler Inhale 2 puffs into the lungs every 6 (six) hours as needed for wheezing or shortness of breath.  [provider]  atenolol  (TENORMIN ) 100 MG tablet Take 50 mg by mouth daily. 07/31/22   [provider]  clotrimazole -betamethasone  (LOTRISONE ) cream APPLY ONE APPLICATION EXTERNALLY TWICE DAILY for 30 10/30/20   [provider]  hydrocortisone 2.5 % lotion Apply topically. 05/01/23   [provider]  ketoconazole  (NIZORAL ) 2 % shampoo Apply 1 application topically daily as needed for irritation.  03/29/18   [provider]  oxyCODONE -acetaminophen  (PERCOCET) 7.5-325 MG tablet Take 1  tablet by mouth 4 (four) times daily as needed. 11/07/22   [provider]  tamsulosin  (FLOMAX ) 0.4 MG CAPS capsule Take 0.8 mg by mouth daily.  05/01/18   [provider]    Family History    Family History  Problem Relation Age of Onset   Breast cancer Sister        died at 71   Atrial fibrillation Mother        died 77   Congestive Heart Failure Mother    CAD Father    Heart disease Maternal Grandmother    Heart disease Maternal Grandfather    Franklin Fisher indicated that his mother is deceased. Franklin Fisher indicated that his father is deceased. Franklin Fisher indicated that his sister is deceased. Franklin Fisher indicated that his brother is alive. Franklin Fisher indicated that his maternal grandmother is deceased. Franklin Fisher indicated that his maternal grandfather is deceased. Franklin Fisher indicated that his paternal grandmother is deceased. Franklin Fisher indicated that his paternal grandfather is deceased. Franklin Fisher indicated that his daughter is alive.  Social History    Social History   Socioeconomic History   Marital status: Divorced    Spouse name: Not on file   Number of children: 1   Years of education: Not on file   Highest education level: Not on file  Occupational History   Occupation: retired  Tobacco Use   Smoking status: Never    Passive exposure: Past   Smokeless tobacco: Never  Vaping Use   Vaping status: Never Used  Substance and Sexual Activity   Alcohol  use: Not Currently   Drug use: Never   Sexual activity: Not on file  Other Topics Concern   Not on file  Social History Narrative   Right Handed   Lives in a apartment on the 1st floor, Elevators available   Drinks caffeine 4-5 cups a day   Social Drivers of Health   Financial Resource Strain: Not on file  Food Insecurity: Low Risk  (07/24/2023)   [facility]   Hunger Vital Sign    Worried About Running Out of Food in the Last Year: Never true    Ran Out of Food in the Last Year: Never true  Transportation Needs: No Transportation  Needs (07/24/2023)   [facility]   Transportation    In the past 12 months, has lack of reliable transportation kept you from medical appointments, meetings, work or from getting things needed for daily living? : No  Physical Activity: Not on file  Stress: Not on file  Social Connections: Not on file  Intimate Partner Violence: Not on file     Review of Systems    General:  No chills, fever, night sweats or weight changes.  Cardiovascular:  No chest pain, dyspnea on exertion, edema, orthopnea, palpitations, paroxysmal nocturnal dyspnea. Dermatological: No rash, lesions/masses Respiratory: No cough, dyspnea Urologic: No hematuria, dysuria Abdominal:   No nausea, vomiting, diarrhea, bright red blood per rectum, melena, or hematemesis Neurologic:  No  visual changes, wkns, changes in mental status. All other systems reviewed and are otherwise negative except as noted above.  Physical Exam    VS:  BP 120/78 (BP Location: Left Arm, Patient Position: Sitting, Cuff Size: Normal)   Pulse 72   Ht 5\' 4"  (1.626 m)   Wt 204 lb (92.5 kg)   SpO2 95%   BMI 35.02 kg/m  , BMI Body mass index is 35.02 kg/m. GEN: Well nourished, well developed, in no acute distress. HEENT: normal. Neck: Supple, no JVD, carotid bruits, or masses. Cardiac: RRR, no murmurs, rubs, or gallops. No clubbing, cyanosis, edema.  Radials/DP/PT 2+ and equal bilaterally.  Respiratory:  Respirations regular and unlabored, clear to auscultation bilaterally. GI: Soft, nontender, nondistended, BS + x 4. MS: no deformity or atrophy. Skin: warm and dry, no rash. Neuro:  Strength and sensation are intact. Psych: Normal affect.  Accessory Clinical Findings    Recent Labs: No results found for requested labs within last 365 days.   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  Coronary calcification.   Hepatobiliary: No focal liver abnormality. Status post cholecystectomy. No biliary dilatation.   Pancreas: Diffusely atrophic. No focal lesion. Otherwise normal pancreatic contour. No surrounding inflammatory changes. No main pancreatic ductal dilatation.   Spleen: Normal in size without focal abnormality.   Adrenals/Urinary Tract:   No adrenal nodule bilaterally.   Bilateral nephrolithiasis measuring up to 6 mm on the right and 2 mm on the left. No hydronephrosis. No definite contour-deforming renal mass.   No ureterolithiasis or hydroureter.   The urinary bladder is unremarkable.   Stomach/Bowel: Stomach is within normal limits. No evidence of bowel wall thickening or dilatation. Diffuse colonic diverticulosis most prominent within the sigmoid colon. Appendix appears normal.   Vascular/Lymphatic: No abdominal aorta or iliac aneurysm. Normal severe atherosclerotic plaque of the aorta and its branches. No abdominal, pelvic, or inguinal lymphadenopathy.   Reproductive: Prostate is unremarkable.   Other: No intraperitoneal free fluid. No intraperitoneal free gas. No organized fluid collection.   Musculoskeletal:   No abdominal wall hernia or abnormality.   No suspicious lytic or blastic osseous lesions. No acute displaced fracture. Multilevel degenerative changes of the spine. Total left hip arthroplasty.   IMPRESSION: 1. Nonobstructive  bilateral nephrolithiasis measuring up to 6 mm on the right and 2mm on the left. 2. Diffuse colonic diverticulosis with no acute diverticulitis. 3.  Aortic Atherosclerosis (ICD10-I70.0).     Electronically Signed   By: Franklin  Fisher M.D.   On: 04/19/2022 19:52     Assessment & Plan   1.  Aortic atherosclerosis-noted on abdominal CT 2023.  Reports compliance with rosuvastatin .  Continues to deny lower extremity claudication. Did not start taking crestor  d/t side effect profile.  High-fiber diet Increase physical activity as tolerated   Essential hypertension-BP today 120/78. Continue atenolol  Heart healthy  low-sodium diet Maintain blood pressure log  Obesity-weight today 204. Increase physical activity Heart healthy low-sodium diet  Preoperative cardiac evaluation-   Right Total Hip Replacement    Date of Surgery:  Clearance TBD                                 Surgeon: Dr. Marletta Simmering Group or Practice Name: [name] Phone number: [phone] Fax number: [phone]    Primary Cardiologist: Avery Bodo, MD  Chart reviewed as part of pre-operative protocol coverage. Given past medical history and time since last visit, based on ACC/AHA guidelines, Franklin Fisher would be at acceptable risk for the planned procedure without further cardiovascular testing.   His RCRI is very low risk, 0.4% risk of major cardiac event.  Franklin Fisher is able to complete greater than 4 METS of physical activity.  Patient was advised that if Franklin Fisher develops new symptoms prior to surgery to contact our office to arrange a follow-up appointment.  Franklin Fisher verbalized understanding.  I will route this recommendation to the requesting party via Epic fax function and remove from pre-op pool.     Disposition: Follow-up with Dr. Rolm Clos or me in 12 months.   Franklin Fisher. Roderic Lammert NP-C     02/21/2024, 8:28 AM  [facility] [address] Office [phone] Fax  [phone]    I spent 14 minutes examining this patient, reviewing medications, and using patient centered shared decision making involving their cardiac care.   I spent  20 minutes reviewing past medical history,  medications, and prior cardiac tests.

## 2024-02-20 ENCOUNTER — Telehealth: Payer: Self-pay | Admitting: General Practice

## 2024-02-20 NOTE — Telephone Encounter (Signed)
 Patient has appointment with Lawana Pray, NP on 02/21/24 at which time clearance will be addressed.  Appointment notes have been updated.  Gerldine Koch, NP-C  02/20/2024, 11:26 AM [address], [address] [place], [place] [address] Office [phone] Fax [phone]

## 2024-02-20 NOTE — Telephone Encounter (Signed)
   Pre-operative Risk Assessment    Patient Name: Franklin Fisher  DOB: 21-Feb-1941 MRN: [id number]      Request for Surgical Clearance    Procedure:   Right Total Hip Replacement   Date of Surgery:  Clearance TBD                                 Surgeon: Dr. Marletta Simmering Group or Practice Name: [name] Other Phone number: [phone] Fax number: [phone]   Type of Clearance Requested:   - Medical  - Pharmacy:  Hold        Type of Anesthesia:  Spinal   Additional requests/questions:  Please advise surgeon/provider what medications should be held.  [name]   02/20/2024, 9:49 AM

## 2024-02-20 NOTE — Telephone Encounter (Signed)
 I will update all parties involved pt has appt 02/21/24 with Lawana Pray, FNP.

## 2024-02-21 ENCOUNTER — Encounter: Payer: Self-pay | Admitting: General Practice

## 2024-02-21 ENCOUNTER — Ambulatory Visit: Attending: General Practice | Admitting: General Practice

## 2024-02-21 VITALS — BP 120/78 | HR 72 | Ht 64.0 in | Wt 204.0 lb

## 2024-02-21 DIAGNOSIS — Z01810 Encounter for preprocedural cardiovascular examination: Secondary | ICD-10-CM | POA: Diagnosis not present

## 2024-02-21 DIAGNOSIS — I700 Atherosclerosis of aorta: Secondary | ICD-10-CM | POA: Diagnosis not present

## 2024-02-21 DIAGNOSIS — I10 Essential (primary) hypertension: Secondary | ICD-10-CM

## 2024-02-21 DIAGNOSIS — E66811 Obesity, class 1: Secondary | ICD-10-CM

## 2024-02-21 NOTE — Patient Instructions (Signed)
 Medication Instructions:  Your physician recommends that you continue on your current medications as directed. Please refer to the Current Medication list given to you today.  *If you need a refill on your cardiac medications before your next appointment, please call your pharmacy*  Lab Work: None ordered today. If you have labs (blood work) drawn today and your tests are completely normal, you will receive your results only by: MyChart Message (if you have MyChart) OR A paper copy in the mail If you have any lab test that is abnormal or we need to change your treatment, we will call you to review the results.  Testing/Procedures: None ordered today.  Follow-Up: At [facility], you and your health needs are our priority.  As part of our continuing mission to provide you with exceptional heart care, our providers are all part of one team.  This team includes your primary Cardiologist (physician) and Advanced Practice Providers or APPs (Physician Assistants and Nurse Practitioners) who all work together to provide you with the care you need, when you need it.  Your next appointment:   1 year(s)  The format for your next appointment:   In Person  Provider:   Dr. Rolm Clos

## 2024-02-26 DIAGNOSIS — I87393 Chronic venous hypertension (idiopathic) with other complications of bilateral lower extremity: Secondary | ICD-10-CM | POA: Diagnosis not present

## 2024-02-26 DIAGNOSIS — M7989 Other specified soft tissue disorders: Secondary | ICD-10-CM | POA: Diagnosis not present

## 2024-02-27 DIAGNOSIS — Z79891 Long term (current) use of opiate analgesic: Secondary | ICD-10-CM | POA: Diagnosis not present

## 2024-02-27 DIAGNOSIS — G894 Chronic pain syndrome: Secondary | ICD-10-CM | POA: Diagnosis not present

## 2024-02-27 DIAGNOSIS — M545 Low back pain, unspecified: Secondary | ICD-10-CM | POA: Diagnosis not present

## 2024-02-27 DIAGNOSIS — M1611 Unilateral primary osteoarthritis, right hip: Secondary | ICD-10-CM | POA: Diagnosis not present

## 2024-02-27 DIAGNOSIS — M4726 Other spondylosis with radiculopathy, lumbar region: Secondary | ICD-10-CM | POA: Diagnosis not present

## 2024-02-28 DIAGNOSIS — G2581 Restless legs syndrome: Secondary | ICD-10-CM | POA: Diagnosis not present

## 2024-02-28 DIAGNOSIS — G8929 Other chronic pain: Secondary | ICD-10-CM | POA: Diagnosis not present

## 2024-02-28 DIAGNOSIS — L299 Pruritus, unspecified: Secondary | ICD-10-CM | POA: Diagnosis not present

## 2024-03-01 DIAGNOSIS — H532 Diplopia: Secondary | ICD-10-CM | POA: Diagnosis not present

## 2024-03-05 NOTE — Telephone Encounter (Signed)
 Office calling to check status of clearance, requesting cb

## 2024-03-05 NOTE — Telephone Encounter (Signed)
 Preop clearance routed to requesting party [name]  Fax#: [phone]

## 2024-03-07 DIAGNOSIS — I10 Essential (primary) hypertension: Secondary | ICD-10-CM | POA: Diagnosis not present

## 2024-03-07 DIAGNOSIS — I872 Venous insufficiency (chronic) (peripheral): Secondary | ICD-10-CM | POA: Diagnosis not present

## 2024-03-08 DIAGNOSIS — M25559 Pain in unspecified hip: Secondary | ICD-10-CM | POA: Diagnosis not present

## 2024-03-08 DIAGNOSIS — Z01818 Encounter for other preprocedural examination: Secondary | ICD-10-CM | POA: Diagnosis not present

## 2024-03-08 DIAGNOSIS — I10 Essential (primary) hypertension: Secondary | ICD-10-CM | POA: Diagnosis not present

## 2024-03-14 DIAGNOSIS — Z01818 Encounter for other preprocedural examination: Secondary | ICD-10-CM | POA: Diagnosis not present

## 2024-03-14 DIAGNOSIS — I10 Essential (primary) hypertension: Secondary | ICD-10-CM | POA: Diagnosis not present

## 2024-03-20 ENCOUNTER — Other Ambulatory Visit: Payer: Self-pay | Admitting: Orthopedic Surgery

## 2024-03-28 DIAGNOSIS — L235 Allergic contact dermatitis due to other chemical products: Secondary | ICD-10-CM | POA: Diagnosis not present

## 2024-03-28 DIAGNOSIS — L738 Other specified follicular disorders: Secondary | ICD-10-CM | POA: Diagnosis not present

## 2024-03-28 DIAGNOSIS — R208 Other disturbances of skin sensation: Secondary | ICD-10-CM | POA: Diagnosis not present

## 2024-03-28 DIAGNOSIS — L820 Inflamed seborrheic keratosis: Secondary | ICD-10-CM | POA: Diagnosis not present

## 2024-04-01 ENCOUNTER — Ambulatory Visit (INDEPENDENT_AMBULATORY_CARE_PROVIDER_SITE_OTHER): Admitting: Podiatry

## 2024-04-01 DIAGNOSIS — B351 Tinea unguium: Secondary | ICD-10-CM

## 2024-04-01 DIAGNOSIS — M79674 Pain in right toe(s): Secondary | ICD-10-CM

## 2024-04-01 DIAGNOSIS — M79675 Pain in left toe(s): Secondary | ICD-10-CM

## 2024-04-01 NOTE — Progress Notes (Signed)
 Subjective: Chief Complaint  Patient presents with   RFC    Needs nails trimmed. Would also like to discuss worsening neuropathy. Not diabetic. No anticoag.     83 year old male presents the office for above concerns.  He states that his nails are dystrophic and walking he is not able to trim them himself and are causing discomfort.  No swelling redness or drainage.    He states he has had neuropathy for 20-30 years. He states the feet are not painful to walk.  He states his girlfriend will wash and dry his feet and just the touching is painful. He is still not on anything for neuropathy.  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  Ronna Coho, MD Last seen 03/14/2024  Objective: AAO x3, NAD DP/PT pulses palpable bilaterally, CRT less than 3 seconds Nails are hypertrophic, dystrophic, brittle, discolored, elongated 10. No surrounding redness or drainage. Tenderness nails 1-5 bilaterally. No open lesions or pre-ulcerative lesions are identified today. Dried blood right medial nail border, just noticed 1 month ago.  No signs of hyper mentation of the surrounding skin.  This has resolved.  No pain with calf compression, swelling, warmth, erythema  Assessment: Symptomatic onychomycosis; neuropathy   Plan: -All treatment options discussed with the patient including all alternatives, risks, complications.  -Sharply debrided nails x 10 without any complications or bleeding.   -He wants to hold off on medication for neuropathy  -Daily foot inspection  Return in about 3 months (around 07/02/2024) for nail trim, neuropathy .  Charity Conch DPM

## 2024-04-02 DIAGNOSIS — Z79891 Long term (current) use of opiate analgesic: Secondary | ICD-10-CM | POA: Diagnosis not present

## 2024-04-02 DIAGNOSIS — M545 Low back pain, unspecified: Secondary | ICD-10-CM | POA: Diagnosis not present

## 2024-04-02 DIAGNOSIS — G894 Chronic pain syndrome: Secondary | ICD-10-CM | POA: Diagnosis not present

## 2024-04-02 DIAGNOSIS — M1611 Unilateral primary osteoarthritis, right hip: Secondary | ICD-10-CM | POA: Diagnosis not present

## 2024-04-02 DIAGNOSIS — M4726 Other spondylosis with radiculopathy, lumbar region: Secondary | ICD-10-CM | POA: Diagnosis not present

## 2024-04-04 ENCOUNTER — Encounter (HOSPITAL_COMMUNITY): Payer: Self-pay

## 2024-04-04 NOTE — Progress Notes (Addendum)
 PCP - Ronna Coho, MD   Cardiologist - Jocelyn Mule, MD  LOV 02-21-24 Lawana Pray, NP clearance  PPM/ICD -  Device Orders -  Rep Notified -   Chest x-ray -  EKG - 02-21-24 epic Stress Test -  ECHO - 2020 epic Cardiac Cath -   Sleep Study -  CPAP -   Fasting Blood Sugar -  Checks Blood Sugar _____ times a day  Blood Thinner Instructions: Aspirin  Instructions:  ERAS Protcol - PRE-SURGERY Ensure     COVID vaccine -  Activity-- Anesthesia review:   Patient denies shortness of breath, fever, cough and chest pain at PAT appointment   All instructions explained to the patient, with a verbal understanding of the material. Patient agrees to go over the instructions while at home for a better understanding. Patient also instructed to self quarantine after being tested for COVID-19. The opportunity to ask questions was provided.

## 2024-04-04 NOTE — Patient Instructions (Addendum)
 SURGICAL WAITING ROOM VISITATION  Patients having surgery or a procedure may have no more than 2 support people in the waiting area - these visitors may rotate.    Children under the age of 51 must have an adult with them who is not the patient.  Visitors with respiratory illnesses are discouraged from visiting and should remain at home.  If the patient needs to stay at the hospital during part of their recovery, the visitor guidelines for inpatient rooms apply. Pre-op nurse will coordinate an appropriate time for 1 support person to accompany patient in pre-op.  This support person may not rotate.    Please refer to the [facility] website for the visitor guidelines for Inpatients (after your surgery is over and you are in a regular room).    Your procedure is scheduled on: 04-29-24   Report to [facility] Main Entrance    Report to admitting at 7:10  AM   Call this number if you have problems the morning of surgery [phone]   Do not eat food :After Midnight.   After Midnight you may have the following liquids until 6:40 AM DAY OF SURGERY   then nothing by mouth  Water  Non-Citrus Juices (without pulp, NO RED-Apple, White grape, White cranberry) Black Coffee (NO MILK/CREAM OR CREAMERS, sugar ok)  Clear Tea (NO MILK/CREAM OR CREAMERS, sugar ok) regular and decaf                             Plain Jell-O (NO RED)                                           Fruit ices (not with fruit pulp, NO RED)                                     Popsicles (NO RED)                                                               Sports drinks like Gatorade (NO RED)                     The day of surgery:  Drink ONE (1) Pre-Surgery Clear Ensure BY 6:40  AM the morning of surgery. Drink in one sitting. Do not sip.  This drink was given to you during your hospital  pre-op appointment visit. Nothing else to drink after completing the  Pre-Surgery Clear Ensure.          If you have  questions, please contact your surgeon's office.   FOLLOW ANY ADDITIONAL PRE OP INSTRUCTIONS YOU RECEIVED FROM YOUR SURGEON'S OFFICE!!!     Oral Hygiene is also important to reduce your risk of infection.                                    Remember - BRUSH YOUR TEETH THE MORNING OF SURGERY WITH YOUR REGULAR TOOTHPASTE  DENTURES WILL BE REMOVED PRIOR  TO SURGERY PLEASE DO NOT APPLY Poly grip OR ADHESIVES!!!   Stop all vitamins and herbal supplements 7 days before surgery.   Take these medicines the morning of surgery with A SIP OF WATER : Tamsulosin , Oxycodone  if needed, Atenolol , bring rescue inhaler with you                              You may not have any metal on your body including jewelry, and body piercing             Do not wear  lotions, powders, cologne, or deodorant              Men may shave face and neck.   Do not bring valuables to the hospital. [facility] IS NOT             RESPONSIBLE   FOR VALUABLES.   Contacts, glasses, dentures or bridgework may not be worn into surgery.   Bring small overnight bag day of surgery.   DO NOT BRING YOUR HOME MEDICATIONS TO THE HOSPITAL. PHARMACY WILL DISPENSE MEDICATIONS LISTED ON YOUR MEDICATION LIST TO YOU DURING YOUR ADMISSION IN THE HOSPITAL!              Please read over the following fact sheets you were given: IF YOU HAVE QUESTIONS ABOUT YOUR PRE-OP INSTRUCTIONS PLEASE CALL [phone]   . If you test positive for Covid or have been in contact with anyone that has tested positive in the last 10 days please notify you surgeon.      Pre-operative 5 CHG Bath Instructions   You can play a key role in reducing the risk of infection after surgery. Your skin needs to be as free of germs as possible. You can reduce the number of germs on your skin by washing with CHG (chlorhexidine  gluconate) soap before surgery. CHG is an antiseptic soap that kills germs and continues to kill germs even after washing.   DO NOT use  if you have an allergy to chlorhexidine /CHG or antibacterial soaps. If your skin becomes reddened or irritated, stop using the CHG and notify one of our RNs at [phone].   Please shower with the CHG soap starting 4 days before surgery using the following schedule:     Please keep in mind the following:  DO NOT shave, including legs and underarms, starting the day of your first shower.   You may shave your face at any point before/day of surgery.  Place clean sheets on your bed the day you start using CHG soap. Use a clean washcloth (not used since being washed) for each shower. DO NOT sleep with pets once you start using the CHG.   CHG Shower Instructions:  If you choose to wash your hair and private area, wash first with your normal shampoo/soap.  After you use shampoo/soap, rinse your hair and body thoroughly to remove shampoo/soap residue.  Turn the water  OFF and apply about 3 tablespoons (45 ml) of CHG soap to a CLEAN washcloth.  Apply CHG soap ONLY FROM YOUR NECK DOWN TO YOUR TOES (washing for 3-5 minutes)  DO NOT use CHG soap on face, private areas, open wounds, or sores.  Pay special attention to the area where your surgery is being performed.  If you are having back surgery, having someone wash your back for you may be helpful. Wait 2 minutes after CHG soap is applied, then you may rinse  off the CHG soap.  Pat dry with a clean towel  Put on clean clothes/pajamas   If you choose to wear lotion, please use ONLY the CHG-compatible lotions on the back of this paper.     Additional instructions for the day of surgery: DO NOT APPLY any lotions, deodorants, cologne, or perfumes.   Put on clean/comfortable clothes.  Brush your teeth.  Ask your nurse before applying any prescription medications to the skin.      CHG Compatible Lotions   Aveeno Moisturizing lotion  Cetaphil Moisturizing Cream  Cetaphil Moisturizing Lotion  Clairol Herbal Essence Moisturizing Lotion, Dry  Skin  Clairol Herbal Essence Moisturizing Lotion, Extra Dry Skin  Clairol Herbal Essence Moisturizing Lotion, Normal Skin  Curel Age Defying Therapeutic Moisturizing Lotion with Alpha Hydroxy  Curel Extreme Care Body Lotion  Curel Soothing Hands Moisturizing Hand Lotion  Curel Therapeutic Moisturizing Cream, Fragrance-Free  Curel Therapeutic Moisturizing Lotion, Fragrance-Free  Curel Therapeutic Moisturizing Lotion, Original Formula  Eucerin Daily Replenishing Lotion  Eucerin Dry Skin Therapy Plus Alpha Hydroxy Crme  Eucerin Dry Skin Therapy Plus Alpha Hydroxy Lotion  Eucerin Original Crme  Eucerin Original Lotion  Eucerin Plus Crme Eucerin Plus Lotion  Eucerin TriLipid Replenishing Lotion  Keri Anti-Bacterial Hand Lotion  Keri Deep Conditioning Original Lotion Dry Skin Formula Softly Scented  Keri Deep Conditioning Original Lotion, Fragrance Free Sensitive Skin Formula  Keri Lotion Fast Absorbing Fragrance Free Sensitive Skin Formula  Keri Lotion Fast Absorbing Softly Scented Dry Skin Formula  Keri Original Lotion  Keri Skin Renewal Lotion Keri Silky Smooth Lotion  Keri Silky Smooth Sensitive Skin Lotion  Nivea Body Creamy Conditioning Oil  Nivea Body Extra Enriched Teacher, adult education Moisturizing Lotion Nivea Crme  Nivea Skin Firming Lotion  NutraDerm 30 Skin Lotion  NutraDerm Skin Lotion  NutraDerm Therapeutic Skin Cream  NutraDerm Therapeutic Skin Lotion  ProShield Protective Hand Cream  WHAT IS A BLOOD TRANSFUSION? Blood Transfusion Information  A transfusion is the replacement of blood or some of its parts. Blood is made up of multiple cells which provide different functions. Red blood cells carry oxygen and are used for blood loss replacement. White blood cells fight against infection. Platelets control bleeding. Plasma helps clot blood. Other blood products are available for specialized needs, such as hemophilia or other clotting  disorders. BEFORE THE TRANSFUSION  Who gives blood for transfusions?  Healthy volunteers who are fully evaluated to make sure their blood is safe. This is blood bank blood. Transfusion therapy is the safest it has ever been in the practice of medicine. Before blood is taken from a donor, a complete history is taken to make sure that person has no history of diseases nor engages in risky social behavior (examples are intravenous drug use or sexual activity with multiple partners). The donor's travel history is screened to minimize risk of transmitting infections, such as malaria. The donated blood is tested for signs of infectious diseases, such as HIV and hepatitis. The blood is then tested to be sure it is compatible with you in order to minimize the chance of a transfusion reaction. If you or a relative donates blood, this is often done in anticipation of surgery and is not appropriate for emergency situations. It takes many days to process the donated blood. RISKS AND COMPLICATIONS Although transfusion therapy is very safe and saves many lives, the main dangers of transfusion include:  Getting an infectious disease. Developing a transfusion reaction. This is  an allergic reaction to something in the blood you were given. Every precaution is taken to prevent this. The decision to have a blood transfusion has been considered carefully by your caregiver before blood is given. Blood is not given unless the benefits outweigh the risks. AFTER THE TRANSFUSION Right after receiving a blood transfusion, you will usually feel much better and more energetic. This is especially true if your red blood cells have gotten low (anemic). The transfusion raises the level of the red blood cells which carry oxygen, and this usually causes an energy increase. The nurse administering the transfusion will monitor you carefully for complications. HOME CARE INSTRUCTIONS  No special instructions are needed after a transfusion.  You may find your energy is better. Speak with your caregiver about any limitations on activity for underlying diseases you may have. SEEK MEDICAL CARE IF:  Your condition is not improving after your transfusion. You develop redness or irritation at the intravenous (IV) site. SEEK IMMEDIATE MEDICAL CARE IF:  Any of the following symptoms occur over the next 12 hours: Shaking chills. You have a temperature by mouth above 102 F (38.9 C), not controlled by medicine. Chest, back, or muscle pain. People around you feel you are not acting correctly or are confused. Shortness of breath or difficulty breathing. Dizziness and fainting. You get a rash or develop hives. You have a decrease in urine output. Your urine turns a dark color or changes to pink, red, or brown. Any of the following symptoms occur over the next 10 days: You have a temperature by mouth above 102 F (38.9 C), not controlled by medicine. Shortness of breath. Weakness after normal activity. The white part of the eye turns yellow (jaundice). You have a decrease in the amount of urine or are urinating less often. Your urine turns a dark color or changes to pink, red, or brown. Document Released: 09/30/2000 Document Revised: 12/26/2011 Document Reviewed: 05/19/2008 Exit[employer] Patient Information 2014 [place], [place].  _______________________________________________________________________  Incentive Spirometer  An incentive spirometer is a tool that can help keep your lungs clear and active. This tool measures how well you are filling your lungs with each breath. Taking long deep breaths may help reverse or decrease the chance of developing breathing (pulmonary) problems (especially infection) following: A long period of time when you are unable to move or be active. BEFORE THE PROCEDURE  If the spirometer includes an indicator to show your best effort, your nurse or respiratory therapist will set it to a desired goal. If  possible, sit up straight or lean slightly forward. Try not to slouch. Hold the incentive spirometer in an upright position. INSTRUCTIONS FOR USE  Sit on the edge of your bed if possible, or sit up as far as you can in bed or on a chair. Hold the incentive spirometer in an upright position. Breathe out normally. Place the mouthpiece in your mouth and seal your lips tightly around it. Breathe in slowly and as deeply as possible, raising the piston or the ball toward the top of the column. Hold your breath for 3-5 seconds or for as long as possible. Allow the piston or ball to fall to the bottom of the column. Remove the mouthpiece from your mouth and breathe out normally. Rest for a few seconds and repeat Steps 1 through 7 at least 10 times every 1-2 hours when you are awake. Take your time and take a few normal breaths between deep breaths. The spirometer may include an indicator to  show your best effort. Use the indicator as a goal to work toward during each repetition. After each set of 10 deep breaths, practice coughing to be sure your lungs are clear. If you have an incision (the cut made at the time of surgery), support your incision when coughing by placing a pillow or rolled up towels firmly against it. Once you are able to get out of bed, walk around indoors and cough well. You may stop using the incentive spirometer when instructed by your caregiver.  RISKS AND COMPLICATIONS Take your time so you do not get dizzy or light-headed. If you are in pain, you may need to take or ask for pain medication before doing incentive spirometry. It is harder to take a deep breath if you are having pain. AFTER USE Rest and breathe slowly and easily. It can be helpful to keep track of a log of your progress. Your caregiver can provide you with a simple table to help with this. If you are using the spirometer at home, follow these instructions: SEEK MEDICAL CARE IF:  You are having difficultly using the  spirometer. You have trouble using the spirometer as often as instructed. Your pain medication is not giving enough relief while using the spirometer. You develop fever of 100.5 F (38.1 C) or higher. SEEK IMMEDIATE MEDICAL CARE IF:  You cough up bloody sputum that had not been present before. You develop fever of 102 F (38.9 C) or greater. You develop worsening pain at or near the incision site. MAKE SURE YOU:  Understand these instructions. Will watch your condition. Will get help right away if you are not doing well or get worse. Document Released: 02/13/2007 Document Revised: 12/26/2011 Document Reviewed: 04/16/2007 [facility] Patient Information 2014 [place], [place].   ________________________________________________________________________

## 2024-04-12 DIAGNOSIS — M1611 Unilateral primary osteoarthritis, right hip: Secondary | ICD-10-CM | POA: Diagnosis not present

## 2024-04-17 ENCOUNTER — Encounter (HOSPITAL_COMMUNITY)
Admission: RE | Admit: 2024-04-17 | Discharge: 2024-04-17 | Disposition: A | Discharge status: Home / Self Care | Source: Ambulatory Visit | Attending: Orthopedic Surgery | Admitting: Orthopedic Surgery

## 2024-04-17 ENCOUNTER — Encounter (HOSPITAL_COMMUNITY): Payer: Self-pay

## 2024-04-17 ENCOUNTER — Other Ambulatory Visit: Payer: Self-pay

## 2024-04-17 ENCOUNTER — Ambulatory Visit (HOSPITAL_COMMUNITY)
Admission: RE | Admit: 2024-04-17 | Discharge: 2024-04-17 | Disposition: A | Discharge status: Home / Self Care | Source: Ambulatory Visit | Attending: Orthopedic Surgery | Admitting: Orthopedic Surgery

## 2024-04-17 VITALS — BP 152/74 | HR 60 | Temp 98.1°F | Resp 16 | Ht 64.0 in | Wt 210.0 lb

## 2024-04-17 DIAGNOSIS — Z01818 Encounter for other preprocedural examination: Secondary | ICD-10-CM

## 2024-04-17 DIAGNOSIS — I10 Essential (primary) hypertension: Secondary | ICD-10-CM

## 2024-04-17 HISTORY — DX: Unspecified osteoarthritis, unspecified site: M19.90

## 2024-04-17 HISTORY — DX: Unspecified asthma, uncomplicated: J45.909

## 2024-04-17 HISTORY — DX: Pneumonia, unspecified organism: J18.9

## 2024-04-17 LAB — CBC
HCT: 44.6 % (ref 39.0–52.0)
Hemoglobin: 15 g/dL (ref 13.0–17.0)
MCH: 31.5 pg (ref 26.0–34.0)
MCHC: 33.6 g/dL (ref 30.0–36.0)
MCV: 93.7 fL (ref 80.0–100.0)
Platelets: 157 10*3/uL (ref 150–400)
RBC: 4.76 MIL/uL (ref 4.22–5.81)
RDW: 13 % (ref 11.5–15.5)
WBC: 6.2 10*3/uL (ref 4.0–10.5)
nRBC: 0 % (ref 0.0–0.2)

## 2024-04-17 LAB — BASIC METABOLIC PANEL WITH GFR
Anion gap: 9 (ref 5–15)
BUN: 21 mg/dL (ref 8–23)
CO2: 24 mmol/L (ref 22–32)
Calcium: 8.8 mg/dL — ABNORMAL LOW (ref 8.9–10.3)
Chloride: 107 mmol/L (ref 98–111)
Creatinine, Ser: 1.04 mg/dL (ref 0.61–1.24)
GFR, Estimated: >60 mL/min (ref >60)
Glucose, Bld: 120 mg/dL — ABNORMAL HIGH (ref 70–99)
Potassium: 4 mmol/L (ref 3.5–5.1)
Sodium: 140 mmol/L (ref 135–145)

## 2024-04-17 LAB — SURGICAL PCR SCREEN
MRSA, PCR: NEGATIVE
Staphylococcus aureus: NEGATIVE

## 2024-04-17 LAB — TYPE AND SCREEN
ABO/RH(D): O NEG
Antibody Screen: NEGATIVE

## 2024-04-17 NOTE — Progress Notes (Signed)
 COVID Vaccine Completed:  Date of COVID positive in last 90 days:  PCP - [employer], MD Cardiologist - Candyce Reek, MD LOV 02/21/24  Cardiac clearance by Josefa Beauvais, NP 02/21/24 Epic  Chest x-ray - 04/17/24 Epic EKG - 02/21/24 Epic Stress Test - 20+ years ago ECHO - 2020 Epic Cardiac Cath - n/a Pacemaker/ICD device last checked: n/a Spinal Cord Stimulator: n/a  Bowel Prep - no  Sleep Study - yes CPAP - no, was mild  Fasting Blood Sugar - n/a Checks Blood Sugar _____ times a day  Last dose of GLP1 agonist-  N/A GLP1 instructions:  Do not take after     Last dose of SGLT-2 inhibitors-  N/A SGLT-2 instructions:  Do not take after     Blood Thinner Instructions:  Last dose:  n/a  Time: Aspirin  Instructions: Last Dose:  Activity level: Can go up a flight of stairs and perform activities of daily living without stopping and without symptoms of chest pain or shortness of breath.  Anesthesia Review:   Patient denies shortness of breath, fever, cough and chest pain at PAT appointment  Patient verbalized understanding of instructions that were given to them at the PAT appointment. Patient was also instructed that they will need to review over the PAT instructions again at home before surgery.

## 2024-04-24 DIAGNOSIS — B354 Tinea corporis: Secondary | ICD-10-CM | POA: Diagnosis not present

## 2024-04-24 DIAGNOSIS — R7309 Other abnormal glucose: Secondary | ICD-10-CM | POA: Diagnosis not present

## 2024-04-24 DIAGNOSIS — L2989 Other pruritus: Secondary | ICD-10-CM | POA: Diagnosis not present

## 2024-04-24 DIAGNOSIS — M1611 Unilateral primary osteoarthritis, right hip: Secondary | ICD-10-CM | POA: Diagnosis not present

## 2024-04-24 DIAGNOSIS — K769 Liver disease, unspecified: Secondary | ICD-10-CM | POA: Diagnosis not present

## 2024-04-24 NOTE — Care Plan (Signed)
 Ortho Bundle Case Management Note  Patient Details  Name: Franklin Fisher MRN: [id number] Date of Birth: 1941/05/04  spoke with patient on phone. he will discharge to his GF's home. [facility] Clause - [address], [place]. has DME. HHPT referral to [facility] for HHPT and OT. OPPT set up with [facility]. he will hire caregivers to assist. understands this isn't covered by Medicare. discharge instructions discussed and mailed to patient. Paitent and MD in agreement with plan. Choice offered.                     DME Arranged:    DME Agency:     HH Arranged:  PT, OT HH Agency:  [facility]  Additional Comments: Please contact me with any questions of if this plan should need to change.  Charlies Pitch,  RN,BSN,MHA,CCM  [facility]ist  [phone] 04/24/2024, 4:50 PM

## 2024-04-24 NOTE — H&P (Signed)
 TOTAL HIP ADMISSION H&P  Patient is admitted for right total hip arthroplasty.  Subjective:  Chief Complaint: right hip pain  HPI: Franklin Fisher, 83 y.o. male, has a history of pain and functional disability in the right hip(s) due to arthritis and patient has failed non-surgical conservative treatments for greater than 12 weeks to include NSAID's and/or analgesics, flexibility and strengthening excercises, use of assistive devices, weight reduction as appropriate, and activity modification.  Onset of symptoms was gradual starting 5 years ago with gradually worsening course since that time.The patient noted no past surgery on the right hip(s).  Patient currently rates pain in the right hip at 10 out of 10 with activity. Patient has night pain, worsening of pain with activity and weight bearing, trendelenberg gait, pain that interfers with activities of daily living, and pain with passive range of motion. Patient has evidence of subchondral sclerosis and joint space narrowing by imaging studies. This condition presents safety issues increasing the risk of falls.   There is no current active infection.  Patient Active Problem List   Diagnosis Date Noted   Gallstones 09/27/2021   Back pain 03/23/2021   Hyperglycemia 03/23/2021   Insomnia 03/23/2021   Presence of unspecified artificial hip joint 03/23/2021   Restless legs syndrome 03/23/2021   Tinnitus of left ear 03/23/2021   Chronic otitis externa of both ears 10/30/2020   Halitosis 10/30/2020   History of total hip arthroplasty, left 04/01/2019   Osteoarthritis of right hip 03/29/2019   Preop cardiovascular exam 03/28/2019   Abnormal electrocardiogram (ECG) (EKG) 03/28/2019   Essential hypertension 03/28/2019   Deviated septum 09/11/2018   Nasal turbinate hypertrophy 09/11/2018   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/11/2018   History of nephrolithiasis 04/28/2016   Acne necrotica 03/11/2016   Intertrigo 03/11/2016   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 Kip   Pneumonia    Sleep apnea    CPAP   TIA (transient ischemic attack)    pt denies    Past Surgical History:  Procedure Laterality Date   CATARACT EXTRACTION, BILATERAL Bilateral 2015   CHOLECYSTECTOMY     INGUINAL HERNIA REPAIR     bi lat inguinal   TONSILLECTOMY     age 52   TOTAL HIP ARTHROPLASTY Left 04/01/2019   Procedure: LEFT TOTAL HIP ARTHROPLASTY ANTERIOR APPROACH;  Surgeon: Liam Lerner, MD;  Location: [facility];  Service: Orthopedics;  Laterality: Left;   UMBILICAL HERNIA REPAIR      Current Facility-Administered Medications  Medication Dose Route Frequency Provider Last Rate Last Admin   diclofenac  sodium (VOLTAREN ) 1 % transdermal gel 2 g  2 g Topical QID Clark, Gilbert W, PA-C       Current Outpatient Medications  Medication Sig Dispense Refill Last Dose/Taking   atenolol  (TENORMIN ) 100 MG tablet Take 100 mg by mouth in the morning.   Taking   hydrocortisone 2.5 % lotion Apply 1 Application  topically 2 (two) times daily as needed (rash/irritation.).   Taking As Needed   ketoconazole  (NIZORAL ) 2 % shampoo Apply 1 application  topically once a week.   Taking   nystatin ointment (MYCOSTATIN) Apply 1 Application topically daily as needed (skin irritation/rash (skin folds)).   Taking As Needed   oxyCODONE -acetaminophen  (PERCOCET) 7.5-325 MG tablet Take 1 tablet by mouth 4 (four) times daily as needed (arthritis pain.).   Taking As Needed   Polyethyl  Glycol-Propyl Glycol (LUBRICANT EYE DROPS) 0.4-0.3 % SOLN Place 1-2 drops into both eyes 2 (two) times daily as needed (dry/irritated eyes.).   Taking As Needed   tamsulosin  (FLOMAX ) 0.4 MG CAPS capsule Take 0.8 mg by mouth in the morning.   Taking   Allergies  Allergen Reactions   Iodinated Contrast Media Hives   Other     Other Reaction(s): hives within seconds   Prednisone  & Diphenhydramine  Other (See Comments)    Patient denies this allergy    Social History   Tobacco Use   Smoking status: Never    Passive exposure: Past   Smokeless tobacco: Never  Substance Use Topics   Alcohol  use: Not Currently    Family History  Problem Relation Age of Onset   Breast cancer Sister        died at 3   Atrial fibrillation Mother        died 21   Congestive Heart Failure Mother    CAD Father    Heart disease Maternal Grandmother    Heart disease Maternal Grandfather      Review of Systems  Constitutional: Negative.   HENT: Negative.    Eyes: Negative.   Cardiovascular:        HTN  Gastrointestinal:  Positive for constipation.  Endocrine: Negative.   Genitourinary:        Kidney stones  Musculoskeletal:  Positive for arthralgias.  Skin: Negative.   Allergic/Immunologic: Negative.   Neurological: Negative.   Hematological: Negative.   Psychiatric/Behavioral:  Positive for sleep disturbance.     Objective:  Physical Exam Constitutional:      Appearance: Normal appearance. He is normal weight.  HENT:     Head: Normocephalic.     Nose: Nose normal.  Eyes:     Pupils: Pupils are equal, round, and reactive to light.  Cardiovascular:     Pulses: Normal pulses.  Pulmonary:     Effort: Pulmonary effort is normal.  Musculoskeletal:        General: Tenderness present.     Cervical back: Normal range of motion and neck supple.     Comments: the patient has increased pain with attempts of internal rotation of the right hip.  Overall good strength bilaterally.  Patient did  show me the area of his rash in his groin and there is no concern today.  Skin:    General: Skin is warm and dry.  Neurological:     General: No focal deficit present.     Mental Status: He is alert and oriented to person, place, and time. Mental status is at baseline.  Psychiatric:        Mood and Affect: Mood normal.        Behavior: Behavior normal.        Thought Content: Thought content normal.        Judgment: Judgment normal.     Vital signs in last 24 hours:    Labs:   Estimated body mass index is 36.05 kg/m as  calculated from the following:   Height as of 04/17/24: 5' 4 (1.626 m).   Weight as of 04/17/24: 95.3 kg.   Imaging Review Plain radiographs demonstrate  bone-on-bone arthritis to the superior weightbearing dome of the right hip peripheral osteophytes and subchondral cysts in both the acetabulum and the femoral head.    Assessment/Plan:  End stage arthritis, right hip(s)  The patient history, physical examination, clinical judgement of the provider and imaging studies are consistent with end stage degenerative joint disease of the right hip(s) and total hip arthroplasty is deemed medically necessary. The treatment options including medical management, injection therapy, arthroscopy and arthroplasty were discussed at length. The risks and benefits of total hip arthroplasty were presented and reviewed. The risks due to aseptic loosening, infection, stiffness, dislocation/subluxation,  thromboembolic complications and other imponderables were discussed.  The patient acknowledged the explanation, agreed to proceed with the plan and consent was signed. Patient is being admitted for inpatient treatment for surgery, pain control, PT, OT, prophylactic antibiotics, VTE prophylaxis, progressive ambulation and ADL's and discharge planning.The patient is planning to be discharged home with home health services    Patient's anticipated LOS is less than 2 midnights, meeting these  requirements: - Younger than 47 - Lives within 1 hour of care - Has a competent adult at home to recover with post-op recover - NO history of  - Chronic pain requiring opiods  - Diabetes  - Coronary Artery Disease  - Heart failure  - Heart attack  - Stroke  - DVT/VTE  - Cardiac arrhythmia  - Respiratory Failure/COPD  - Renal failure  - Anemia  - Advanced Liver disease

## 2024-04-27 NOTE — Anesthesia Preprocedure Evaluation (Signed)
 Anesthesia Evaluation  Patient identified by MRN, date of birth, ID band Patient awake    Reviewed: Allergy & Precautions, H&P , NPO status , Patient's Chart, lab work & pertinent test results, reviewed documented beta blocker date and time   Airway Mallampati: IV  TM Distance: >3 FB Neck ROM: Full    Dental  (+) Teeth Intact, Dental Advisory Given   Pulmonary asthma (well controlled) , sleep apnea (hasnt used cpap in a few years)    Pulmonary exam normal breath sounds clear to auscultation       Cardiovascular hypertension (144/74 preop), Pt. on medications and Pt. on home beta blockers Normal cardiovascular exam Rhythm:Regular Rate:Normal  Echo 2020  1. The left ventricle has normal systolic function with an ejection  fraction of 60-65%. The cavity size was normal. There is mild concentric  left ventricular hypertrophy. Left ventricular diastolic Doppler  parameters are consistent with impaired  relaxation.   2. The right ventricle has normal systolic function. The cavity was  normal. There is no increase in right ventricular wall thickness.   3. No evidence of mitral valve stenosis.   4. The aortic valve is tricuspid. No stenosis of the aortic valve.   5. The ascending aorta and aortic root are normal in size and structure.     Neuro/Psych  Headaches TIA negative psych ROS   GI/Hepatic negative GI ROS, Neg liver ROS,,,  Endo/Other  Obesity BMI 36  Renal/GU negative Renal ROS  negative genitourinary   Musculoskeletal  (+) Arthritis , Osteoarthritis,    Abdominal  (+) + obese  Peds negative pediatric ROS (+)  Hematology negative hematology ROS (+) Hb 15, plt 157   Anesthesia Other Findings   Reproductive/Obstetrics negative OB ROS                              Anesthesia Physical Anesthesia Plan  ASA: 2  Anesthesia Plan: Spinal and MAC   Post-op Pain Management: Tylenol  PO  (pre-op)*   Induction:   PONV Risk Score and Plan: 2 and Propofol  infusion and TIVA  Airway Management Planned: Natural Airway and Nasal Cannula  Additional Equipment: None  Intra-op Plan:   Post-operative Plan:   Informed Consent: I have reviewed the patients History and Physical, chart, labs and discussed the procedure including the risks, benefits and alternatives for the proposed anesthesia with the patient or authorized representative who has indicated his/her understanding and acceptance.       Plan Discussed with: CRNA  Anesthesia Plan Comments: (L THR 2020 under spinal, no issues.)         Anesthesia Quick Evaluation

## 2024-04-29 ENCOUNTER — Encounter (HOSPITAL_COMMUNITY): Payer: Self-pay | Admitting: Anesthesiology

## 2024-04-29 ENCOUNTER — Ambulatory Visit (HOSPITAL_COMMUNITY)
Admission: RE | Admit: 2024-04-29 | Discharge: 2024-04-30 | Disposition: A | Discharge status: Home / Self Care | Attending: Orthopedic Surgery | Admitting: Orthopedic Surgery

## 2024-04-29 ENCOUNTER — Other Ambulatory Visit: Payer: Self-pay

## 2024-04-29 ENCOUNTER — Encounter (HOSPITAL_COMMUNITY): Payer: Self-pay | Admitting: Orthopedic Surgery

## 2024-04-29 ENCOUNTER — Ambulatory Visit (HOSPITAL_BASED_OUTPATIENT_CLINIC_OR_DEPARTMENT_OTHER): Payer: Self-pay | Admitting: Anesthesiology

## 2024-04-29 ENCOUNTER — Ambulatory Visit (HOSPITAL_COMMUNITY)

## 2024-04-29 ENCOUNTER — Encounter (HOSPITAL_COMMUNITY)
Admission: RE | Disposition: A | Discharge status: Home / Self Care | Payer: Self-pay | Source: Home / Self Care | Attending: Orthopedic Surgery

## 2024-04-29 DIAGNOSIS — Z96641 Presence of right artificial hip joint: Secondary | ICD-10-CM | POA: Diagnosis present

## 2024-04-29 DIAGNOSIS — M1611 Unilateral primary osteoarthritis, right hip: Secondary | ICD-10-CM | POA: Diagnosis present

## 2024-04-29 DIAGNOSIS — G4733 Obstructive sleep apnea (adult) (pediatric): Secondary | ICD-10-CM | POA: Insufficient documentation

## 2024-04-29 DIAGNOSIS — J4489 Other specified chronic obstructive pulmonary disease: Secondary | ICD-10-CM | POA: Diagnosis not present

## 2024-04-29 DIAGNOSIS — Z96643 Presence of artificial hip joint, bilateral: Secondary | ICD-10-CM | POA: Diagnosis not present

## 2024-04-29 DIAGNOSIS — I10 Essential (primary) hypertension: Secondary | ICD-10-CM | POA: Insufficient documentation

## 2024-04-29 HISTORY — PX: TOTAL HIP ARTHROPLASTY: SHX124

## 2024-04-29 SURGERY — ARTHROPLASTY, HIP, TOTAL, ANTERIOR APPROACH
Anesthesia: Monitor Anesthesia Care | Site: Hip | Laterality: Right

## 2024-04-29 MED ORDER — TRANEXAMIC ACID 1000 MG/10ML IV SOLN
INTRAVENOUS | Status: DC | PRN
  Administered 2024-04-29: 2000 mg via TOPICAL

## 2024-04-29 MED ORDER — ACETAMINOPHEN 500 MG PO TABS
1000.0000 mg | ORAL_TABLET | Freq: Once | ORAL | Status: AC
  Administered 2024-04-29: 1000 mg via ORAL
  Filled 2024-04-29: qty 2

## 2024-04-29 MED ORDER — PROPOFOL 500 MG/50ML IV EMUL: INTRAVENOUS | Status: DC | PRN

## 2024-04-29 MED ORDER — FENTANYL CITRATE PF 50 MCG/ML IJ SOSY: 25.0000 ug | PREFILLED_SYRINGE | INTRAMUSCULAR | Status: DC | PRN

## 2024-04-29 MED ORDER — LACTATED RINGERS IV SOLN: INTRAVENOUS | Status: DC

## 2024-04-29 MED ORDER — POLYETHYL GLYCOL-PROPYL GLYCOL 0.4-0.3 % OP SOLN: 1.0000 [drp] | Freq: Two times a day (BID) | OPHTHALMIC | Status: DC | PRN

## 2024-04-29 MED ORDER — OXYCODONE HCL 5 MG PO TABS
5.0000 mg | ORAL_TABLET | Freq: Once | ORAL | Status: AC | PRN
  Administered 2024-04-29: 5 mg via ORAL

## 2024-04-29 MED ORDER — ATENOLOL 50 MG PO TABS
100.0000 mg | ORAL_TABLET | Freq: Every morning | ORAL | Status: DC
  Administered 2024-04-30: 100 mg via ORAL
  Filled 2024-04-29: qty 2

## 2024-04-29 MED ORDER — OXYCODONE HCL 5 MG/5ML PO SOLN: 5.0000 mg | Freq: Once | ORAL | Status: AC | PRN

## 2024-04-29 MED ORDER — LIDOCAINE 2% (20 MG/ML) 5 ML SYRINGE
INTRAMUSCULAR | Status: DC | PRN
Start: 2024-04-29 — End: 2024-04-29
  Administered 2024-04-29: 40 mg via INTRAVENOUS

## 2024-04-29 MED ORDER — ACETAMINOPHEN 325 MG PO TABS: 325.0000 mg | ORAL_TABLET | Freq: Four times a day (QID) | ORAL | Status: DC | PRN

## 2024-04-29 MED ORDER — SODIUM CHLORIDE 0.9 % IV SOLN
INTRAVENOUS | Status: DC | PRN
  Administered 2024-04-29: 90 mL

## 2024-04-29 MED ORDER — ASPIRIN 81 MG PO TBEC: 81.0000 mg | DELAYED_RELEASE_TABLET | Freq: Two times a day (BID) | ORAL | 0 refills | Status: AC

## 2024-04-29 MED ORDER — ORAL CARE MOUTH RINSE: 15.0000 mL | Freq: Once | OROMUCOSAL | Status: AC

## 2024-04-29 MED ORDER — NYSTATIN 100000 UNIT/GM EX OINT: 1.0000 | TOPICAL_OINTMENT | Freq: Every day | CUTANEOUS | Status: DC | PRN

## 2024-04-29 MED ORDER — ASPIRIN 81 MG PO CHEW
81.0000 mg | CHEWABLE_TABLET | Freq: Two times a day (BID) | ORAL | Status: DC
  Administered 2024-04-29 – 2024-04-30 (×2): 81 mg via ORAL
  Filled 2024-04-29 (×2): qty 1

## 2024-04-29 MED ORDER — AMISULPRIDE (ANTIEMETIC) 5 MG/2ML IV SOLN: 10.0000 mg | Freq: Once | INTRAVENOUS | Status: DC | PRN

## 2024-04-29 MED ORDER — OXYCODONE HCL 5 MG PO TABS
ORAL_TABLET | ORAL | Status: AC
  Filled 2024-04-29: qty 1

## 2024-04-29 MED ORDER — METHOCARBAMOL 1000 MG/10ML IJ SOLN: 500.0000 mg | Freq: Four times a day (QID) | INTRAMUSCULAR | Status: DC | PRN

## 2024-04-29 MED ORDER — ACETAMINOPHEN 500 MG PO TABS
1000.0000 mg | ORAL_TABLET | Freq: Four times a day (QID) | ORAL | Status: AC
  Administered 2024-04-29 – 2024-04-30 (×4): 1000 mg via ORAL
  Filled 2024-04-29 (×4): qty 2

## 2024-04-29 MED ORDER — BSS IO SOLN
15.0000 mL | Freq: Once | INTRAOCULAR | Status: AC
  Administered 2024-04-29: 15 mL
  Filled 2024-04-29: qty 15

## 2024-04-29 MED ORDER — DEXAMETHASONE SODIUM PHOSPHATE 10 MG/ML IJ SOLN
10.0000 mg | Freq: Once | INTRAMUSCULAR | Status: AC
  Administered 2024-04-30: 10 mg via INTRAVENOUS
  Filled 2024-04-29: qty 1

## 2024-04-29 MED ORDER — FENTANYL CITRATE (PF) 100 MCG/2ML IJ SOLN
INTRAMUSCULAR | Status: AC
  Filled 2024-04-29: qty 2

## 2024-04-29 MED ORDER — PROPOFOL 10 MG/ML IV BOLUS
INTRAVENOUS | Status: DC | PRN
  Administered 2024-04-29 (×2): 20 mg via INTRAVENOUS

## 2024-04-29 MED ORDER — BUPIVACAINE-EPINEPHRINE (PF) 0.25% -1:200000 IJ SOLN
INTRAMUSCULAR | Status: AC
  Filled 2024-04-29: qty 30

## 2024-04-29 MED ORDER — ONDANSETRON HCL 4 MG/2ML IJ SOLN: 4.0000 mg | Freq: Four times a day (QID) | INTRAMUSCULAR | Status: DC | PRN

## 2024-04-29 MED ORDER — PANTOPRAZOLE SODIUM 40 MG PO TBEC
40.0000 mg | DELAYED_RELEASE_TABLET | Freq: Every day | ORAL | Status: DC
  Administered 2024-04-29 – 2024-04-30 (×2): 40 mg via ORAL
  Filled 2024-04-29: qty 2
  Filled 2024-04-29: qty 1

## 2024-04-29 MED ORDER — PHENYLEPHRINE HCL (PRESSORS) 10 MG/ML IV SOLN
INTRAVENOUS | Status: DC | PRN
  Administered 2024-04-29: 160 ug via INTRAVENOUS

## 2024-04-29 MED ORDER — SODIUM CHLORIDE (PF) 0.9 % IJ SOLN
INTRAMUSCULAR | Status: AC
  Filled 2024-04-29: qty 50

## 2024-04-29 MED ORDER — PHENYLEPHRINE HCL-NACL 20-0.9 MG/250ML-% IV SOLN
INTRAVENOUS | Status: DC | PRN
  Administered 2024-04-29: 40 ug/min via INTRAVENOUS

## 2024-04-29 MED ORDER — TRANEXAMIC ACID 1000 MG/10ML IV SOLN
2000.0000 mg | INTRAVENOUS | Status: DC
  Filled 2024-04-29: qty 20

## 2024-04-29 MED ORDER — PHENYLEPHRINE HCL (PRESSORS) 10 MG/ML IV SOLN
INTRAVENOUS | Status: AC
  Filled 2024-04-29: qty 1

## 2024-04-29 MED ORDER — BISACODYL 5 MG PO TBEC: 5.0000 mg | DELAYED_RELEASE_TABLET | Freq: Every day | ORAL | Status: DC | PRN

## 2024-04-29 MED ORDER — PROPOFOL 500 MG/50ML IV EMUL
INTRAVENOUS | Status: DC | PRN
  Administered 2024-04-29: 50 ug/kg/min via INTRAVENOUS

## 2024-04-29 MED ORDER — OXYCODONE HCL 5 MG PO TABS
5.0000 mg | ORAL_TABLET | ORAL | Status: DC | PRN
  Administered 2024-04-29: 5 mg via ORAL
  Administered 2024-04-29 – 2024-04-30 (×3): 10 mg via ORAL
  Filled 2024-04-29 (×3): qty 2
  Filled 2024-04-29: qty 1

## 2024-04-29 MED ORDER — HYDROCORTISONE 1 % EX LOTN: TOPICAL_LOTION | Freq: Two times a day (BID) | CUTANEOUS | Status: DC | PRN

## 2024-04-29 MED ORDER — HYDROMORPHONE HCL 1 MG/ML IJ SOLN: 0.5000 mg | INTRAMUSCULAR | Status: DC | PRN

## 2024-04-29 MED ORDER — TRANEXAMIC ACID-NACL 1000-0.7 MG/100ML-% IV SOLN
1000.0000 mg | Freq: Once | INTRAVENOUS | Status: AC
  Administered 2024-04-29: 1000 mg via INTRAVENOUS
  Filled 2024-04-29: qty 100

## 2024-04-29 MED ORDER — HYDROCORTISONE 2.5 % EX LOTN
1.0000 | TOPICAL_LOTION | Freq: Two times a day (BID) | CUTANEOUS | Status: DC | PRN
Start: 2024-04-29 — End: 2024-04-29

## 2024-04-29 MED ORDER — ONDANSETRON HCL 4 MG PO TABS: 4.0000 mg | ORAL_TABLET | Freq: Four times a day (QID) | ORAL | Status: DC | PRN

## 2024-04-29 MED ORDER — ONDANSETRON HCL 4 MG/2ML IJ SOLN: 4.0000 mg | Freq: Once | INTRAMUSCULAR | Status: DC | PRN

## 2024-04-29 MED ORDER — MENTHOL 3 MG MT LOZG: 1.0000 | LOZENGE | OROMUCOSAL | Status: DC | PRN

## 2024-04-29 MED ORDER — TRAMADOL HCL 50 MG PO TABS
50.0000 mg | ORAL_TABLET | Freq: Four times a day (QID) | ORAL | Status: DC
  Filled 2024-04-29 (×3): qty 1

## 2024-04-29 MED ORDER — METHOCARBAMOL 500 MG PO TABS
500.0000 mg | ORAL_TABLET | Freq: Four times a day (QID) | ORAL | Status: DC | PRN
  Administered 2024-04-29: 500 mg via ORAL
  Filled 2024-04-29: qty 1

## 2024-04-29 MED ORDER — ALUM & MAG HYDROXIDE-SIMETH 200-200-20 MG/5ML PO SUSP: 30.0000 mL | ORAL | Status: DC | PRN

## 2024-04-29 MED ORDER — CEFAZOLIN SODIUM-DEXTROSE 2-4 GM/100ML-% IV SOLN
2.0000 g | INTRAVENOUS | Status: AC
  Administered 2024-04-29: 2 g via INTRAVENOUS
  Filled 2024-04-29: qty 100

## 2024-04-29 MED ORDER — FENTANYL CITRATE (PF) 100 MCG/2ML IJ SOLN
INTRAMUSCULAR | Status: DC | PRN
  Administered 2024-04-29: 100 ug via INTRAVENOUS

## 2024-04-29 MED ORDER — TIZANIDINE HCL 2 MG PO TABS: 2.0000 mg | ORAL_TABLET | Freq: Four times a day (QID) | ORAL | 0 refills | Status: AC | PRN

## 2024-04-29 MED ORDER — PROPOFOL 10 MG/ML IV BOLUS
INTRAVENOUS | Status: AC
  Filled 2024-04-29: qty 20

## 2024-04-29 MED ORDER — PHENOL 1.4 % MT LIQD: 1.0000 | OROMUCOSAL | Status: DC | PRN

## 2024-04-29 MED ORDER — BUPIVACAINE LIPOSOME 1.3 % IJ SUSP
INTRAMUSCULAR | Status: AC
  Filled 2024-04-29: qty 10

## 2024-04-29 MED ORDER — FLEET ENEMA RE ENEM: 1.0000 | ENEMA | Freq: Once | RECTAL | Status: DC | PRN

## 2024-04-29 MED ORDER — PHENYLEPHRINE 80 MCG/ML (10ML) SYRINGE FOR IV PUSH (FOR BLOOD PRESSURE SUPPORT)
PREFILLED_SYRINGE | INTRAVENOUS | Status: AC
  Filled 2024-04-29: qty 20

## 2024-04-29 MED ORDER — TAMSULOSIN HCL 0.4 MG PO CAPS
0.8000 mg | ORAL_CAPSULE | Freq: Every morning | ORAL | Status: DC
  Administered 2024-04-30: 0.8 mg via ORAL
  Filled 2024-04-29: qty 2

## 2024-04-29 MED ORDER — BUPIVACAINE LIPOSOME 1.3 % IJ SUSP: 10.0000 mL | Freq: Once | INTRAMUSCULAR | Status: DC

## 2024-04-29 MED ORDER — METOCLOPRAMIDE HCL 5 MG/ML IJ SOLN: 5.0000 mg | Freq: Three times a day (TID) | INTRAMUSCULAR | Status: DC | PRN

## 2024-04-29 MED ORDER — POVIDONE-IODINE 10 % EX SWAB: 2.0000 | Freq: Once | CUTANEOUS | Status: DC

## 2024-04-29 MED ORDER — DOCUSATE SODIUM 100 MG PO CAPS
100.0000 mg | ORAL_CAPSULE | Freq: Two times a day (BID) | ORAL | Status: DC
  Administered 2024-04-29 – 2024-04-30 (×3): 100 mg via ORAL
  Filled 2024-04-29 (×3): qty 1

## 2024-04-29 MED ORDER — CHLORHEXIDINE GLUCONATE 0.12 % MT SOLN
15.0000 mL | Freq: Once | OROMUCOSAL | Status: AC
  Administered 2024-04-29: 15 mL via OROMUCOSAL

## 2024-04-29 MED ORDER — OXYCODONE-ACETAMINOPHEN 7.5-325 MG PO TABS: 1.0000 | ORAL_TABLET | ORAL | 0 refills | Status: AC | PRN

## 2024-04-29 MED ORDER — BUPIVACAINE IN DEXTROSE 0.75-8.25 % IT SOLN
INTRATHECAL | Status: DC | PRN
  Administered 2024-04-29: 1.5 mL via INTRATHECAL

## 2024-04-29 MED ORDER — TRANEXAMIC ACID-NACL 1000-0.7 MG/100ML-% IV SOLN
1000.0000 mg | INTRAVENOUS | Status: AC
  Administered 2024-04-29: 1000 mg via INTRAVENOUS
  Filled 2024-04-29: qty 100

## 2024-04-29 MED ORDER — METOCLOPRAMIDE HCL 5 MG PO TABS: 5.0000 mg | ORAL_TABLET | Freq: Three times a day (TID) | ORAL | Status: DC | PRN

## 2024-04-29 MED ORDER — POLYVINYL ALCOHOL 1.4 % OP SOLN: 1.0000 [drp] | Freq: Two times a day (BID) | OPHTHALMIC | Status: DC | PRN

## 2024-04-29 MED ORDER — OXYCODONE HCL 5 MG PO TABS: 10.0000 mg | ORAL_TABLET | ORAL | Status: DC | PRN

## 2024-04-29 MED ORDER — OXYCODONE HCL ER 10 MG PO T12A
10.0000 mg | EXTENDED_RELEASE_TABLET | Freq: Two times a day (BID) | ORAL | Status: DC
  Administered 2024-04-29 – 2024-04-30 (×2): 10 mg via ORAL
  Filled 2024-04-29 (×2): qty 1

## 2024-04-29 MED ORDER — KCL IN DEXTROSE-NACL 20-5-0.45 MEQ/L-%-% IV SOLN
INTRAVENOUS | Status: AC
  Filled 2024-04-29 (×3): qty 1000

## 2024-04-29 MED ORDER — DEXAMETHASONE SODIUM PHOSPHATE 10 MG/ML IJ SOLN
INTRAMUSCULAR | Status: DC | PRN
  Administered 2024-04-29: 10 mg via INTRAVENOUS

## 2024-04-29 MED ORDER — 0.9 % SODIUM CHLORIDE (POUR BTL) OPTIME
TOPICAL | Status: DC | PRN
  Administered 2024-04-29: 1000 mL

## 2024-04-29 MED ORDER — KETOROLAC TROMETHAMINE 0.5 % OP SOLN
1.0000 [drp] | Freq: Four times a day (QID) | OPHTHALMIC | Status: AC
Start: 2024-04-29 — End: 2024-04-30
  Administered 2024-04-29 – 2024-04-30 (×4): 1 [drp] via OPHTHALMIC
  Filled 2024-04-29: qty 5

## 2024-04-29 MED ORDER — POLYETHYLENE GLYCOL 3350 17 G PO PACK: 17.0000 g | PACK | Freq: Every day | ORAL | Status: DC | PRN

## 2024-04-29 SURGICAL SUPPLY — 37 items
BAG COUNTER SPONGE SURGICOUNT (BAG) ×1 IMPLANT
BAG DECANTER FOR FLEXI CONT (MISCELLANEOUS) ×2 IMPLANT
BLADE SAW SGTL 18X1.27X75 (BLADE) ×1 IMPLANT
COVER PERINEAL POST (MISCELLANEOUS) ×1 IMPLANT
COVER SURGICAL LIGHT HANDLE (MISCELLANEOUS) ×1 IMPLANT
CUP ACETBLR 52 OD 100 SERIES (Hips) IMPLANT
DRAPE STERI IOBAN 125X83 (DRAPES) ×1 IMPLANT
DRAPE U-SHAPE 47X51 STRL (DRAPES) ×2 IMPLANT
DRSG AQUACEL AG ADV 3.5X10 (GAUZE/BANDAGES/DRESSINGS) ×1 IMPLANT
DURAPREP 26ML APPLICATOR (WOUND CARE) ×1 IMPLANT
ELECT BLADE TIP CTD 4 INCH (ELECTRODE) ×1 IMPLANT
ELECT PENCIL ROCKER SW 15FT (MISCELLANEOUS) IMPLANT
ELECT REM PT RETURN 15FT ADLT (MISCELLANEOUS) ×1 IMPLANT
ELIMINATOR HOLE APEX DEPUY (Hips) IMPLANT
GLOVE BIO SURGEON STRL SZ7.5 (GLOVE) ×1 IMPLANT
GLOVE BIO SURGEON STRL SZ8.5 (GLOVE) ×1 IMPLANT
GLOVE BIOGEL PI IND STRL 8 (GLOVE) ×1 IMPLANT
GLOVE BIOGEL PI IND STRL 9 (GLOVE) ×1 IMPLANT
GOWN STRL REUS W/ TWL XL LVL3 (GOWN DISPOSABLE) ×2 IMPLANT
HEAD M SROM 36MM PLUS 1.5 (Hips) IMPLANT
HOLDER FOLEY CATH W/STRAP (MISCELLANEOUS) ×1 IMPLANT
KIT TURNOVER KIT A (KITS) ×1 IMPLANT
LINER NEUTRAL 52X36MM PLUS 4 (Liner) IMPLANT
MANIFOLD NEPTUNE II (INSTRUMENTS) ×1 IMPLANT
NDL HYPO 21X1.5 SAFETY (NEEDLE) ×2 IMPLANT
NEEDLE HYPO 21X1.5 SAFETY (NEEDLE) ×2 IMPLANT
NS IRRIG 1000ML POUR BTL (IV SOLUTION) ×1 IMPLANT
PACK ANTERIOR HIP CUSTOM (KITS) ×1 IMPLANT
SPIKE FLUID TRANSFER (MISCELLANEOUS) ×1 IMPLANT
STEM FEM ACTIS HIGH SZ7 (Stem) IMPLANT
SUT VIC AB 0 CT1 36 (SUTURE) ×1 IMPLANT
SUT VIC AB 1 CTX36XBRD ANBCTR (SUTURE) ×1 IMPLANT
SUT VIC AB 2-0 CT1 TAPERPNT 27 (SUTURE) ×1 IMPLANT
SUT VICRYL+ 3-0 36IN CT-1 (SUTURE) ×1 IMPLANT
SYR CONTROL 10ML LL (SYRINGE) ×3 IMPLANT
TRAY FOLEY MTR SLVR 16FR STAT (SET/KITS/TRAYS/PACK) IMPLANT
TUBE SUCTION HIGH CAP CLEAR NV (SUCTIONS) ×1 IMPLANT

## 2024-04-29 NOTE — Transfer of Care (Signed)
 Immediate Anesthesia Transfer of Care Note  Patient: Franklin Fisher  Procedure(s) Performed: ARTHROPLASTY, HIP, TOTAL, ANTERIOR APPROACH (Right: Hip)  Patient Location: PACU  Anesthesia Type:Spinal  Level of Consciousness: sedated, patient cooperative, and responds to stimulation  Airway & Oxygen Therapy: Patient Spontanous Breathing and Patient connected to face mask oxygen  Post-op Assessment: Report given to RN and Post -op Vital signs reviewed and stable  Post vital signs: Reviewed and stable  Last Vitals:  Vitals Value Taken Time  BP 106/62 04/29/24 11:04  Temp    Pulse 80 04/29/24 11:06  Resp    SpO2 100 % 04/29/24 11:06  Vitals shown include unfiled device data.  Last Pain:  Vitals:   04/29/24 0726  TempSrc:   PainSc: 0-No pain         Complications: No notable events documented.

## 2024-04-29 NOTE — Anesthesia Postprocedure Evaluation (Signed)
 Anesthesia Post Note  Patient: KAJUAN GUYTON  Procedure(s) Performed: ARTHROPLASTY, HIP, TOTAL, ANTERIOR APPROACH (Right: Hip)     Patient location during evaluation: PACU Anesthesia Type: MAC and Spinal Level of consciousness: awake and alert and oriented Pain management: pain level controlled Vital Signs Assessment: post-procedure vital signs reviewed and stable Respiratory status: spontaneous breathing, nonlabored ventilation and respiratory function stable Cardiovascular status: blood pressure returned to baseline and stable Postop Assessment: no headache, no backache, spinal receding and patient able to bend at knees Anesthetic complications: no   No notable events documented.  Last Vitals:  Vitals:   04/29/24 1200 04/29/24 1215  BP: (!) 117/57 112/63  Pulse: (!) 57 (!) 54  Resp:    Temp:    SpO2: 97% 97%    Last Pain:  Vitals:   04/29/24 1130  TempSrc:   PainSc: 0-No pain                 Almarie CHRISTELLA Marchi

## 2024-04-29 NOTE — Op Note (Signed)
 PATIENT ID:      Franklin Fisher  MRN:     [id number] DOB/AGE:    03/17/1941 / 83 y.o.  OPERATIVE REPORT   DATE OF PROCEDURE:  04/29/2024      PREOPERATIVE DIAGNOSIS:  RIGHT HIP OSTEOARTHRITIS                                                         POSTOPERATIVE DIAGNOSIS:  Same                                                         PROCEDURE: Anterior R total hip arthroplasty using a 52 mm DePuy Pinnacle  Cup, [employer], 0-degree polyethylene liner, a +1.5 mm x 36mm metal head, a 7 hi Depuy Actis stem  SURGEON: [name]  ASSISTANT:   Camellia POUR. [employer]  (present throughout entire procedure and necessary for timely completion of the procedure)   ANESTHESIA: Spinal, Exparel  133mg  injection BLOOD LOSS: 300 cc FLUID REPLACEMENT: 1500 cc crystalloid TRANEXAMIC ACID : 1gm IV, 2gm Topical COMPLICATIONS: none    INDICATIONS FOR PROCEDURE: A 83 y.o. year-old With  RIGHT HIP OSTEOARTHRITIS   for 3 years, x-rays show bone-on-bone arthritic changes, and osteophytes. Despite conservative measures with observation, anti-inflammatory medicine, narcotics, use of a cane, has severe unremitting pain and can ambulate only a few blocks before resting. Patient desires elective R total hip arthroplasty to decrease pain and increase function. The risks, benefits, and alternatives were discussed at length including but not limited to the risks of infection, bleeding, nerve injury, stiffness, blood clots, the need for revision surgery, cardiopulmonary complications, among others, and they were willing to proceed. Questions answered      PROCEDURE IN DETAIL: The patient was identified by armband, received preoperative IV antibiotics, in the holding area at [facility], taken to the operating room , appropriate anesthetic monitors were attached and anesthesia was induced with the patient on the gurney. HANA boots were applied to the feet, and the patient  was transferred to the HANA table  with a peroneal post and support underneath the non-operative leg. The operative lower extremity was then prepped and draped in the usual sterile fashion from just above the iliac crest to the knee. And a timeout procedure was performed. Camellia POUR. [facility] was present and scrubbed throughout the case, critical for assistance with, positioning, exposure, retraction, instrumentation, and closure.Skin along incision area was injected with 10 cc of Exparel  solution. We then made a 12 cm incision along the interval at the leading edge of the tensor fascia lata of starting at 2 cm lateral to the ASIS. Small bleeders in the skin and subcutaneous tissue identified and cauterized we dissected down to the fascia and made an incision in the fascia allowing us  to elevate the fascia of the tensor muscle and exploited the interval between the rectus and the tensor fascia lata. A Cobra retractor was then placed along the superior neck of the femur. A cerebellar retractor was used to expose the interval between the tensor fascia lata and the rectus femoris.  We identified and cauterized the  ascending branch of the anterior circumflex artery. A second Cobra retractor along the inferior neck of the femur. A small Hohmann retractor was placed underneath the origin of the rectus femoris, giving us  good medial exposure. Using Ronguers fatty tissue was removed from in front of the anterior capsule. The capsule was then incised, starting out at the superior anterior rim of the acetabulum going laterally along the anterior neck. The capsule was then teed along the neck superiorly and inferiorly. Electrocautery was used to release capsule from the anterior and medial neck of the femur to allow external rotation. The cobra retractors were then placed along the inferior and superior neck. The hip was externally rotated to 40 degrees, traction applied and locked. We perform a standard neck cut with the oscillating saw and removed the femoral  head with a power corkscrew. We then placed a medium curved medium homan retractor in the cotyloid notch and standard cobra retractor posteriorly along the acetabular rim. Exposed labral tissue and osteophytes were then removed. We then sequentially reamed up to a 51 mm basket reamer obtaining good coverage in all quadrants, verified by C-arm imaging. Under C-arm control we then hammered into place a 52 mm Pinnacle cup in 45 of abduction and 15 of anteversion. The cup seated nicely and required no supplemental screws. We then placed a central hole Eliminator and a +4 mm, 0 polyethylene liner. The foot was then externally rotated to 130-140. The limb was extended and adducted to the floor, delivering the proximal femur up into the wound. A medium curved Hohmann retractor was placed over the greater trochanter and a long Homan retractor along the posterior femoral neck completing the exposure and lateralizing the femur. We then performed releases superiorly and and inferiorly of the capsule going back to the pirformis fossa superiorly and to the lesser trochanter inferiorly. We then entered the proximal femur with the box cutting offset chisel followed by, a canal sounder, the chili pepper and broaching up to a 7 broach. This seated nicely and we reamed the calcar. A trial reduction was performed with a 1.5 mm X 36 mm head.The limb lengths were checked by c-arm, and the hip was stable in 90 of external rotation. At this point the trial components removed and we hammered into place a hi  Offset # 7Actis stem with coating. A + 1.5 mm x 36 metalhead was then hammered into place. The hip was reduced and final C-arm images obtained. The wound was thoroughly irrigated with normal saline solution. We repaired the ant capsule and the tensor fascia lot a with running 0 vicryl suture. the subcutaneous and subcuticular layers were closed with running 3-0 Vicryl suture followed by an Aquacil dressing. At this point the  patient was awaken and transferred to hospital gurney without difficulty.   [name] 04/29/2024, 8:26 AM

## 2024-04-29 NOTE — Progress Notes (Signed)
 Patient reports no issues with topical betadine /iodine 

## 2024-04-29 NOTE — Interval H&P Note (Signed)
 History and Physical Interval Note:  04/29/2024 8:25 AM  Franklin Fisher  has presented today for surgery, with the diagnosis of RIGHT HIP OSTEOARTHRITIS.  The various methods of treatment have been discussed with the patient and family. After consideration of risks, benefits and other options for treatment, the patient has consented to  Procedure(s): ARTHROPLASTY, HIP, TOTAL, ANTERIOR APPROACH (Right) as a surgical intervention.  The patient's history has been reviewed, patient examined, no change in status, stable for surgery.  I have reviewed the patient's chart and labs.  Questions were answered to the patient's satisfaction.     [name]

## 2024-04-29 NOTE — Plan of Care (Signed)

## 2024-04-29 NOTE — Discharge Instructions (Signed)

## 2024-04-29 NOTE — Evaluation (Signed)
 Physical Therapy Evaluation Patient Details Name: [facility] MRN: [id number] DOB: 1941-02-23 Today's Date: 04/29/2024  History of Present Illness  83 yo male s/p R THA on 04/29/24. PMH: tinnitus L  ear, back pain, L THA 2020, HTN, OSA  Clinical Impression  Pt is s/p THA resulting in the deficits listed below (see PT Problem List).  Pt reports independence at baseline. +2 mod-max assist for supine <>sit today, standing not attempted d/t slow regression of anesthesia. Pt reports sleeping in recliner frequently, routinely uses AE for getting LEs on to bed and for LB dressing. Potential for incr acute LOS.   Pt will benefit from acute skilled PT to increase their independence and safety with mobility to facilitate discharge.        If plan is discharge home, recommend the following:     Can travel by private vehicle        Equipment Recommendations None recommended by PT  Recommendations for Other Services       Functional Status Assessment Patient has had a recent decline in their functional status and demonstrates the ability to make significant improvements in function in a reasonable and predictable amount of time.     Precautions / Restrictions Precautions Precautions: Fall Restrictions Weight Bearing Restrictions Per Provider Order: No RLE Weight Bearing Per Provider Order: Weight bearing as tolerated      Mobility  Bed Mobility Overal bed mobility: Needs Assistance Bed Mobility: Supine to Sit, Sit to Supine     Supine to sit: Mod assist Sit to supine: +2 for physical assistance, +2 for safety/equipment, Mod assist, Max assist   General bed mobility comments: assist to progress LEs off bed and elevate trunk. +2 max assist to lower trunk, lift LEs and reposition in bed; mod-max assist to scoot laterally along EOB; standign deferred d/t slow regression of anesthesia    Transfers                        Ambulation/Gait                   Stairs            Wheelchair Mobility     Tilt Bed    Modified Rankin (Stroke Patients Only)       Balance Overall balance assessment: Needs assistance Sitting-balance support: Feet supported, No upper extremity supported Sitting balance-Leahy Scale: Fair         Standing balance comment: NT                             Pertinent Vitals/Pain Pain Assessment Pain Assessment: 0-10 Pain Score: 5  Pain Location: right hip Pain Descriptors / Indicators: Sore Pain Intervention(s): Limited activity within patient's tolerance, Monitored during session, Premedicated before session, Repositioned    Home Living Family/patient expects to be discharged to:: Private residence Living Arrangements: Alone Available Help at Discharge: Available PRN/intermittently (pt reports he has hired assist; his girlfriend cannot physically assist) Type of Home: Apartment Home Access: Level entry       Home Layout: One level Home Equipment: Adaptive equipment Additional Comments: leg lifter    Prior Function Prior Level of Function : Independent/Modified Independent             Mobility Comments: pt sleeps in recliner often ADLs Comments: ind with AE     Extremity/Trunk Assessment   Upper Extremity Assessment Upper Extremity Assessment: Defer  to OT evaluation    Lower Extremity Assessment Lower Extremity Assessment: LLE deficits/detail;RLE deficits/detail RLE Deficits / Details: AAROM grossly WFL, MMT limited by pain, grossly 2+/5 RLE: Unable to fully assess due to pain RLE Sensation: history of peripheral neuropathy LLE Deficits / Details: AAROM WFL, knee and hip grossly 3/5, possibly limited by slow regression of anesthesia LLE Sensation: history of peripheral neuropathy LLE Coordination: decreased gross motor       Communication   Communication Communication: No apparent difficulties    Cognition Arousal: Alert Behavior During Therapy: WFL for  tasks assessed/performed   PT - Cognitive impairments: No apparent impairments                         Following commands: Intact       Cueing Cueing Techniques: Verbal cues     General Comments      Exercises Total Joint Exercises Ankle Circles/Pumps: AROM, Both, 5 reps   Assessment/Plan    PT Assessment Patient needs continued PT services  PT Problem List Decreased strength;Decreased mobility;Decreased range of motion;Decreased activity tolerance;Decreased balance;Decreased knowledge of use of DME;Pain       PT Treatment Interventions DME instruction;Therapeutic exercise;Gait training;Functional mobility training;Therapeutic activities;Patient/family education;Balance training    PT Goals (Current goals can be found in the Care Plan section)  Acute Rehab PT Goals PT Goal Formulation: With patient Time For Goal Achievement: 05/13/24 Potential to Achieve Goals: Fair    Frequency 7X/week     Co-evaluation               AM-PAC PT 6 Clicks Mobility  Outcome Measure Help needed turning from your back to your side while in a flat bed without using bedrails?: Total Help needed moving from lying on your back to sitting on the side of a flat bed without using bedrails?: Total Help needed moving to and from a bed to a chair (including a wheelchair)?: Total Help needed standing up from a chair using your arms (e.g., wheelchair or bedside chair)?: Total Help needed to walk in hospital room?: Total Help needed climbing 3-5 steps with a railing? : Total 6 Click Score: 6    End of Session Equipment Utilized During Treatment: Gait belt Activity Tolerance: Patient tolerated treatment well Patient left: with call bell/phone within reach;in bed;with bed alarm set Nurse Communication: Mobility status PT Visit Diagnosis: Other abnormalities of gait and mobility (R26.89)    Time: 8495-8467 PT Time Calculation (min) (ACUTE ONLY): 28 min   Charges:   PT  Evaluation $PT Eval Low Complexity: 1 Low PT Treatments $Therapeutic Activity: 8-22 mins PT General Charges $$ ACUTE PT VISIT: 1 Visit         Rexene, PT  Acute Rehab Dept [facility]) [phone]  04/29/2024   [facility] 04/29/2024, 3:46 PM

## 2024-04-29 NOTE — Anesthesia Procedure Notes (Signed)
 Spinal  Patient location during procedure: OR Start time: 04/29/2024 9:30 AM End time: 04/29/2024 9:34 AM Staffing Performed: resident/CRNA  Resident/CRNA: Cindie Charleen PARAS, CRNA Performed by: Cindie Charleen PARAS, CRNA Authorized by: Merla Almarie HERO, DO   Preanesthetic Checklist Completed: patient identified, IV checked, site marked, risks and benefits discussed, surgical consent, monitors and equipment checked, pre-op evaluation and timeout performed Spinal Block Patient position: sitting Prep: ChloraPrep Patient monitoring: heart rate, continuous pulse ox and blood pressure Location: L4-5 Injection technique: single-shot Needle Needle type: Pencan  Needle gauge: 24 G Needle length: 9 cm Needle insertion depth: 7 cm Assessment Events: CSF return Additional Notes Pt sitting position, sterile prep and drape, negative paresthesia/heme

## 2024-04-30 ENCOUNTER — Encounter (HOSPITAL_COMMUNITY): Payer: Self-pay | Admitting: Orthopedic Surgery

## 2024-04-30 DIAGNOSIS — J4489 Other specified chronic obstructive pulmonary disease: Secondary | ICD-10-CM | POA: Diagnosis not present

## 2024-04-30 DIAGNOSIS — M1611 Unilateral primary osteoarthritis, right hip: Secondary | ICD-10-CM | POA: Diagnosis not present

## 2024-04-30 DIAGNOSIS — G4733 Obstructive sleep apnea (adult) (pediatric): Secondary | ICD-10-CM | POA: Diagnosis not present

## 2024-04-30 DIAGNOSIS — I10 Essential (primary) hypertension: Secondary | ICD-10-CM | POA: Diagnosis not present

## 2024-04-30 NOTE — Progress Notes (Signed)
 Physical Therapy Treatment Patient Details Name: [facility] MRN: [id number] DOB: 04-17-1941 Today's Date: 04/30/2024   History of Present Illness Mr. [facility] is a 83 yr old male admitted to the [facility] for a R THA on 04-29-24. PMH: tinitus of L ear, back pain, L THA in 2020, HTN, OSA    PT Comments  POD # 1 am session AxO x 3 pleasant and motivated.  Had prior THR but was 5 years ago.  Lives home alone and plans to stay at [facility].  She has no stairs to enter.  She is 83 years old and can not physically lift but she all other activities is is able including driving. Assisted OOB was difficult.  General bed mobility comments: Py required much assist for upper body due to ABD girth and min assist with R LE.  Pt plans to sleep in a recliner initially. General transfer comment: VC's on proper hand placement and to increase forward lean as he presents with posterior LEFT lean.  VC's for safety with turns and back reach reach prior to sit as Pt tends to plop.  Difficulty self scooting due to body habitus. General Gait Details: Required VC's on proper walker to self distance and safety with turns as well as back steps.  Recliner following as a precaution.  Tolerated amb 45 feet with reports 5/10 hip pain. Then returned to room to perform some TE's following HEP handout.  Instructed on proper tech, freq as well as use of ICE.   Will see pt again this afternoon to increased amb activity and complete HEP Education.   If plan is discharge home, recommend the following: A little help with bathing/dressing/bathroom   Can travel by private vehicle        Equipment Recommendations  None recommended by PT    Recommendations for Other Services       Precautions / Restrictions Precautions Precautions: Fall Restrictions Weight Bearing Restrictions Per Provider Order: No RLE Weight Bearing Per Provider Order: Weight bearing as tolerated     Mobility  Bed Mobility Overal bed mobility: Needs  Assistance Bed Mobility: Supine to Sit     Supine to sit: Mod assist, Max assist     General bed mobility comments: Py required much assist for upper body due to ABD girth and min assist with R LE.  Pt plans to sleep in a recliner initially.    Transfers Overall transfer level: Needs assistance Equipment used: Rolling walker (2 wheels) Transfers: Sit to/from Stand Sit to Stand: Contact guard assist, Min assist           General transfer comment: VC's on proper hand placement and to increase forward lean as he presents with posterior LEFT lean.  VC's for safety with turns and back reach reach prior to sit as Pt tends to plop.  Difficulty self scooting due to body habitus.    Ambulation/Gait Ambulation/Gait assistance: Contact guard assist, Min assist Gait Distance (Feet): 45 Feet Assistive device: Rolling walker (2 wheels) Gait Pattern/deviations: Step-to pattern, Decreased stance time - right Gait velocity: decreased     General Gait Details: Required VC's on proper walker to self distance and safety with turns as well as back steps.  Recliner following as a precaution.  Tolerated amb 45 feet with reports 5/10 hip pain.   Stairs             Wheelchair Mobility     Tilt Bed    Modified Rankin (Stroke Patients Only)  Balance                                            Communication Communication Communication: No apparent difficulties  Cognition Arousal: Alert Behavior During Therapy: WFL for tasks assessed/performed   PT - Cognitive impairments: No apparent impairments                       PT - Cognition Comments: AxO x 3 pleasant and motivated.  Had prior THR but was 5 years ago.  Lives home alone and plans to stay at [facility].  She has no stairs to enter.  She is 83 years old and can not physically lift but she all other activities is is able including driving. Following commands: Intact      Cueing Cueing  Techniques: Verbal cues  Exercises  Total Hip Replacement TE's following HEP Handout 10 reps ankle pumps 05 reps knee presses 05 reps heel slides 05 reps SAQ's 05 reps ABD Instructed how to use a belt loop to assist  Followed by ICE     General Comments        Pertinent Vitals/Pain Pain Assessment Pain Assessment: 0-10 Pain Score: 5  Pain Location: R hip Pain Descriptors / Indicators: Sore, Tender, Operative site guarding Pain Intervention(s): Monitored during session, Premedicated before session, Repositioned, Ice applied    Home Living Family/patient expects to be discharged to:: Private residence Living Arrangements: Alone   Type of Home: Apartment Home Access: Level entry       Home Layout: One level Home Equipment: Optometrist (2 wheels)      Prior Function            PT Goals (current goals can now be found in the care plan section) Progress towards PT goals: Progressing toward goals    Frequency    7X/week      PT Plan      Co-evaluation              AM-PAC PT 6 Clicks Mobility   Outcome Measure  Help needed turning from your back to your side while in a flat bed without using bedrails?: A Lot Help needed moving from lying on your back to sitting on the side of a flat bed without using bedrails?: A Lot Help needed moving to and from a bed to a chair (including a wheelchair)?: A Little Help needed standing up from a chair using your arms (e.g., wheelchair or bedside chair)?: A Little Help needed to walk in [facility] room?: A Little Help needed climbing 3-5 steps with a railing? : A Lot 6 Click Score: 15    End of Session Equipment Utilized During Treatment: Gait belt Activity Tolerance: Patient tolerated treatment well Patient left: with call bell/phone within reach;in chair;with chair alarm set Nurse Communication: Mobility status PT Visit Diagnosis: Other abnormalities of gait and mobility (R26.89)     Time:  9060-8991 PT Time Calculation (min) (ACUTE ONLY): 29 min  Charges:    $Gait Training: 8-22 mins $Therapeutic Exercise: 8-22 mins PT General Charges $$ ACUTE PT VISIT: 1 Visit                     Katheryn Leap  PTA Acute  Rehabilitation Services Office M-F          [phone]

## 2024-04-30 NOTE — Evaluation (Signed)
 Occupational Therapy Evaluation Patient Details Name: [facility] MRN: [id number] DOB: 22-Oct-1940 Today's Date: 04/30/2024   History of Present Illness   [facility] is a 83 yr old male admitted to the hospital for a R THA on 04-29-24. PMH: tinitus of L ear, back pain, L THA in 2020, HTN, OSA     Clinical Impressions The pt is currently presenting below his baseline level of functioning for self-care management, as he is limited by the below listed deficits (see OT problem list). During the session, he required min assist to stand using a RW, mod assist for simulated lower body dressing, and CGA for ambulating a short distance in his room using a RW. OT provided education instruction on safe ADL participation, including recommendations for his occupational performance in the home (see ADL section below). He denied having R hip pain and rest, however reported 5/10 hip pain with activity. He will benefit from further OT services to maximize his independence with self-care tasks & facilitate his safe hospital discharge.      If plan is discharge home, recommend the following:   A little help with walking and/or transfers;A little help with bathing/dressing/bathroom;Assistance with cooking/housework;Assist for transportation     Functional Status Assessment   Patient has had a recent decline in their functional status and demonstrates the ability to make significant improvements in function in a reasonable and predictable amount of time.     Equipment Recommendations   None recommended by OT     Recommendations for Other Services         Precautions/Restrictions   Precautions Precautions: Fall Restrictions Weight Bearing Restrictions Per Provider Order: Yes RLE Weight Bearing Per Provider Order: Weight bearing as tolerated     Mobility Bed Mobility        General bed mobility comments: he was received seated in the bedside chair    Transfers Overall transfer  level: Needs assistance Equipment used: Rolling walker (2 wheels) Transfers: Sit to/from Stand Sit to Stand: Min assist                  Balance     Sitting balance-Leahy Scale: Fair         Standing balance comment: CGA to min assist with RW             ADL either performed or assessed with clinical judgement   ADL Overall ADL's : Needs assistance/impaired Eating/Feeding: Independent;Sitting   Grooming: Set up;Supervision/safety;Contact guard assist;Sitting Grooming Details (indicate cue type and reason): Set-up seated or CGA standing       Lower Body Bathing Details (indicate cue type and reason): OT recommended the pt use a shower seat for added safety and decreased falls risk during bathing tasks in the home. He stated he intends to go to his signifcant other's home to bathe, as she has a walk-in shower and shower seat. Upper Body Dressing : Set up;Sitting   Lower Body Dressing: With adaptive equipment;Sitting/lateral leans Lower Body Dressing Details (indicate cue type and reason): The pt reported he uses a sock aid at home to don his socks. OT instructed him on how to use a reacher to doff his socks, as well as to don lower body clothing articles.   Toilet Transfer Details (indicate cue type and reason): The pt reported he has toilet rails on his home toilet. OT educated him on the option for using a toilet riser, to facilitate improved ability to transfer onto and off the toilet after  hip surgery.                 Vision   Additional Comments: He correctly read the time depicted on the wall clock.            Pertinent Vitals/Pain Pain Assessment Pain Assessment: 0-10 Pain Score: 5  Pain Location:  (R hip with activity) Pain Intervention(s): Limited activity within patient's tolerance, Monitored during session     Extremity/Trunk Assessment Upper Extremity Assessment Upper Extremity Assessment: Right hand dominant;RUE deficits/detail;LUE  deficits/detail RUE Deficits / Details: AROM WFL. Functional grip strength LUE Deficits / Details: AROM WFL. Functional grip strength           Communication Communication Communication: No apparent difficulties   Cognition Arousal: Alert Behavior During Therapy: WFL for tasks assessed/performed               OT - Cognition Comments: Oriented x4                 Following commands: Intact                  Home Living Family/patient expects to be discharged to:: Private residence Living Arrangements: Alone   Type of Home: Apartment Home Access: Level entry     Home Layout: One level     Bathroom Shower/Tub: Tub/shower unit         Home Equipment: Optometrist (2 wheels) Adaptive Equipment: Reacher;Sock aid        Prior Functioning/Environment Prior Level of Function : Independent/Modified Independent;Driving             Mobility Comments:  (Occasional use of a RW when he wakes up in the morning.) ADLs Comments:  (Independent with ADLs, cooking, and driving. He has hired assistance for cleaning.)    OT Problem List: Decreased strength;Decreased range of motion;Impaired balance (sitting and/or standing);Pain   OT Treatment/Interventions: Self-care/ADL training;Therapeutic exercise;Therapeutic activities;Energy conservation;Patient/family education;DME and/or AE instruction;Balance training      OT Goals(Current goals can be found in the care plan section)   Acute Rehab OT Goals OT Goal Formulation: With patient Time For Goal Achievement: 05/14/24 Potential to Achieve Goals: Good ADL Goals Pt Will Perform Grooming: standing;with supervision Pt Will Perform Lower Body Dressing: with supervision;with adaptive equipment;sit to/from stand;sitting/lateral leans Pt Will Transfer to Toilet: with supervision;ambulating Pt Will Perform Toileting - Clothing Manipulation and hygiene: with supervision;sit to/from stand   OT  Frequency:  Min 2X/week       AM-PAC OT 6 Clicks Daily Activity     Outcome Measure Help from another person eating meals?: None Help from another person taking care of personal grooming?: A Little Help from another person toileting, which includes using toliet, bedpan, or urinal?: A Little Help from another person bathing (including washing, rinsing, drying)?: A Lot Help from another person to put on and taking off regular upper body clothing?: A Little Help from another person to put on and taking off regular lower body clothing?: A Lot 6 Click Score: 17   End of Session Equipment Utilized During Treatment: Rolling walker (2 wheels) Nurse Communication: Mobility status  Activity Tolerance: Patient limited by pain Patient left: in chair;with call bell/phone within reach;with chair alarm set  OT Visit Diagnosis: Pain;Muscle weakness (generalized) (M62.81);Other abnormalities of gait and mobility (R26.89) Pain - Right/Left: Right Pain - part of body: Hip                Time: 8743-8682 OT Time Calculation (min):  21 min Charges:  OT General Charges $OT Visit: 1 Visit OT Evaluation $OT Eval Moderate Complexity: 1 Mod    Leyan Branden L Winnifred Dufford, OTR/L 04/30/2024, 2:49 PM

## 2024-04-30 NOTE — Discharge Summary (Signed)
 Patient ID: Franklin Fisher MRN: [id number] DOB/AGE: 1941/04/15 83 y.o.  Admit date: 04/29/2024 Discharge date: 04/30/2024  Admission Diagnoses:  Principal Problem:   S/P total right hip arthroplasty Active Problems:   Osteoarthritis of right hip   Discharge Diagnoses:  Same  Past Medical History:  Diagnosis Date   Arthritis    Asthma    mild, has rescue inhaler   BPH (benign prostatic hyperplasia)    Gallstones    Headache    stopped at age 84   History of kidney stones    passes 1-2 per year   Hypertension 2000   Dr. Kip   Pneumonia    Sleep apnea    CPAP   TIA (transient ischemic attack)    pt denies    Surgeries: Procedure(s): ARTHROPLASTY, HIP, TOTAL, ANTERIOR APPROACH on 04/29/2024   Consultants:   Discharged Condition: Improved  Hospital Course: Franklin Fisher is an 83 y.o. male who was admitted 04/29/2024 for operative treatment ofS/P total right hip arthroplasty. Patient has severe unremitting pain that affects sleep, daily activities, and work/hobbies. After pre-op clearance the patient was taken to the operating room on 04/29/2024 and underwent  Procedure(s): ARTHROPLASTY, HIP, TOTAL, ANTERIOR APPROACH.    Patient was given perioperative antibiotics:  Anti-infectives (From admission, onward)    Start     Dose/Rate Route Frequency Ordered Stop   04/29/24 0730  ceFAZolin  (ANCEF ) IVPB 2g/100 mL premix        2 g 200 mL/hr over 30 Minutes Intravenous On call to O.R. 04/29/24 9275 04/29/24 9062        Patient was given sequential compression devices, early ambulation, and chemoprophylaxis to prevent DVT.  Patient benefited maximally from hospital stay and there were no complications.    Recent vital signs: Patient Vitals for the past 24 hrs:  BP Temp Temp src Pulse Resp SpO2  04/30/24 0546 128/63 98.1 F (36.7 C) Oral (!) 58 18 99 %  04/30/24 0108 114/60 98.2 F (36.8 C) -- 61 18 95 %  04/29/24 2250 128/67 98.2 F (36.8 C) -- 66 18 97 %   04/29/24 1927 135/72 98.2 F (36.8 C) Oral 85 18 97 %  04/29/24 1621 (!) 140/76 97.8 F (36.6 C) Oral 79 18 95 %  04/29/24 1328 138/65 97.6 F (36.4 C) Oral (!) 56 16 99 %  04/29/24 1312 118/61 -- -- (!) 52 -- 98 %  04/29/24 1300 -- -- -- (!) 54 -- 99 %     Recent laboratory studies: No results for input(s): WBC, HGB, HCT, PLT, NA, K, CL, CO2, BUN, CREATININE, GLUCOSE, INR, CALCIUM  in the last 72 hours.  Invalid input(s): PT, 2   Discharge Medications:   Allergies as of 04/30/2024       Reactions   Antihistamines, Diphenhydramine -type Other (See Comments)   Pt tries to avoid anticholinergics d/t age    Iodinated Contrast Media Hives   Other    Other Reaction(s): hives within seconds        Medication List     TAKE these medications    aspirin  EC 81 MG tablet Take 1 tablet (81 mg total) by mouth 2 (two) times daily.   atenolol  100 MG tablet Commonly known as: TENORMIN  Take 100 mg by mouth in the morning.   hydrocortisone  2.5 % lotion Apply 1 Application topically 2 (two) times daily as needed (rash/irritation.).   ketoconazole  2 % shampoo Commonly known as: NIZORAL  Apply 1 application  topically once a week.  Lubricant Eye Drops 0.4-0.3 % Soln Generic drug: Polyethyl Glycol-Propyl Glycol Place 1-2 drops into both eyes 2 (two) times daily as needed (dry/irritated eyes.).   nystatin  ointment Commonly known as: MYCOSTATIN  Apply 1 Application topically daily as needed (skin irritation/rash (skin folds)).   oxyCODONE -acetaminophen  7.5-325 MG tablet Commonly known as: PERCOCET Take 1 tablet by mouth every 4 (four) hours as needed (arthritis pain.). What changed: when to take this   tamsulosin  0.4 MG Caps capsule Commonly known as: FLOMAX  Take 0.8 mg by mouth in the morning.   tiZANidine  2 MG tablet Commonly known as: ZANAFLEX  Take 1 tablet (2 mg total) by mouth every 6 (six) hours as needed for muscle spasms.                Durable Medical Equipment  (From admission, onward)           Start     Ordered   04/29/24 1323  DME Walker rolling  Once       Question:  Patient needs a walker to treat with the following condition  Answer:  Status post right hip replacement   04/29/24 1322   04/29/24 1323  DME 3 n 1  Once        04/29/24 1322              Discharge Care Instructions  (From admission, onward)           Start     Ordered   04/30/24 0000  Weight bearing as tolerated        04/30/24 1254            Diagnostic Studies: DG HIP UNILAT WITH PELVIS 1V RIGHT Result Date: 04/29/2024 CLINICAL DATA:  Right hip replacement EXAM: DG HIP (WITH OR WITHOUT PELVIS) 1V RIGHT COMPARISON:  None Available. FINDINGS: Remote changes of left hip replacement. Right hip replacement changes noted with normal AP alignment. No visible hardware bony complicating feature. IMPRESSION: Right hip replacement.  No visible complicating feature. Remote left hip replacement unremarkable. Electronically Signed   By: Franky Fisher M.D.   On: 04/29/2024 14:07   DG C-Arm 1-60 Min-No Report Result Date: 04/29/2024 Fluoroscopy was utilized by the requesting physician.  No radiographic interpretation.   DG Chest 2 View Result Date: 04/17/2024 CLINICAL DATA:  Preop for hip replacement. EXAM: CHEST - 2 VIEW COMPARISON:  Chest radiograph 05/25/2020 FINDINGS: Stable heart size and mediastinal contours. The heart is upper normal in size. The lungs are clear. Pulmonary vasculature is normal. No consolidation, pleural effusion, or pneumothorax. Thoracic spondylosis. No acute osseous abnormalities are seen. IMPRESSION: No active cardiopulmonary disease. Electronically Signed   By: Franklin Fisher M.D.   On: 04/17/2024 14:13    Disposition: Discharge disposition: 01-Home or Self Care       Discharge Instructions     Call MD / Call 911   Complete by: As directed    If you experience chest pain or shortness of breath,  CALL 911 and be transported to the hospital emergency room.  If you develope a fever above 101 F, pus (white drainage) or increased drainage or redness at the wound, or calf pain, call your surgeon's office.   Constipation Prevention   Complete by: As directed    Drink plenty of fluids.  Prune juice may be helpful.  You may use a stool softener, such as Colace (over the counter) 100 mg twice a day.  Use MiraLax  (over the counter) for constipation as needed.  Diet - low sodium heart healthy   Complete by: As directed    Driving restrictions   Complete by: As directed    No driving for 2 weeks   Increase activity slowly as tolerated   Complete by: As directed    Patient may shower   Complete by: As directed    You may shower without a dressing once there is no drainage.  Do not wash over the wound.  If drainage remains, cover wound with plastic wrap and then shower.   Post-operative opioid taper instructions:   Complete by: As directed    POST-OPERATIVE OPIOID TAPER INSTRUCTIONS: It is important to wean off of your opioid medication as soon as possible. If you do not need pain medication after your surgery it is ok to stop day one. Opioids include: Codeine, Hydrocodone(Norco, Vicodin), Oxycodone (Percocet, oxycontin ) and hydromorphone  amongst others.  Long term and even short term use of opiods can cause: Increased pain response Dependence Constipation Depression Respiratory depression And more.  Withdrawal symptoms can include Flu like symptoms Nausea, vomiting And more Techniques to manage these symptoms Hydrate well Eat regular healthy meals Stay active Use relaxation techniques(deep breathing, meditating, yoga) Do Not substitute Alcohol  to help with tapering If you have been on opioids for less than two weeks and do not have pain than it is ok to stop all together.  Plan to wean off of opioids This plan should start within one week post op of your joint replacement. Maintain  the same interval or time between taking each dose and first decrease the dose.  Cut the total daily intake of opioids by one tablet each day Next start to increase the time between doses. The last dose that should be eliminated is the evening dose.      Weight bearing as tolerated   Complete by: As directed         Follow-up Information     Liam Lerner, MD. Go on 05/09/2024.   Specialty: Orthopedic Surgery Why: your apointment is scheduled for 10:00 Contact information: [address] [place] [address] [phone]         [facility] Follow up.   Why: HHPT/OT will provide 6 home visits prior to starting outpatient physical therapy        [facility] Go on 05/09/2024.   Why: Your outpatient physical therapy is scheduled for 11:00. Please arrive at 10:45. to complete your paperwork Contact information: Physical Therapy [address] [place] [address] [phone]                  Signed: Camellia Ellen 04/30/2024, 12:54 PM

## 2024-04-30 NOTE — Progress Notes (Signed)
 PATIENT ID: Franklin Fisher  MRN: [id number]  DOB/AGE:  04-10-1941 / 83 y.o.  1 Day Post-Op Procedure(s) (LRB): ARTHROPLASTY, HIP, TOTAL, ANTERIOR APPROACH (Right)    PROGRESS NOTE Subjective: Patient is alert, oriented, no Nausea, no Vomiting, yes passing gas, . Taking PO well. Denies SOB, Chest or Calf Pain. Using Incentive Spirometer, PAS in place. Ambulate WBAT Patient reports pain as  2/10  .    Objective: Vital signs in last 24 hours: Vitals:   04/29/24 1927 04/29/24 2250 04/30/24 0108 04/30/24 0546  BP: 135/72 128/67 114/60 128/63  Pulse: 85 66 61 (!) 58  Resp: 18 18 18 18   Temp: 98.2 F (36.8 C) 98.2 F (36.8 C) 98.2 F (36.8 C) 98.1 F (36.7 C)  TempSrc: Oral   Oral  SpO2: 97% 97% 95% 99%  Weight:      Height:          Intake/Output from previous day: I/O last 3 completed shifts: In: 2843.2 [P.O.:840; I.V.:1903.2; IV Piggyback:100] Out: 3250 [Urine:2950; Blood:300]   Intake/Output this shift: No intake/output data recorded.   LABORATORY DATA: No results for input(s): WBC, HGB, HCT, PLT, NA, K, CL, CO2, BUN, CREATININE, GLUCOSE, GLUCAP, INR, CALCIUM  in the last 72 hours.  Invalid input(s): PT, 2  Examination: Neurologically intact ABD soft Neurovascular intact Sensation intact distally Intact pulses distally Dorsiflexion/Plantar flexion intact Incision: dressing C/D/I No cellulitis present Compartment soft } XR AP&Lat of hip shows well placed\fixed THA  Assessment:     1 Day Post-Op Procedure(s) (LRB): ARTHROPLASTY, HIP, TOTAL, ANTERIOR APPROACH (Right) ADDITIONAL DIAGNOSIS:  Expected Acute Blood Loss Anemia, Hypertension  Patient's anticipated LOS is less than 2 midnights, meeting these requirements: - Younger than 53 - Lives within 1 hour of care - Has a competent adult at home to recover with post-op recover - NO history of  - Chronic pain requiring opiods  - Diabetes  - Coronary Artery Disease  - Heart  failure  - Heart attack  - Stroke  - DVT/VTE  - Cardiac arrhythmia  - Respiratory Failure/COPD  - Renal failure  - Anemia  - Advanced Liver disease     Plan: PT/OT WBAT, THA  DVT Prophylaxis: SCDx72 hrs, ASA 81 mg BID x 2 weeks  DISCHARGE PLAN: Home, today if passes PT  DISCHARGE NEEDS: HHPT, Walker, and 3-in-1 comode seatPatient ID: Franklin Fisher, male   DOB: December 08, 1940, 83 y.o.   MRN: [id number]

## 2024-04-30 NOTE — TOC Transition Note (Signed)
 Transition of Care [facility]) - Discharge Note   Patient Details  Name: Franklin Fisher MRN: [id number] Date of Birth: [birth year]  Transition of Care [facility] CM/SW Contact:  Heather DELENA Saltness, LCSW Phone Number: 04/30/2024, 12:57 PM   Clinical Narrative:    Pt to discharge home today to significant other's home. Pt is Ortho Bundle. Pt has HH PT/OT set up with [facility]. Pt denies any DME needs. Pt's significant other will transport pt upon discharge. No further TOC needs at this time.   Final next level of care: Home w Home Health Services Barriers to Discharge: Barriers Resolved   Patient Goals and CMS Choice Patient states their goals for this hospitalization and ongoing recovery are:: To return home         Discharge Placement  Home with [facility] services              Patient to be transferred to facility by: Family Name of family member notified: Patient Patient and family notified of of transfer: 04/30/24  Discharge Plan and Services Additional resources added to the After Visit Summary for [facility] services                DME Arranged: N/A DME Agency: NA       HH Arranged: PT, OT HH Agency: [facility] Date HH Agency Contacted: 04/30/24 Time HH Agency Contacted: [phone]    Social Drivers of Health (SDOH) Interventions SDOH Screenings   Food Insecurity: No Food Insecurity (04/29/2024)  Housing: Low Risk  (04/29/2024)  Transportation Needs: No Transportation Needs (04/29/2024)  Utilities: Not At Risk (04/29/2024)  Social Connections: Moderately Integrated (04/29/2024)  Tobacco Use: Low Risk  (04/29/2024)     Readmission Risk Interventions     No data to display

## 2024-04-30 NOTE — Progress Notes (Signed)
 Physical Therapy Treatment Patient Details Name: Franklin Fisher MRN: [id number] DOB: 04/03/1941 Today's Date: 04/30/2024   History of Present Illness Franklin Fisher is a 83 yr old male admitted to the hospital for a R THA on 04-29-24. PMH: tinitus of L ear, back pain, L THA in 2020, HTN, OSA    PT Comments  POD # 1 pm session AxO x 3 pleasant and motivated.  Had prior THR but was 5 years ago.  Lives home alone and plans to stay at [facility] house.  She has no stairs to enter.  She is 83 years old and can not physically lift but she all other activities is is able including driving. Pt OOB in recliner.  Assisted with amb a greater distance in hallway.  Completed all standing TE's following HEP handout.  Addressed all mobility questions, discussed appropriate activity, educated on use of ICE.  Pt ready for D/C to GF home.    If plan is discharge home, recommend the following: A little help with bathing/dressing/bathroom   Can travel by private vehicle        Equipment Recommendations  None recommended by PT    Recommendations for Other Services       Precautions / Restrictions Precautions Precautions: Fall Restrictions Weight Bearing Restrictions Per Provider Order: No RLE Weight Bearing Per Provider Order: Weight bearing as tolerated     Mobility  Bed Mobility   General bed mobility comments: OOB in recliner    Transfers Overall transfer level: Needs assistance Equipment used: Rolling walker (2 wheels) Transfers: Sit to/from Stand Sit to Stand: Supervision, Contact guard assist           General transfer comment: VC's on proper hand placement and to increase forward lean as he presents with posterior Franklin Fisher lean.  VC's for safety with turns and back reach reach prior to sit as Pt tends to plop.  Difficulty self scooting due to body habitus.    Ambulation/Gait Ambulation/Gait assistance: Supervision, Contact guard assist Gait Distance (Feet): 65 Feet Assistive device:  Rolling walker (2 wheels) Gait Pattern/deviations: Step-to pattern, Decreased stance time - right Gait velocity: decreased     General Gait Details: Required VC's on proper walker to self distance and safety with turns as well as back steps. Tolerated amb 65 feet with reports 5/10 hip pain.   Stairs             Wheelchair Mobility     Tilt Bed    Modified Rankin (Stroke Patients Only)       Balance                                            Communication Communication Communication: No apparent difficulties  Cognition Arousal: Alert Behavior During Therapy: WFL for tasks assessed/performed   PT - Cognitive impairments: No apparent impairments                       PT - Cognition Comments: AxO x 3 pleasant and motivated.  Had prior THR but was 5 years ago.  Lives home alone and plans to stay at [facility] house.  She has no stairs to enter.  She is 83 years old and can not physically lift but she all other activities is is able including driving. Following commands: Intact      Cueing Cueing Techniques: Verbal cues  Exercises      General Comments        Pertinent Vitals/Pain Pain Assessment Pain Assessment: 0-10 Pain Score: 5  Pain Location: R hip Pain Descriptors / Indicators: Sore, Tender, Operative site guarding Pain Intervention(s): Monitored during session, Premedicated before session, Repositioned, Ice applied    Home Living Family/patient expects to be discharged to:: Private residence Living Arrangements: Alone   Type of Home: Apartment Home Access: Level entry       Home Layout: One level Home Equipment: Optometrist (2 wheels)      Prior Function            PT Goals (current goals can now be found in the care plan section) Progress towards PT goals: Progressing toward goals    Frequency    7X/week      PT Plan      Co-evaluation              AM-PAC PT 6 Clicks Mobility    Outcome Measure  Help needed turning from your back to your side while in a flat bed without using bedrails?: A Lot Help needed moving from lying on your back to sitting on the side of a flat bed without using bedrails?: A Lot Help needed moving to and from a bed to a chair (including a wheelchair)?: A Little Help needed standing up from a chair using your arms (e.g., wheelchair or bedside chair)?: A Little Help needed to walk in hospital room?: A Little Help needed climbing 3-5 steps with a railing? : A Lot 6 Click Score: 15    End of Session Equipment Utilized During Treatment: Gait belt Activity Tolerance: Patient tolerated treatment well Patient Franklin Fisher: with call bell/phone within reach;in chair;with chair alarm set Nurse Communication: Mobility status PT Visit Diagnosis: Other abnormalities of gait and mobility (R26.89)     Time: 8595-8567 PT Time Calculation (min) (ACUTE ONLY): 28 min  Charges:    $Gait Training: 8-22 mins $Therapeutic Exercise: 8-22 mins PT General Charges $$ ACUTE PT VISIT: 1 Visit                    Katheryn Leap  PTA Acute  Rehabilitation Services Office M-F          [phone]

## 2024-05-01 DIAGNOSIS — Z7982 Long term (current) use of aspirin: Secondary | ICD-10-CM | POA: Diagnosis not present

## 2024-05-01 DIAGNOSIS — I10 Essential (primary) hypertension: Secondary | ICD-10-CM | POA: Diagnosis not present

## 2024-05-01 DIAGNOSIS — J45909 Unspecified asthma, uncomplicated: Secondary | ICD-10-CM | POA: Diagnosis not present

## 2024-05-01 DIAGNOSIS — Z96641 Presence of right artificial hip joint: Secondary | ICD-10-CM | POA: Diagnosis not present

## 2024-05-01 DIAGNOSIS — N400 Enlarged prostate without lower urinary tract symptoms: Secondary | ICD-10-CM | POA: Diagnosis not present

## 2024-05-01 DIAGNOSIS — Z471 Aftercare following joint replacement surgery: Secondary | ICD-10-CM | POA: Diagnosis not present

## 2024-05-04 DIAGNOSIS — Z7982 Long term (current) use of aspirin: Secondary | ICD-10-CM | POA: Diagnosis not present

## 2024-05-04 DIAGNOSIS — Z96641 Presence of right artificial hip joint: Secondary | ICD-10-CM | POA: Diagnosis not present

## 2024-05-04 DIAGNOSIS — Z471 Aftercare following joint replacement surgery: Secondary | ICD-10-CM | POA: Diagnosis not present

## 2024-05-04 DIAGNOSIS — I10 Essential (primary) hypertension: Secondary | ICD-10-CM | POA: Diagnosis not present

## 2024-05-04 DIAGNOSIS — N400 Enlarged prostate without lower urinary tract symptoms: Secondary | ICD-10-CM | POA: Diagnosis not present

## 2024-05-04 DIAGNOSIS — J45909 Unspecified asthma, uncomplicated: Secondary | ICD-10-CM | POA: Diagnosis not present

## 2024-05-06 DIAGNOSIS — I10 Essential (primary) hypertension: Secondary | ICD-10-CM | POA: Diagnosis not present

## 2024-05-06 DIAGNOSIS — J45909 Unspecified asthma, uncomplicated: Secondary | ICD-10-CM | POA: Diagnosis not present

## 2024-05-06 DIAGNOSIS — Z471 Aftercare following joint replacement surgery: Secondary | ICD-10-CM | POA: Diagnosis not present

## 2024-05-06 DIAGNOSIS — Z7982 Long term (current) use of aspirin: Secondary | ICD-10-CM | POA: Diagnosis not present

## 2024-05-06 DIAGNOSIS — N400 Enlarged prostate without lower urinary tract symptoms: Secondary | ICD-10-CM | POA: Diagnosis not present

## 2024-05-06 DIAGNOSIS — Z96641 Presence of right artificial hip joint: Secondary | ICD-10-CM | POA: Diagnosis not present

## 2024-05-08 DIAGNOSIS — I10 Essential (primary) hypertension: Secondary | ICD-10-CM | POA: Diagnosis not present

## 2024-05-08 DIAGNOSIS — N400 Enlarged prostate without lower urinary tract symptoms: Secondary | ICD-10-CM | POA: Diagnosis not present

## 2024-05-08 DIAGNOSIS — J45909 Unspecified asthma, uncomplicated: Secondary | ICD-10-CM | POA: Diagnosis not present

## 2024-05-08 DIAGNOSIS — Z96641 Presence of right artificial hip joint: Secondary | ICD-10-CM | POA: Diagnosis not present

## 2024-05-08 DIAGNOSIS — Z471 Aftercare following joint replacement surgery: Secondary | ICD-10-CM | POA: Diagnosis not present

## 2024-05-08 DIAGNOSIS — Z7982 Long term (current) use of aspirin: Secondary | ICD-10-CM | POA: Diagnosis not present

## 2024-05-09 DIAGNOSIS — Z96641 Presence of right artificial hip joint: Secondary | ICD-10-CM | POA: Diagnosis not present

## 2024-05-09 DIAGNOSIS — M1611 Unilateral primary osteoarthritis, right hip: Secondary | ICD-10-CM | POA: Diagnosis not present

## 2024-05-10 DIAGNOSIS — N400 Enlarged prostate without lower urinary tract symptoms: Secondary | ICD-10-CM | POA: Diagnosis not present

## 2024-05-10 DIAGNOSIS — Z471 Aftercare following joint replacement surgery: Secondary | ICD-10-CM | POA: Diagnosis not present

## 2024-05-10 DIAGNOSIS — I10 Essential (primary) hypertension: Secondary | ICD-10-CM | POA: Diagnosis not present

## 2024-05-10 DIAGNOSIS — J45909 Unspecified asthma, uncomplicated: Secondary | ICD-10-CM | POA: Diagnosis not present

## 2024-05-10 DIAGNOSIS — Z7982 Long term (current) use of aspirin: Secondary | ICD-10-CM | POA: Diagnosis not present

## 2024-05-10 DIAGNOSIS — Z96641 Presence of right artificial hip joint: Secondary | ICD-10-CM | POA: Diagnosis not present

## 2024-05-13 DIAGNOSIS — J45909 Unspecified asthma, uncomplicated: Secondary | ICD-10-CM | POA: Diagnosis not present

## 2024-05-13 DIAGNOSIS — Z96641 Presence of right artificial hip joint: Secondary | ICD-10-CM | POA: Diagnosis not present

## 2024-05-13 DIAGNOSIS — I10 Essential (primary) hypertension: Secondary | ICD-10-CM | POA: Diagnosis not present

## 2024-05-13 DIAGNOSIS — N400 Enlarged prostate without lower urinary tract symptoms: Secondary | ICD-10-CM | POA: Diagnosis not present

## 2024-05-13 DIAGNOSIS — Z7982 Long term (current) use of aspirin: Secondary | ICD-10-CM | POA: Diagnosis not present

## 2024-05-13 DIAGNOSIS — Z471 Aftercare following joint replacement surgery: Secondary | ICD-10-CM | POA: Diagnosis not present

## 2024-05-15 ENCOUNTER — Ambulatory Visit: Admitting: Orthopaedic Surgery

## 2024-05-16 DIAGNOSIS — X58XXXA Exposure to other specified factors, initial encounter: Secondary | ICD-10-CM | POA: Diagnosis not present

## 2024-05-16 DIAGNOSIS — Z96641 Presence of right artificial hip joint: Secondary | ICD-10-CM | POA: Diagnosis not present

## 2024-05-16 DIAGNOSIS — S90415A Abrasion, left lesser toe(s), initial encounter: Secondary | ICD-10-CM | POA: Diagnosis not present

## 2024-05-16 DIAGNOSIS — J45909 Unspecified asthma, uncomplicated: Secondary | ICD-10-CM | POA: Diagnosis not present

## 2024-05-16 DIAGNOSIS — M25551 Pain in right hip: Secondary | ICD-10-CM | POA: Diagnosis not present

## 2024-05-16 DIAGNOSIS — M1611 Unilateral primary osteoarthritis, right hip: Secondary | ICD-10-CM | POA: Diagnosis not present

## 2024-05-16 DIAGNOSIS — N400 Enlarged prostate without lower urinary tract symptoms: Secondary | ICD-10-CM | POA: Diagnosis not present

## 2024-05-16 DIAGNOSIS — Z7982 Long term (current) use of aspirin: Secondary | ICD-10-CM | POA: Diagnosis not present

## 2024-05-16 DIAGNOSIS — M4726 Other spondylosis with radiculopathy, lumbar region: Secondary | ICD-10-CM | POA: Diagnosis not present

## 2024-05-16 DIAGNOSIS — G894 Chronic pain syndrome: Secondary | ICD-10-CM | POA: Diagnosis not present

## 2024-05-16 DIAGNOSIS — Z79891 Long term (current) use of opiate analgesic: Secondary | ICD-10-CM | POA: Diagnosis not present

## 2024-05-16 DIAGNOSIS — L089 Local infection of the skin and subcutaneous tissue, unspecified: Secondary | ICD-10-CM | POA: Diagnosis not present

## 2024-05-16 DIAGNOSIS — Z471 Aftercare following joint replacement surgery: Secondary | ICD-10-CM | POA: Diagnosis not present

## 2024-05-16 DIAGNOSIS — I10 Essential (primary) hypertension: Secondary | ICD-10-CM | POA: Diagnosis not present

## 2024-05-16 DIAGNOSIS — M545 Low back pain, unspecified: Secondary | ICD-10-CM | POA: Diagnosis not present

## 2024-05-20 DIAGNOSIS — N400 Enlarged prostate without lower urinary tract symptoms: Secondary | ICD-10-CM | POA: Diagnosis not present

## 2024-05-20 DIAGNOSIS — J45909 Unspecified asthma, uncomplicated: Secondary | ICD-10-CM | POA: Diagnosis not present

## 2024-05-20 DIAGNOSIS — I10 Essential (primary) hypertension: Secondary | ICD-10-CM | POA: Diagnosis not present

## 2024-05-20 DIAGNOSIS — Z471 Aftercare following joint replacement surgery: Secondary | ICD-10-CM | POA: Diagnosis not present

## 2024-05-20 DIAGNOSIS — Z7982 Long term (current) use of aspirin: Secondary | ICD-10-CM | POA: Diagnosis not present

## 2024-05-20 DIAGNOSIS — Z96641 Presence of right artificial hip joint: Secondary | ICD-10-CM | POA: Diagnosis not present

## 2024-05-22 DIAGNOSIS — Z96641 Presence of right artificial hip joint: Secondary | ICD-10-CM | POA: Diagnosis not present

## 2024-05-22 DIAGNOSIS — I10 Essential (primary) hypertension: Secondary | ICD-10-CM | POA: Diagnosis not present

## 2024-05-22 DIAGNOSIS — N400 Enlarged prostate without lower urinary tract symptoms: Secondary | ICD-10-CM | POA: Diagnosis not present

## 2024-05-22 DIAGNOSIS — Z7982 Long term (current) use of aspirin: Secondary | ICD-10-CM | POA: Diagnosis not present

## 2024-05-22 DIAGNOSIS — J45909 Unspecified asthma, uncomplicated: Secondary | ICD-10-CM | POA: Diagnosis not present

## 2024-05-22 DIAGNOSIS — Z471 Aftercare following joint replacement surgery: Secondary | ICD-10-CM | POA: Diagnosis not present

## 2024-05-24 DIAGNOSIS — R600 Localized edema: Secondary | ICD-10-CM | POA: Diagnosis not present

## 2024-05-24 DIAGNOSIS — R197 Diarrhea, unspecified: Secondary | ICD-10-CM | POA: Diagnosis not present

## 2024-05-24 DIAGNOSIS — Z96641 Presence of right artificial hip joint: Secondary | ICD-10-CM | POA: Diagnosis not present

## 2024-05-24 DIAGNOSIS — L309 Dermatitis, unspecified: Secondary | ICD-10-CM | POA: Diagnosis not present

## 2024-05-27 DIAGNOSIS — I10 Essential (primary) hypertension: Secondary | ICD-10-CM | POA: Diagnosis not present

## 2024-05-27 DIAGNOSIS — Z96641 Presence of right artificial hip joint: Secondary | ICD-10-CM | POA: Diagnosis not present

## 2024-05-27 DIAGNOSIS — Z471 Aftercare following joint replacement surgery: Secondary | ICD-10-CM | POA: Diagnosis not present

## 2024-05-27 DIAGNOSIS — J45909 Unspecified asthma, uncomplicated: Secondary | ICD-10-CM | POA: Diagnosis not present

## 2024-05-27 DIAGNOSIS — N400 Enlarged prostate without lower urinary tract symptoms: Secondary | ICD-10-CM | POA: Diagnosis not present

## 2024-05-27 DIAGNOSIS — Z7982 Long term (current) use of aspirin: Secondary | ICD-10-CM | POA: Diagnosis not present

## 2024-05-28 ENCOUNTER — Telehealth (HOSPITAL_COMMUNITY): Payer: Self-pay

## 2024-05-28 NOTE — Telephone Encounter (Signed)
 Attempted to contact the patient to schedule VAS US .  Yes answer.  Answered, Scheduled.  First Attempt. Provided  direct contact number for scheduling: [phone].  Spoke with patient and scheduled provided soonest availability, 05/28/24, patient requested delay due to transportation concerns. Should be corrected by time of scheduled appointment.   Referring office has been informed.

## 2024-05-29 DIAGNOSIS — R609 Edema, unspecified: Secondary | ICD-10-CM | POA: Diagnosis not present

## 2024-05-29 DIAGNOSIS — S90416A Abrasion, unspecified lesser toe(s), initial encounter: Secondary | ICD-10-CM | POA: Diagnosis not present

## 2024-05-29 DIAGNOSIS — Z7409 Other reduced mobility: Secondary | ICD-10-CM | POA: Diagnosis not present

## 2024-05-30 ENCOUNTER — Ambulatory Visit (HOSPITAL_COMMUNITY)
Admission: RE | Admit: 2024-05-30 | Discharge: 2024-05-30 | Disposition: A | Discharge status: Home / Self Care | Source: Ambulatory Visit | Attending: Family Medicine | Admitting: Family Medicine

## 2024-05-30 ENCOUNTER — Other Ambulatory Visit (HOSPITAL_COMMUNITY): Payer: Self-pay | Admitting: Family Medicine

## 2024-05-30 DIAGNOSIS — Z7982 Long term (current) use of aspirin: Secondary | ICD-10-CM | POA: Diagnosis not present

## 2024-05-30 DIAGNOSIS — R609 Edema, unspecified: Secondary | ICD-10-CM | POA: Insufficient documentation

## 2024-05-30 DIAGNOSIS — N400 Enlarged prostate without lower urinary tract symptoms: Secondary | ICD-10-CM | POA: Diagnosis not present

## 2024-05-30 DIAGNOSIS — Z471 Aftercare following joint replacement surgery: Secondary | ICD-10-CM | POA: Diagnosis not present

## 2024-05-30 DIAGNOSIS — J45909 Unspecified asthma, uncomplicated: Secondary | ICD-10-CM | POA: Diagnosis not present

## 2024-05-30 DIAGNOSIS — Z96641 Presence of right artificial hip joint: Secondary | ICD-10-CM | POA: Diagnosis not present

## 2024-05-30 DIAGNOSIS — I10 Essential (primary) hypertension: Secondary | ICD-10-CM | POA: Diagnosis not present

## 2024-05-31 DIAGNOSIS — Z7982 Long term (current) use of aspirin: Secondary | ICD-10-CM | POA: Diagnosis not present

## 2024-05-31 DIAGNOSIS — N400 Enlarged prostate without lower urinary tract symptoms: Secondary | ICD-10-CM | POA: Diagnosis not present

## 2024-05-31 DIAGNOSIS — I10 Essential (primary) hypertension: Secondary | ICD-10-CM | POA: Diagnosis not present

## 2024-05-31 DIAGNOSIS — J45909 Unspecified asthma, uncomplicated: Secondary | ICD-10-CM | POA: Diagnosis not present

## 2024-05-31 DIAGNOSIS — R609 Edema, unspecified: Secondary | ICD-10-CM | POA: Diagnosis not present

## 2024-05-31 DIAGNOSIS — Z96641 Presence of right artificial hip joint: Secondary | ICD-10-CM | POA: Diagnosis not present

## 2024-05-31 DIAGNOSIS — Z471 Aftercare following joint replacement surgery: Secondary | ICD-10-CM | POA: Diagnosis not present

## 2024-06-01 ENCOUNTER — Emergency Department (HOSPITAL_COMMUNITY)
Admission: EM | Admit: 2024-06-01 | Discharge: 2024-06-01 | Disposition: A | Discharge status: Home / Self Care | Attending: Emergency Medicine | Admitting: Emergency Medicine

## 2024-06-01 ENCOUNTER — Other Ambulatory Visit: Payer: Self-pay

## 2024-06-01 ENCOUNTER — Emergency Department (HOSPITAL_COMMUNITY)

## 2024-06-01 ENCOUNTER — Emergency Department (EMERGENCY_DEPARTMENT_HOSPITAL)

## 2024-06-01 ENCOUNTER — Encounter (HOSPITAL_COMMUNITY): Payer: Self-pay | Admitting: Pharmacy Technician

## 2024-06-01 DIAGNOSIS — J45909 Unspecified asthma, uncomplicated: Secondary | ICD-10-CM | POA: Diagnosis not present

## 2024-06-01 DIAGNOSIS — R079 Chest pain, unspecified: Secondary | ICD-10-CM | POA: Diagnosis not present

## 2024-06-01 DIAGNOSIS — I10 Essential (primary) hypertension: Secondary | ICD-10-CM | POA: Insufficient documentation

## 2024-06-01 DIAGNOSIS — R1031 Right lower quadrant pain: Secondary | ICD-10-CM | POA: Insufficient documentation

## 2024-06-01 DIAGNOSIS — R600 Localized edema: Secondary | ICD-10-CM | POA: Insufficient documentation

## 2024-06-01 DIAGNOSIS — Z7982 Long term (current) use of aspirin: Secondary | ICD-10-CM | POA: Insufficient documentation

## 2024-06-01 DIAGNOSIS — G8929 Other chronic pain: Secondary | ICD-10-CM | POA: Insufficient documentation

## 2024-06-01 DIAGNOSIS — M25551 Pain in right hip: Secondary | ICD-10-CM | POA: Insufficient documentation

## 2024-06-01 DIAGNOSIS — R109 Unspecified abdominal pain: Secondary | ICD-10-CM | POA: Diagnosis not present

## 2024-06-01 DIAGNOSIS — I7121 Aneurysm of the ascending aorta, without rupture: Secondary | ICD-10-CM | POA: Diagnosis not present

## 2024-06-01 DIAGNOSIS — N200 Calculus of kidney: Secondary | ICD-10-CM | POA: Diagnosis not present

## 2024-06-01 DIAGNOSIS — M79604 Pain in right leg: Secondary | ICD-10-CM | POA: Diagnosis not present

## 2024-06-01 DIAGNOSIS — M7989 Other specified soft tissue disorders: Secondary | ICD-10-CM | POA: Diagnosis not present

## 2024-06-01 DIAGNOSIS — R0601 Orthopnea: Secondary | ICD-10-CM | POA: Diagnosis not present

## 2024-06-01 LAB — BASIC METABOLIC PANEL WITH GFR
Anion gap: 12 (ref 5–15)
BUN: 16 mg/dL (ref 8–23)
CO2: 23 mmol/L (ref 22–32)
Calcium: 8.9 mg/dL (ref 8.9–10.3)
Chloride: 104 mmol/L (ref 98–111)
Creatinine, Ser: 0.96 mg/dL (ref 0.61–1.24)
GFR, Estimated: >60 mL/min (ref >60)
Glucose, Bld: 145 mg/dL — ABNORMAL HIGH (ref 70–99)
Potassium: 3.9 mmol/L (ref 3.5–5.1)
Sodium: 139 mmol/L (ref 135–145)

## 2024-06-01 LAB — CBC
HCT: 44.8 % (ref 39.0–52.0)
Hemoglobin: 14.6 g/dL (ref 13.0–17.0)
MCH: 29.9 pg (ref 26.0–34.0)
MCHC: 32.6 g/dL (ref 30.0–36.0)
MCV: 91.8 fL (ref 80.0–100.0)
Platelets: 210 K/uL (ref 150–400)
RBC: 4.88 MIL/uL (ref 4.22–5.81)
RDW: 12.8 % (ref 11.5–15.5)
WBC: 8.4 K/uL (ref 4.0–10.5)
nRBC: 0 % (ref 0.0–0.2)

## 2024-06-01 LAB — URINALYSIS, ROUTINE W REFLEX MICROSCOPIC
Bacteria, UA: NONE SEEN
Bilirubin Urine: NEGATIVE
Glucose, UA: NEGATIVE mg/dL
Hgb urine dipstick: NEGATIVE
Ketones, ur: NEGATIVE mg/dL
Nitrite: NEGATIVE
Protein, ur: NEGATIVE mg/dL
Specific Gravity, Urine: 1.009 (ref 1.005–1.030)
pH: 5 (ref 5.0–8.0)

## 2024-06-01 LAB — LIPASE, BLOOD: Lipase: 24 U/L (ref 11–51)

## 2024-06-01 LAB — BRAIN NATRIURETIC PEPTIDE: B Natriuretic Peptide: 69.2 pg/mL (ref 0.0–100.0)

## 2024-06-01 MED ORDER — IOHEXOL 350 MG/ML SOLN
100.0000 mL | Freq: Once | INTRAVENOUS | Status: AC | PRN
Start: 2024-06-01 — End: 2024-06-01
  Administered 2024-06-01: 100 mL via INTRAVENOUS

## 2024-06-01 MED ORDER — METHYLPREDNISOLONE SODIUM SUCC 40 MG IJ SOLR
40.0000 mg | Freq: Once | INTRAMUSCULAR | Status: AC
  Administered 2024-06-01: 40 mg via INTRAVENOUS
  Filled 2024-06-01: qty 1

## 2024-06-01 MED ORDER — OXYCODONE HCL 5 MG PO TABS
5.0000 mg | ORAL_TABLET | Freq: Once | ORAL | Status: AC
  Administered 2024-06-01: 5 mg via ORAL
  Filled 2024-06-01: qty 1

## 2024-06-01 MED ORDER — DIPHENHYDRAMINE HCL 50 MG/ML IJ SOLN
50.0000 mg | Freq: Once | INTRAMUSCULAR | Status: DC
  Filled 2024-06-01: qty 1

## 2024-06-01 MED ORDER — DIPHENHYDRAMINE HCL 50 MG/ML IJ SOLN
25.0000 mg | Freq: Once | INTRAMUSCULAR | Status: AC
  Administered 2024-06-01: 25 mg via INTRAVENOUS
  Filled 2024-06-01: qty 1

## 2024-06-01 MED ORDER — DIPHENHYDRAMINE HCL 25 MG PO CAPS: 50.0000 mg | ORAL_CAPSULE | Freq: Once | ORAL | Status: DC

## 2024-06-01 NOTE — ED Triage Notes (Signed)
 Pt here with reports of bilateral lower extremity edema since hip replacement a month ago. Endorses pain to R leg. Denies chest pain or shob. Taking lasix without any decrease in swelling.

## 2024-06-01 NOTE — ED Notes (Signed)
 Save blue tube in main lab

## 2024-06-01 NOTE — Discharge Instructions (Signed)
 Thank you for coming to [facility] Emergency Department. You were seen for leg swelling, elevated D-dimer, and pain in the inguinal region. We did an exam, labs, and imaging, and these showed no acute findings.  It is possible that you are having muscular pain from getting to use the cane more frequently.  You can take Tylenol  1000 mg every 8 hours for pain.  Ice or heat can also help that area.  Please follow-up with your orthopedic surgeon on 06/06/2024 as originally scheduled.  For your leg swelling, please continue to take your Lasix as prescribed and follow-up with your primary care provider within 1 week.   Do not hesitate to return to the ED or call 911 if you experience: -Worsening symptoms -Chest pain, shortness of breath -Lightheadedness, passing out -Fevers/chills -Anything else that concerns you

## 2024-06-01 NOTE — Progress Notes (Signed)
 BLE venous duplex has been completed.  Preliminary results given to Dr. Franklyn.   Results can be found under chart review under CV PROC. 06/01/2024 6:25 PM [place] Prout RVT, RDMS

## 2024-06-01 NOTE — ED Notes (Signed)
 Pt complaining of abdominal pain now.

## 2024-06-01 NOTE — ED Provider Notes (Signed)
 [place] EMERGENCY DEPARTMENT AT [facility] Provider Note   CSN: [id number] Arrival date & time: 06/01/24  1037     History  Chief Complaint  Patient presents with   Leg Swelling   Leg Pain    Franklin Fisher is an extremely pleasant 83 y.o. male with PMH as listed below who presents with Pt here with reports of bilateral lower extremity edema since R hip replacement 30 days ago with Dr. Liam. Patient states that he has been having difficulty ambulating and bearing weight on the right hip for some reason after surgery. Since the surgery he has had swelling in BL LEs, which he never had had before surgery. It doesn't go away with lasix. He also notes that for the last week he had hd pain in the right calf with palpation and over the last few days developed pain in his right groin region that is so sore he feels like he has difficulty urinating. No dysuria/hematuria or flank pain. Also endorses constipation x5 days, no nausea/vomiting, and he is still passing gas. He also endorses shortness of breath with lyin flat which is new, but no SOB at rest, cough, hemoptysis, or chest pain. States he already had a d-dimer which was elevated >3 and also had a BL DVT US  study which was negative from hips to knees but inconclusive below the knees bilaterally.    Past Medical History:  Diagnosis Date   Arthritis    Asthma    mild, has rescue inhaler   BPH (benign prostatic hyperplasia)    Gallstones    Headache    stopped at age 49   History of kidney stones    passes 1-2 per year   Hypertension 2000   Dr. Kip   Pneumonia    Sleep apnea    CPAP   TIA (transient ischemic attack)    pt denies       Home Medications Prior to Admission medications   Medication Sig Start Date End Date Taking? Authorizing Provider  aspirin  EC 81 MG tablet Take 1 tablet (81 mg total) by mouth 2 (two) times daily. 04/29/24   Orlando Camellia POUR, PA-C  atenolol  (TENORMIN ) 100 MG tablet Take 100 mg  by mouth in the morning. 07/31/22   [provider]  hydrocortisone  2.5 % lotion Apply 1 Application topically 2 (two) times daily as needed (rash/irritation.). 05/01/23   [provider]  ketoconazole  (NIZORAL ) 2 % shampoo Apply 1 application  topically once a week. 03/29/18   [provider]  nystatin  ointment (MYCOSTATIN ) Apply 1 Application topically daily as needed (skin irritation/rash (skin folds)).    [provider]  oxyCODONE -acetaminophen  (PERCOCET) 7.5-325 MG tablet Take 1 tablet by mouth every 4 (four) hours as needed (arthritis pain.). 04/29/24   Orlando Camellia POUR, PA-C  Polyethyl Glycol-Propyl Glycol (LUBRICANT EYE DROPS) 0.4-0.3 % SOLN Place 1-2 drops into both eyes 2 (two) times daily as needed (dry/irritated eyes.).    [provider]  tamsulosin  (FLOMAX ) 0.4 MG CAPS capsule Take 0.8 mg by mouth in the morning. 05/01/18   [provider]  tiZANidine  (ZANAFLEX ) 2 MG tablet Take 1 tablet (2 mg total) by mouth every 6 (six) hours as needed for muscle spasms. 04/29/24   Orlando Camellia POUR, PA-C      Allergies    Antihistamines, diphenhydramine -type; Iodinated contrast media; and Other    Review of Systems   Review of Systems A 10 point review of systems was performed  and is negative unless otherwise reported in HPI.  Physical Exam Updated Vital Signs BP (!) 155/71 (BP Location: Right Arm)   Pulse 87   Temp 98.4 F (36.9 C) (Oral)   Resp 16   SpO2 98%  Physical Exam General: Normal appearing elderly obese male, lying in bed.  HEENT: PERRLA, Sclera anicteric, MMM, trachea midline.  Cardiology: RRR, no murmurs/rubs/gallops. BL radial and DP pulses equal bilaterally.  Resp: Normal respiratory rate and effort. CTAB, no wheezes, rhonchi, crackles.  Abd: Soft, mild TTP in R pelvic region, suprapubic region. Mildly distended. No rebound tenderness or guarding.  GU: Unremarkable appearing external genitalia. TTP in R inguinal region  with no masses or bulging palpated. No overlying skin changes.  MSK: 3+ pitting bilateral lower extremity symmetric peripheral edema past knees. Some erythema noted, R>L. R calf TTP. TTP in medial right thigh as well. No induration, fluctuance, or wounds noted. No signs of trauma. Skin: warm, dry.  Neuro: A&Ox4, CNs II-XII grossly intact. MAEs. Sensation grossly intact.  Psych: Normal mood and affect.   ED Results / Procedures / Treatments   Labs (all labs ordered are listed, but only abnormal results are displayed) Labs Reviewed  BASIC METABOLIC PANEL WITH GFR - Abnormal; Notable for the following components:      Result Value   Glucose, Bld 145 (*)    All other components within normal limits  URINALYSIS, ROUTINE W REFLEX MICROSCOPIC - Abnormal; Notable for the following components:   Color, Urine STRAW (*)    Leukocytes,Ua SMALL (*)    All other components within normal limits  CBC  LIPASE, BLOOD  BRAIN NATRIURETIC PEPTIDE    EKG None  Radiology CT Angio Chest PE W and/or Wo Contrast Addendum Date: 06/01/2024 ADDENDUM REPORT: 06/01/2024 20:50 ADDENDUM: Addendum to include recommendations for ascending aortic aneurysm. 43 mm ascending aortic aneurysm. Recommend annual imaging followup by CTA or MRA. This recommendation follows 2010 ACCF/AHA/AATS/ACR/ASA/SCA/SCAI/SIR/STS/SVM Guidelines for the Diagnosis and Management of Patients with Thoracic Aortic Disease. Circulation. 2010; 121: Z733-z630. Aortic aneurysm NOS (ICD10-I71.9) Electronically Signed   By: Norman Gatlin M.D.   On: 06/01/2024 20:50   Result Date: 06/01/2024 CLINICAL DATA:  Orthopnea and left lower extremity edema. Abdominal pain. EXAM: CT ANGIOGRAPHY CHEST CT ABDOMEN AND PELVIS WITH CONTRAST TECHNIQUE: Multidetector CT imaging of the chest was performed using the standard protocol during bolus administration of intravenous contrast. Multiplanar CT image reconstructions and MIPs were obtained to evaluate the vascular  anatomy. Multidetector CT imaging of the abdomen and pelvis was performed using the standard protocol during bolus administration of intravenous contrast. RADIATION DOSE REDUCTION: This exam was performed according to the departmental dose-optimization program which includes automated exposure control, adjustment of the mA and/or kV according to patient size and/or use of iterative reconstruction technique. CONTRAST:  OMNIPAQUE  IOHEXOL  350 MG/ML SOLN COMPARISON:  Same day chest radiograph; CT abdomen pelvis 04/18/2022 FINDINGS: CTA CHEST FINDINGS Cardiovascular: Negative for pulmonary embolism. 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 Coronary artery and aortic atherosclerotic calcification. No pericardial effusion. Mediastinum/Nodes: Trachea and esophagus are unremarkable. No pathologic adenopathy. Lungs/Pleura: Atelectasis in the lingula. The lungs are otherwise clear. No pleural effusion or pneumothorax. Musculoskeletal: No acute fracture. Review of the MIP images confirms the above findings. CT ABDOMEN and PELVIS FINDINGS Hepatobiliary: Hepatic steatosis. Cholecystectomy. No biliary dilation. Pancreas: Fatty atrophy.  No acute abnormality. Spleen: Unremarkable. Adrenals/Urinary Tract: Normal adrenal glands. Nonobstructing nephrolithiasis bilaterally. Layering stones or mural calcifications in the posterior bladder similar to  prior. No hydronephrosis. Stomach/Bowel: No bowel obstruction. Colonic diverticulosis without diverticulitis. Stomach and appendix are within normal limits. Vascular/Lymphatic: Aortic atherosclerotic calcification. No pathologic adenopathy. Reproductive: Unremarkable. Other: No free intraperitoneal fluid or air. Musculoskeletal: No acute fracture.  Bilateral hip arthroplasties. Review of the MIP images confirms the above findings. IMPRESSION: 1. No acute abnormality in the chest, abdomen, or pelvis. 2. Hepatic steatosis. 3. Nonobstructing nephrolithiasis bilaterally. 4.  Aortic Atherosclerosis (ICD10-I70.0). Electronically Signed: By: Norman Gatlin M.D. On: 06/01/2024 19:24   CT ABDOMEN PELVIS W CONTRAST Addendum Date: 06/01/2024 ADDENDUM REPORT: 06/01/2024 20:50 ADDENDUM: Addendum to include recommendations for ascending aortic aneurysm. 43 mm ascending aortic aneurysm. Recommend annual imaging followup by CTA or MRA. This recommendation follows 2010 ACCF/AHA/AATS/ACR/ASA/SCA/SCAI/SIR/STS/SVM Guidelines for the Diagnosis and Management of Patients with Thoracic Aortic Disease. Circulation. 2010; 121: Z733-z630. Aortic aneurysm NOS (ICD10-I71.9) Electronically Signed   By: Norman Gatlin M.D.   On: 06/01/2024 20:50   Result Date: 06/01/2024 CLINICAL DATA:  Orthopnea and left lower extremity edema. Abdominal pain. EXAM: CT ANGIOGRAPHY CHEST CT ABDOMEN AND PELVIS WITH CONTRAST TECHNIQUE: Multidetector CT imaging of the chest was performed using the standard protocol during bolus administration of intravenous contrast. Multiplanar CT image reconstructions and MIPs were obtained to evaluate the vascular anatomy. Multidetector CT imaging of the abdomen and pelvis was performed using the standard protocol during bolus administration of intravenous contrast. RADIATION DOSE REDUCTION: This exam was performed according to the departmental dose-optimization program which includes automated exposure control, adjustment of the mA and/or kV according to patient size and/or use of iterative reconstruction technique. CONTRAST:  OMNIPAQUE  IOHEXOL  350 MG/ML SOLN COMPARISON:  Same day chest radiograph; CT abdomen pelvis 04/18/2022 FINDINGS: CTA CHEST FINDINGS Cardiovascular: Negative for pulmonary embolism. 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 Coronary artery and aortic atherosclerotic calcification. No pericardial effusion. Mediastinum/Nodes: Trachea and esophagus are unremarkable. No pathologic adenopathy. Lungs/Pleura: Atelectasis in the lingula. The lungs are  otherwise clear. No pleural effusion or pneumothorax. Musculoskeletal: No acute fracture. Review of the MIP images confirms the above findings. CT ABDOMEN and PELVIS FINDINGS Hepatobiliary: Hepatic steatosis. Cholecystectomy. No biliary dilation. Pancreas: Fatty atrophy.  No acute abnormality. Spleen: Unremarkable. Adrenals/Urinary Tract: Normal adrenal glands. Nonobstructing nephrolithiasis bilaterally. Layering stones or mural calcifications in the posterior bladder similar to prior. No hydronephrosis. Stomach/Bowel: No bowel obstruction. Colonic diverticulosis without diverticulitis. Stomach and appendix are within normal limits. Vascular/Lymphatic: Aortic atherosclerotic calcification. No pathologic adenopathy. Reproductive: Unremarkable. Other: No free intraperitoneal fluid or air. Musculoskeletal: No acute fracture.  Bilateral hip arthroplasties. Review of the MIP images confirms the above findings. IMPRESSION: 1. No acute abnormality in the chest, abdomen, or pelvis. 2. Hepatic steatosis. 3. Nonobstructing nephrolithiasis bilaterally. 4. Aortic Atherosclerosis (ICD10-I70.0). Electronically Signed: By: Norman Gatlin M.D. On: 06/01/2024 19:24   VAS US  LOWER EXTREMITY VENOUS (DVT) (7a-7p) Result Date: 06/01/2024  Lower Venous DVT Study Patient Name:  Franklin Fisher  Date of Exam:   06/01/2024 Medical Rec #: [id number]         Accession #:    [id number] Date of Birth: [birth year]         Patient Gender: M Patient Age:   35 years Exam Location:  [facility] Procedure:      VAS US  LOWER EXTREMITY VENOUS (DVT) Referring Phys: SID Ia Leeb --------------------------------------------------------------------------------  Performing Technologist: Ezzie Potters RVT, RDMS  Examination Guidelines: A complete evaluation includes B-mode imaging, spectral Doppler, color Doppler, and power Doppler as needed of all accessible portions of each vessel.  Bilateral testing is considered an integral part of a complete  examination. Limited examinations for reoccurring indications may be performed as noted. The reflux portion of the exam is performed with the patient in reverse Trendelenburg.  +--------+---------------+---------+-----------+----------+--------------------+ RIGHT   CompressibilityPhasicitySpontaneityPropertiesThrombus Aging       +--------+---------------+---------+-----------+----------+--------------------+ CFV                    Yes      Yes                  patent by                                                                 color/doppler        +--------+---------------+---------+-----------+----------+--------------------+ SFJ                                                  patent by color      +--------+---------------+---------+-----------+----------+--------------------+ FV Prox Full           Yes      Yes                                       +--------+---------------+---------+-----------+----------+--------------------+ FV Mid  Full           Yes      Yes                                       +--------+---------------+---------+-----------+----------+--------------------+ FV      Full           Yes      Yes                                       Distal                                                                    +--------+---------------+---------+-----------+----------+--------------------+ PFV     Full           Yes      Yes                                       +--------+---------------+---------+-----------+----------+--------------------+ POP     Full           Yes      Yes                                       +--------+---------------+---------+-----------+----------+--------------------+  PTV                                                  unable to visualize  +--------+---------------+---------+-----------+----------+--------------------+ PERO                                                 unable to  visualize  +--------+---------------+---------+-----------+----------+--------------------+ CFV & SFJ not well visualized patient unable to tolerate any pressure from probe. Severe pain with light touch.  Right Technical Findings: Not visualized segments include peroneal and posterior tibial veins.  +---------+---------------+---------+-----------+----------+-------------------+ LEFT     CompressibilityPhasicitySpontaneityPropertiesThrombus Aging      +---------+---------------+---------+-----------+----------+-------------------+ CFV      Full           Yes      Yes                                      +---------+---------------+---------+-----------+----------+-------------------+ SFJ      Full                                                             +---------+---------------+---------+-----------+----------+-------------------+ FV Prox  Full           Yes      Yes                                      +---------+---------------+---------+-----------+----------+-------------------+ FV Mid   Full           Yes      Yes                                      +---------+---------------+---------+-----------+----------+-------------------+ FV DistalFull           Yes      Yes                                      +---------+---------------+---------+-----------+----------+-------------------+ PFV      Full                                                             +---------+---------------+---------+-----------+----------+-------------------+ POP      Full           Yes      Yes                                      +---------+---------------+---------+-----------+----------+-------------------+ PTV  Not well visualized +---------+---------------+---------+-----------+----------+-------------------+ PERO                                                  Not well visualized  +---------+---------------+---------+-----------+----------+-------------------+    Summary: BILATERAL: - No evidence of deep vein thrombosis seen in the lower extremities in areas visualized, bilaterally. -No evidence of popliteal cyst, bilaterally. RIGHT: Subcutaneous edema of calf and ankle   *See table(s) above for measurements and observations.    Preliminary    DG Chest Portable 1 View Result Date: 06/01/2024 CLINICAL DATA:  orthopnea, LEE EXAM: PORTABLE CHEST 1 VIEW COMPARISON:  Chest x-ray 04/17/2024 FINDINGS: The heart and mediastinal contours are within normal limits. No focal consolidation. No pulmonary edema. No pleural effusion. No pneumothorax. No acute osseous abnormality. IMPRESSION: No active disease. Electronically Signed   By: Morgane  Naveau M.D.   On: 06/01/2024 14:56     Procedures Procedures    Medications Ordered in ED Medications  methylPREDNISolone  sodium succinate (SOLU-MEDROL ) 40 mg/mL injection 40 mg (40 mg Intravenous Given 06/01/24 1458)  oxyCODONE  (Oxy IR/ROXICODONE ) immediate release tablet 5 mg (5 mg Oral Given 06/01/24 1445)  diphenhydrAMINE  (BENADRYL ) injection 25 mg (25 mg Intravenous Given 06/01/24 1756)  iohexol  (OMNIPAQUE ) 350 MG/ML injection 100 mL (100 mLs Intravenous Contrast Given 06/01/24 1837)    ED Course/ Medical Decision Making/ A&P                          Medical Decision Making Amount and/or Complexity of Data Reviewed Labs: ordered. Decision-making details documented in ED Course. Radiology: ordered. Decision-making details documented in ED Course.  Risk Prescription drug management.    This patient presents to the ED for concern of LEE, right leg pain, orthopnea, constipation; this involves an extensive number of treatment options, and is a complaint that carries with it a high risk of complications and morbidity.  I considered the following differential and admission for this acute, potentially life threatening condition.   MDM:     Must consider DVTs/PE. Will obtain CT PE but has contrast allergy and requires premedication. Also reports orthopnea. With LEE and orthopnea, consider heart failure or pulmonary edema. Lower c/f PNA with no productive cough or f/c. Does have h/o asthma but no wheezing heard on exam. Obtained CXR in the meantime while awaiting contrast premedication, which doesn't show any PNA, pleural effusion, or overt pulm edema.  On exam, patient does report inguinal pain. No distinct abnormalities on gross exam to indicate hernia or cellulitis/abscess. Does have inguinal TTP as well as R pelvic and suprapubic TTP. Post-void bladder scan doesn't show urinary retention. Consider cystitis, appendicitis, diverticulitis, or constipation/ileus. Lower c/f SBO with no N/V but his abdomen is distended. Will also obtain CT abd/pelvis w contrast.   Clinical Course as of 06/01/24 2248  Sat Jun 01, 2024  1308 BMP, CBC, lipase wnl [HN]  1436 B Natriuretic Peptide: 69.2 negative [HN]  1459 DG Chest Portable 1 View No active disease. [HN]  1459 Urinalysis, Routine w reflex microscopic -Urine, Clean Catch(!) Mild leuks, no UTI [HN]  1930 CT Angio Chest PE W and/or Wo Contrast 1. No acute abnormality in the chest, abdomen, or pelvis. 2. Hepatic steatosis. 3. Nonobstructing nephrolithiasis bilaterally. 4. Aortic Atherosclerosis (ICD10-I70.0).   [HN]  1930 VAS US  LOWER EXTREMITY VENOUS (DVT) (7a-7p) Summary:  BILATERAL: - No evidence of deep vein thrombosis seen in the lower extremities in areas visualized, bilaterally. -No evidence of popliteal cyst, bilaterally. RIGHT:     Subcutaneous edema of calf and ankle      *See table(s) above for measurements and observations.   [HN]    Clinical Course User Index [HN] Franklyn Sid SAILOR, MD    Patient's workup including CBC, BNP, CMP, lipase, chest x-ray, CT PE, and bilateral lower extremity DVT ultrasound was very reassuring.  He does not have any acute  abnormality in his chest abdomen or pelvis including proximal DVTs or intrapelvic DVTs.  He is hemodynamically stable with no hypoxia, tachypnea, fever.  UA does not show any UTI.  Believe patient is stable for outpatient follow-up.  I recommend patient follow-up with his outpatient PCP related to his lower extremity edema and to discuss his diuretic.  Patient was already taking 40 mg p.o. Lasix daily along with a potassium supplement.  I advised compression stockings but patient states it is very difficult for him to reach his feet.  For his inguinal pain I suspect likely it is related to muscular soreness or strain.  After his right hip arthroplasty patient had been using a walker until the last 3 days when he started to try to use the cane.  I advised Tylenol  1000 mg every 8 hours, heat/ice/stretching, and utilization of the walker as needed.  Patient already has follow-up with his orthopedic surgeon on 06/06/2024.  I advised follow-up with his PCP early next week as well for reevaluation.  Gave patient specific discharge instructions and return precautions, all questions answered to patient satisfaction.    Labs: I Ordered, and personally interpreted labs.  The pertinent results include:  those listed above  Imaging Studies ordered: I ordered imaging studies including CT PE, CT abd pelvis w contrast, BL DVT US  I independently visualized and interpreted imaging. I agree with the radiologist interpretation  Additional history obtained from chart review.    Cardiac Monitoring: The patient was maintained on a cardiac monitor.  I personally viewed and interpreted the cardiac monitored which showed an underlying rhythm of: NSR  Reevaluation: After the interventions noted above, I reevaluated the patient and found that they have :stayed the same  Social Determinants of Health: lives independently  Disposition:  DC w/ discharge instructions/return precautions. All questions answered to patient's  satisfaction.    Co morbidities that complicate the patient evaluation  Past Medical History:  Diagnosis Date   Arthritis    Asthma    mild, has rescue inhaler   BPH (benign prostatic hyperplasia)    Gallstones    Headache    stopped at age 7   History of kidney stones    passes 1-2 per year   Hypertension 2000   Dr. Kip   Pneumonia    Sleep apnea    CPAP   TIA (transient ischemic attack)    pt denies     Medicines Meds ordered this encounter  Medications   methylPREDNISolone  sodium succinate (SOLU-MEDROL ) 40 mg/mL injection 40 mg    IV methylprednisolone  will be converted to either a q12h or q24h frequency with the same total daily dose (TDD).  Ordered Dose: 1 to 125 mg TDD; convert to: TDD q24h.  Ordered Dose: 126 to 250 mg TDD; convert to: TDD div q12h.  Ordered Dose: >250 mg TDD; DAW.   DISCONTD: diphenhydrAMINE  (BENADRYL ) capsule 50 mg   DISCONTD: diphenhydrAMINE  (BENADRYL ) injection 50 mg   oxyCODONE  (  Oxy IR/ROXICODONE ) immediate release tablet 5 mg    Refill:  0   diphenhydrAMINE  (BENADRYL ) injection 25 mg   iohexol  (OMNIPAQUE ) 350 MG/ML injection 100 mL    I have reviewed the patients home medicines and have made adjustments as needed  Problem List / ED Course: Problem List Items Addressed This Visit   None Visit Diagnoses       Leg edema    -  Primary     Inguinal pain, right         Chronic right hip pain       Relevant Medications   methylPREDNISolone  sodium succinate (SOLU-MEDROL ) 40 mg/mL injection 40 mg (Completed)   oxyCODONE  (Oxy IR/ROXICODONE ) immediate release tablet 5 mg (Completed)                   This note was created using dictation software, which may contain spelling or grammatical errors.    Franklyn Sid SAILOR, MD 06/01/24 2250

## 2024-06-03 ENCOUNTER — Ambulatory Visit (HOSPITAL_COMMUNITY)

## 2024-06-04 DIAGNOSIS — R609 Edema, unspecified: Secondary | ICD-10-CM | POA: Diagnosis not present

## 2024-06-04 DIAGNOSIS — L089 Local infection of the skin and subcutaneous tissue, unspecified: Secondary | ICD-10-CM | POA: Diagnosis not present

## 2024-06-04 DIAGNOSIS — Z96649 Presence of unspecified artificial hip joint: Secondary | ICD-10-CM | POA: Diagnosis not present

## 2024-06-05 ENCOUNTER — Ambulatory Visit: Admitting: Orthopaedic Surgery

## 2024-06-05 DIAGNOSIS — Z471 Aftercare following joint replacement surgery: Secondary | ICD-10-CM | POA: Diagnosis not present

## 2024-06-05 DIAGNOSIS — Z7982 Long term (current) use of aspirin: Secondary | ICD-10-CM | POA: Diagnosis not present

## 2024-06-05 DIAGNOSIS — J45909 Unspecified asthma, uncomplicated: Secondary | ICD-10-CM | POA: Diagnosis not present

## 2024-06-05 DIAGNOSIS — N400 Enlarged prostate without lower urinary tract symptoms: Secondary | ICD-10-CM | POA: Diagnosis not present

## 2024-06-05 DIAGNOSIS — I10 Essential (primary) hypertension: Secondary | ICD-10-CM | POA: Diagnosis not present

## 2024-06-05 DIAGNOSIS — Z96641 Presence of right artificial hip joint: Secondary | ICD-10-CM | POA: Diagnosis not present

## 2024-06-06 DIAGNOSIS — N400 Enlarged prostate without lower urinary tract symptoms: Secondary | ICD-10-CM | POA: Diagnosis not present

## 2024-06-06 DIAGNOSIS — Z96641 Presence of right artificial hip joint: Secondary | ICD-10-CM | POA: Diagnosis not present

## 2024-06-06 DIAGNOSIS — Z7982 Long term (current) use of aspirin: Secondary | ICD-10-CM | POA: Diagnosis not present

## 2024-06-06 DIAGNOSIS — M1611 Unilateral primary osteoarthritis, right hip: Secondary | ICD-10-CM | POA: Diagnosis not present

## 2024-06-06 DIAGNOSIS — Z471 Aftercare following joint replacement surgery: Secondary | ICD-10-CM | POA: Diagnosis not present

## 2024-06-06 DIAGNOSIS — J45909 Unspecified asthma, uncomplicated: Secondary | ICD-10-CM | POA: Diagnosis not present

## 2024-06-06 DIAGNOSIS — I10 Essential (primary) hypertension: Secondary | ICD-10-CM | POA: Diagnosis not present

## 2024-06-07 DIAGNOSIS — N400 Enlarged prostate without lower urinary tract symptoms: Secondary | ICD-10-CM | POA: Diagnosis not present

## 2024-06-07 DIAGNOSIS — J45909 Unspecified asthma, uncomplicated: Secondary | ICD-10-CM | POA: Diagnosis not present

## 2024-06-07 DIAGNOSIS — Z7982 Long term (current) use of aspirin: Secondary | ICD-10-CM | POA: Diagnosis not present

## 2024-06-07 DIAGNOSIS — I10 Essential (primary) hypertension: Secondary | ICD-10-CM | POA: Diagnosis not present

## 2024-06-07 DIAGNOSIS — Z471 Aftercare following joint replacement surgery: Secondary | ICD-10-CM | POA: Diagnosis not present

## 2024-06-07 DIAGNOSIS — Z96641 Presence of right artificial hip joint: Secondary | ICD-10-CM | POA: Diagnosis not present

## 2024-06-10 ENCOUNTER — Ambulatory Visit: Admitting: Orthopaedic Surgery

## 2024-06-10 DIAGNOSIS — Z7982 Long term (current) use of aspirin: Secondary | ICD-10-CM | POA: Diagnosis not present

## 2024-06-10 DIAGNOSIS — N400 Enlarged prostate without lower urinary tract symptoms: Secondary | ICD-10-CM | POA: Diagnosis not present

## 2024-06-10 DIAGNOSIS — I10 Essential (primary) hypertension: Secondary | ICD-10-CM | POA: Diagnosis not present

## 2024-06-10 DIAGNOSIS — Z96641 Presence of right artificial hip joint: Secondary | ICD-10-CM | POA: Diagnosis not present

## 2024-06-10 DIAGNOSIS — J45909 Unspecified asthma, uncomplicated: Secondary | ICD-10-CM | POA: Diagnosis not present

## 2024-06-10 DIAGNOSIS — Z471 Aftercare following joint replacement surgery: Secondary | ICD-10-CM | POA: Diagnosis not present

## 2024-06-11 ENCOUNTER — Ambulatory Visit (INDEPENDENT_AMBULATORY_CARE_PROVIDER_SITE_OTHER): Admitting: Podiatry

## 2024-06-11 ENCOUNTER — Encounter: Payer: Self-pay | Admitting: Podiatry

## 2024-06-11 DIAGNOSIS — M79674 Pain in right toe(s): Secondary | ICD-10-CM

## 2024-06-11 DIAGNOSIS — M79675 Pain in left toe(s): Secondary | ICD-10-CM

## 2024-06-11 DIAGNOSIS — B351 Tinea unguium: Secondary | ICD-10-CM | POA: Diagnosis not present

## 2024-06-12 DIAGNOSIS — Z471 Aftercare following joint replacement surgery: Secondary | ICD-10-CM | POA: Diagnosis not present

## 2024-06-12 DIAGNOSIS — J45909 Unspecified asthma, uncomplicated: Secondary | ICD-10-CM | POA: Diagnosis not present

## 2024-06-12 DIAGNOSIS — M1611 Unilateral primary osteoarthritis, right hip: Secondary | ICD-10-CM | POA: Diagnosis not present

## 2024-06-12 DIAGNOSIS — G894 Chronic pain syndrome: Secondary | ICD-10-CM | POA: Diagnosis not present

## 2024-06-12 DIAGNOSIS — N400 Enlarged prostate without lower urinary tract symptoms: Secondary | ICD-10-CM | POA: Diagnosis not present

## 2024-06-12 DIAGNOSIS — I10 Essential (primary) hypertension: Secondary | ICD-10-CM | POA: Diagnosis not present

## 2024-06-12 DIAGNOSIS — M545 Low back pain, unspecified: Secondary | ICD-10-CM | POA: Diagnosis not present

## 2024-06-12 DIAGNOSIS — Z96641 Presence of right artificial hip joint: Secondary | ICD-10-CM | POA: Diagnosis not present

## 2024-06-12 DIAGNOSIS — M4726 Other spondylosis with radiculopathy, lumbar region: Secondary | ICD-10-CM | POA: Diagnosis not present

## 2024-06-12 DIAGNOSIS — Z79891 Long term (current) use of opiate analgesic: Secondary | ICD-10-CM | POA: Diagnosis not present

## 2024-06-12 DIAGNOSIS — M25551 Pain in right hip: Secondary | ICD-10-CM | POA: Diagnosis not present

## 2024-06-12 DIAGNOSIS — Z7982 Long term (current) use of aspirin: Secondary | ICD-10-CM | POA: Diagnosis not present

## 2024-06-12 NOTE — Progress Notes (Signed)
 Subjective: Chief Complaint  Patient presents with   RFC    RFC nail trim.  Legs and feet are swollen from hip surgery 04/29/24.  Non Diabetic.  No anti coag.     83 year old male presents the office for above concerns.  He states that his nails are thick causing discomfort is not able to trim them himself.  No swelling redness or drainage.  Lesions.  He has been having some chronic edema since he had recent orthopedic surgery.  He has been evaluated for DVTs and is seeing a Careers adviser for this.  [name], MD Last seen 03/14/2024  Objective: AAO x3, NAD DP/PT pulses palpable bilaterally, CRT less than 3 seconds Chronic edema present bilaterally but there is no erythema or warmth or any open lesions. Nails are hypertrophic, dystrophic, brittle, discolored, elongated 10. No surrounding redness or drainage. Tenderness nails 1-5 bilaterally. No open lesions or pre-ulcerative lesions are identified today. No pain with calf compression, swelling, warmth, erythema  Assessment: Symptomatic onychomycosis; neuropathy   Plan: -All treatment options discussed with the patient including all alternatives, risks, complications.  -Sharply debrided nails x 10 without any complications or bleeding.   -Previously discussed medication for neuropathy  -Daily foot inspection  Return in about 2 months (around 08/11/2024).  Donnice JONELLE Fees DPM

## 2024-06-13 DIAGNOSIS — Z7982 Long term (current) use of aspirin: Secondary | ICD-10-CM | POA: Diagnosis not present

## 2024-06-13 DIAGNOSIS — J45909 Unspecified asthma, uncomplicated: Secondary | ICD-10-CM | POA: Diagnosis not present

## 2024-06-13 DIAGNOSIS — Z96641 Presence of right artificial hip joint: Secondary | ICD-10-CM | POA: Diagnosis not present

## 2024-06-13 DIAGNOSIS — Z471 Aftercare following joint replacement surgery: Secondary | ICD-10-CM | POA: Diagnosis not present

## 2024-06-13 DIAGNOSIS — I10 Essential (primary) hypertension: Secondary | ICD-10-CM | POA: Diagnosis not present

## 2024-06-13 DIAGNOSIS — N400 Enlarged prostate without lower urinary tract symptoms: Secondary | ICD-10-CM | POA: Diagnosis not present

## 2024-06-15 DIAGNOSIS — Z96641 Presence of right artificial hip joint: Secondary | ICD-10-CM | POA: Diagnosis not present

## 2024-06-15 DIAGNOSIS — N400 Enlarged prostate without lower urinary tract symptoms: Secondary | ICD-10-CM | POA: Diagnosis not present

## 2024-06-15 DIAGNOSIS — J45909 Unspecified asthma, uncomplicated: Secondary | ICD-10-CM | POA: Diagnosis not present

## 2024-06-15 DIAGNOSIS — Z7982 Long term (current) use of aspirin: Secondary | ICD-10-CM | POA: Diagnosis not present

## 2024-06-15 DIAGNOSIS — Z471 Aftercare following joint replacement surgery: Secondary | ICD-10-CM | POA: Diagnosis not present

## 2024-06-15 DIAGNOSIS — I10 Essential (primary) hypertension: Secondary | ICD-10-CM | POA: Diagnosis not present

## 2024-06-16 DIAGNOSIS — I2510 Atherosclerotic heart disease of native coronary artery without angina pectoris: Secondary | ICD-10-CM | POA: Diagnosis not present

## 2024-06-18 DIAGNOSIS — N400 Enlarged prostate without lower urinary tract symptoms: Secondary | ICD-10-CM | POA: Diagnosis not present

## 2024-06-18 DIAGNOSIS — I10 Essential (primary) hypertension: Secondary | ICD-10-CM | POA: Diagnosis not present

## 2024-06-18 DIAGNOSIS — Z96641 Presence of right artificial hip joint: Secondary | ICD-10-CM | POA: Diagnosis not present

## 2024-06-18 DIAGNOSIS — J45909 Unspecified asthma, uncomplicated: Secondary | ICD-10-CM | POA: Diagnosis not present

## 2024-06-18 DIAGNOSIS — Z7982 Long term (current) use of aspirin: Secondary | ICD-10-CM | POA: Diagnosis not present

## 2024-06-18 DIAGNOSIS — Z471 Aftercare following joint replacement surgery: Secondary | ICD-10-CM | POA: Diagnosis not present

## 2024-06-20 DIAGNOSIS — Z96641 Presence of right artificial hip joint: Secondary | ICD-10-CM | POA: Diagnosis not present

## 2024-06-20 DIAGNOSIS — N400 Enlarged prostate without lower urinary tract symptoms: Secondary | ICD-10-CM | POA: Diagnosis not present

## 2024-06-20 DIAGNOSIS — I10 Essential (primary) hypertension: Secondary | ICD-10-CM | POA: Diagnosis not present

## 2024-06-20 DIAGNOSIS — Z7982 Long term (current) use of aspirin: Secondary | ICD-10-CM | POA: Diagnosis not present

## 2024-06-20 DIAGNOSIS — J45909 Unspecified asthma, uncomplicated: Secondary | ICD-10-CM | POA: Diagnosis not present

## 2024-06-20 DIAGNOSIS — Z471 Aftercare following joint replacement surgery: Secondary | ICD-10-CM | POA: Diagnosis not present

## 2024-06-21 DIAGNOSIS — G4733 Obstructive sleep apnea (adult) (pediatric): Secondary | ICD-10-CM | POA: Diagnosis not present

## 2024-06-21 DIAGNOSIS — Z23 Encounter for immunization: Secondary | ICD-10-CM | POA: Diagnosis not present

## 2024-06-21 DIAGNOSIS — R609 Edema, unspecified: Secondary | ICD-10-CM | POA: Diagnosis not present

## 2024-06-21 DIAGNOSIS — Z96649 Presence of unspecified artificial hip joint: Secondary | ICD-10-CM | POA: Diagnosis not present

## 2024-06-26 DIAGNOSIS — J45909 Unspecified asthma, uncomplicated: Secondary | ICD-10-CM | POA: Diagnosis not present

## 2024-06-26 DIAGNOSIS — Z471 Aftercare following joint replacement surgery: Secondary | ICD-10-CM | POA: Diagnosis not present

## 2024-06-26 DIAGNOSIS — Z96641 Presence of right artificial hip joint: Secondary | ICD-10-CM | POA: Diagnosis not present

## 2024-06-26 DIAGNOSIS — Z7982 Long term (current) use of aspirin: Secondary | ICD-10-CM | POA: Diagnosis not present

## 2024-06-26 DIAGNOSIS — I10 Essential (primary) hypertension: Secondary | ICD-10-CM | POA: Diagnosis not present

## 2024-06-26 DIAGNOSIS — N400 Enlarged prostate without lower urinary tract symptoms: Secondary | ICD-10-CM | POA: Diagnosis not present

## 2024-06-27 DIAGNOSIS — L299 Pruritus, unspecified: Secondary | ICD-10-CM | POA: Diagnosis not present

## 2024-06-27 DIAGNOSIS — R609 Edema, unspecified: Secondary | ICD-10-CM | POA: Diagnosis not present

## 2024-06-27 DIAGNOSIS — N644 Mastodynia: Secondary | ICD-10-CM | POA: Diagnosis not present

## 2024-06-27 DIAGNOSIS — L309 Dermatitis, unspecified: Secondary | ICD-10-CM | POA: Diagnosis not present

## 2024-06-28 DIAGNOSIS — N400 Enlarged prostate without lower urinary tract symptoms: Secondary | ICD-10-CM | POA: Diagnosis not present

## 2024-06-28 DIAGNOSIS — I10 Essential (primary) hypertension: Secondary | ICD-10-CM | POA: Diagnosis not present

## 2024-06-28 DIAGNOSIS — J45909 Unspecified asthma, uncomplicated: Secondary | ICD-10-CM | POA: Diagnosis not present

## 2024-06-28 DIAGNOSIS — Z96641 Presence of right artificial hip joint: Secondary | ICD-10-CM | POA: Diagnosis not present

## 2024-06-28 DIAGNOSIS — Z471 Aftercare following joint replacement surgery: Secondary | ICD-10-CM | POA: Diagnosis not present

## 2024-06-28 DIAGNOSIS — Z7982 Long term (current) use of aspirin: Secondary | ICD-10-CM | POA: Diagnosis not present

## 2024-07-01 DIAGNOSIS — R208 Other disturbances of skin sensation: Secondary | ICD-10-CM | POA: Diagnosis not present

## 2024-07-02 ENCOUNTER — Other Ambulatory Visit: Payer: Self-pay | Admitting: Family Medicine

## 2024-07-02 DIAGNOSIS — N644 Mastodynia: Secondary | ICD-10-CM

## 2024-07-08 ENCOUNTER — Ambulatory Visit (INDEPENDENT_AMBULATORY_CARE_PROVIDER_SITE_OTHER): Admitting: Orthopaedic Surgery

## 2024-07-08 ENCOUNTER — Encounter: Payer: Self-pay | Admitting: Orthopaedic Surgery

## 2024-07-08 DIAGNOSIS — M65311 Trigger thumb, right thumb: Secondary | ICD-10-CM | POA: Diagnosis not present

## 2024-07-08 DIAGNOSIS — M79641 Pain in right hand: Secondary | ICD-10-CM | POA: Diagnosis not present

## 2024-07-08 DIAGNOSIS — M79642 Pain in left hand: Secondary | ICD-10-CM

## 2024-07-08 NOTE — Progress Notes (Signed)
 The patient is a 83 year old gentleman well-known to the practice for hand issues.  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  He states that when he made his appointment he was still using a walker and his hands were hurting quite a bit.  However he is now walking without any assistive device and he says his hands have come down quite a bit.  He is not interested in any type of injection today but he may have consider that a few weeks ago.  On examination he does have grinding of the basilar thumb joint on both thumbs and some clicking over the A1 pulley and soreness on the left side and some soreness over the A1 pulley on the right side.  However there is truly no active triggering today.  He has been to try Voltaren  gel in time since he is feeling better overall and I agree with this.  If things worsen he knows to reach out and let us  know.

## 2024-07-10 DIAGNOSIS — M545 Low back pain, unspecified: Secondary | ICD-10-CM | POA: Diagnosis not present

## 2024-07-10 DIAGNOSIS — G894 Chronic pain syndrome: Secondary | ICD-10-CM | POA: Diagnosis not present

## 2024-07-10 DIAGNOSIS — M1611 Unilateral primary osteoarthritis, right hip: Secondary | ICD-10-CM | POA: Diagnosis not present

## 2024-07-10 DIAGNOSIS — M4726 Other spondylosis with radiculopathy, lumbar region: Secondary | ICD-10-CM | POA: Diagnosis not present

## 2024-07-10 DIAGNOSIS — M25551 Pain in right hip: Secondary | ICD-10-CM | POA: Diagnosis not present

## 2024-07-10 DIAGNOSIS — Z79891 Long term (current) use of opiate analgesic: Secondary | ICD-10-CM | POA: Diagnosis not present

## 2024-07-11 ENCOUNTER — Ambulatory Visit
Admission: RE | Admit: 2024-07-11 | Discharge: 2024-07-11 | Disposition: A | Discharge status: Home / Self Care | Source: Ambulatory Visit | Attending: Family Medicine | Admitting: Family Medicine

## 2024-07-11 DIAGNOSIS — N6489 Other specified disorders of breast: Secondary | ICD-10-CM | POA: Diagnosis not present

## 2024-07-11 DIAGNOSIS — R928 Other abnormal and inconclusive findings on diagnostic imaging of breast: Secondary | ICD-10-CM | POA: Diagnosis not present

## 2024-07-11 DIAGNOSIS — N644 Mastodynia: Secondary | ICD-10-CM

## 2024-07-15 DIAGNOSIS — L2989 Other pruritus: Secondary | ICD-10-CM | POA: Diagnosis not present

## 2024-07-16 DIAGNOSIS — I2510 Atherosclerotic heart disease of native coronary artery without angina pectoris: Secondary | ICD-10-CM | POA: Diagnosis not present

## 2024-07-18 DIAGNOSIS — L299 Pruritus, unspecified: Secondary | ICD-10-CM | POA: Diagnosis not present

## 2024-07-31 DIAGNOSIS — Z96643 Presence of artificial hip joint, bilateral: Secondary | ICD-10-CM | POA: Diagnosis not present

## 2024-08-01 DIAGNOSIS — L2089 Other atopic dermatitis: Secondary | ICD-10-CM | POA: Diagnosis not present

## 2024-08-07 DIAGNOSIS — M545 Low back pain, unspecified: Secondary | ICD-10-CM | POA: Diagnosis not present

## 2024-08-07 DIAGNOSIS — Z79891 Long term (current) use of opiate analgesic: Secondary | ICD-10-CM | POA: Diagnosis not present

## 2024-08-07 DIAGNOSIS — M1611 Unilateral primary osteoarthritis, right hip: Secondary | ICD-10-CM | POA: Diagnosis not present

## 2024-08-07 DIAGNOSIS — M4726 Other spondylosis with radiculopathy, lumbar region: Secondary | ICD-10-CM | POA: Diagnosis not present

## 2024-08-07 DIAGNOSIS — G894 Chronic pain syndrome: Secondary | ICD-10-CM | POA: Diagnosis not present

## 2024-08-07 DIAGNOSIS — M25551 Pain in right hip: Secondary | ICD-10-CM | POA: Diagnosis not present

## 2024-08-12 ENCOUNTER — Ambulatory Visit (INDEPENDENT_AMBULATORY_CARE_PROVIDER_SITE_OTHER): Admitting: Podiatry

## 2024-08-12 DIAGNOSIS — M79674 Pain in right toe(s): Secondary | ICD-10-CM | POA: Diagnosis not present

## 2024-08-12 DIAGNOSIS — M79675 Pain in left toe(s): Secondary | ICD-10-CM

## 2024-08-12 DIAGNOSIS — B351 Tinea unguium: Secondary | ICD-10-CM | POA: Diagnosis not present

## 2024-08-13 ENCOUNTER — Other Ambulatory Visit: Payer: Self-pay | Admitting: Vascular Surgery

## 2024-08-13 DIAGNOSIS — M7989 Other specified soft tissue disorders: Secondary | ICD-10-CM

## 2024-08-14 NOTE — Progress Notes (Signed)
 Subjective: No chief complaint on file.    83 year old male presents the office for above concerns.  He states that his nails are thick causing discomfort is not able to trim them himself.  Does not report any open wounds. No new concerns today.   Notes the swelling has improved some.  He is scheduled to see vascular.  Franklin Righter, MD Last seen 06/27/2024  Objective: AAO x3, NAD DP/PT pulses palpable bilaterally, CRT less than 3 seconds Chronic edema present bilaterally but there is no erythema or warmth or any open lesions. Nails are hypertrophic, dystrophic, brittle, discolored, elongated 10. No surrounding redness or drainage. Tenderness nails 1-5 bilaterally. No open lesions or pre-ulcerative lesions are identified today. No pain with calf compression, swelling, warmth, erythema  Assessment: Symptomatic onychomycosis; neuropathy   Plan: -All treatment options discussed with the patient including all alternatives, risks, complications.  -Sharply debrided nails x 10 without any complications or bleeding.   -Previously discussed medication for neuropathy  -Daily foot inspection  Return in about 3 months (around 11/12/2024).  Franklin Fisher DPM

## 2024-08-15 DIAGNOSIS — R208 Other disturbances of skin sensation: Secondary | ICD-10-CM | POA: Diagnosis not present

## 2024-08-15 DIAGNOSIS — L309 Dermatitis, unspecified: Secondary | ICD-10-CM | POA: Diagnosis not present

## 2024-08-15 DIAGNOSIS — L304 Erythema intertrigo: Secondary | ICD-10-CM | POA: Diagnosis not present

## 2024-08-16 DIAGNOSIS — I2510 Atherosclerotic heart disease of native coronary artery without angina pectoris: Secondary | ICD-10-CM | POA: Diagnosis not present

## 2024-08-19 ENCOUNTER — Encounter: Payer: Self-pay | Admitting: Radiology

## 2024-08-21 NOTE — Progress Notes (Unsigned)
 Cardiology Office Note:  .   Date:  08/22/2024  ID:  Franklin Fisher, DOB 1941/05/18, MRN [id number] PCP: [name], MD  [facility] Providers Cardiologist:  Candyce Reek, MD { History of Present Illness: .    Chief Complaint  Patient presents with   Edema    History of Present Illness   Franklin Fisher is an 83 year old male with aortic atherosclerosis and hypertension who presents for follow-up of LE edema.   He has been experiencing lower extremity edema following a right hip replacement surgery on April 29, 2024. Initially, both legs, feet, and thighs were swollen and remained so for a couple of months. He attributes the swelling to his high sodium diet, which he estimated at 5000 mg daily. After reducing his sodium intake to a couple of thousand milligrams per day, the swelling in his left leg resolved, and the right leg improved significantly within two weeks.  No history of vascular issues, congestive heart failure, heart attacks, lung issues, or strokes. No chest pain or trouble breathing. He underwent a recent CT PE study that was negative. DVT LE duplex negative.   His current medications include atenolol  100 mg daily for hypertension, which has been well-controlled, never exceeding 130 mmHg. He briefly took aspirin  post-surgery but discontinued it after a week.  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 He has never smoked or consumed alcohol .         Problem List Aortic atherosclerosis  HLD HTN    ROS: All other ROS reviewed and negative. Pertinent positives noted in the HPI.     Studies Reviewed: SABRA       TTE 03/29/2019  1. The left ventricle has normal systolic function with an ejection  fraction of 60-65%. The cavity size was normal. There is mild concentric  left ventricular hypertrophy. Left ventricular diastolic Doppler  parameters are consistent with impaired  relaxation.   2. The right ventricle has normal  systolic function. The cavity was  normal. There is no increase in right ventricular wall thickness.   3. No evidence of mitral valve stenosis.   4. The aortic valve is tricuspid. No stenosis of the aortic valve.   5. The ascending aorta and aortic root are normal in size and structure.  Physical Exam:   VS:  BP 128/76 (BP Location: Left Arm, Patient Position: Sitting, Cuff Size: Normal)   Pulse 62   Ht 5' 5 (1.651 m)   Wt 211 lb (95.7 kg)   SpO2 94%   BMI 35.11 kg/m    Wt Readings from Last 3 Encounters:  08/22/24 211 lb (95.7 kg)  04/29/24 210 lb (95.3 kg)  04/17/24 210 lb (95.3 kg)    GEN: Well nourished, well developed in no acute distress NECK: No JVD; No carotid bruits CARDIAC: RRR, no murmurs, rubs, gallops RESPIRATORY:  Clear to auscultation without rales, wheezing or rhonchi  ABDOMEN: Soft, non-tender, non-distended EXTREMITIES:  trace edema RLE ASSESSMENT AND PLAN: .   Assessment and Plan    Lower extremity edema post-hip surgery, improved with salt restriction Edema post-hip surgery improved with sodium restriction. Persistent mild right leg swelling likely due to surgery. Negative DVT imaging. No significant vascular or cardiac history. - Continue dietary sodium restriction. - No further testing unless symptoms change.  Essential hypertension, well controlled on current regimen Hypertension controlled with atenolol  100 mg daily. Blood pressure consistently below 130 mmHg. No cardiovascular events or risk factors. -  Continue atenolol  100 mg daily. - Follow up with cardiology as needed, especially after relocation to [facility],  .              Follow-up: Return if symptoms worsen or fail to improve.   Signed, Darryle DASEN. Barbaraann, MD, [facility]  [facility]  [address] [place], [place] [address] [phone]  2:02 PM

## 2024-08-22 ENCOUNTER — Encounter: Payer: Self-pay | Admitting: Cardiovascular Disease

## 2024-08-22 ENCOUNTER — Ambulatory Visit: Attending: Cardiovascular Disease | Admitting: Cardiovascular Disease

## 2024-08-22 VITALS — BP 128/76 | HR 62 | Ht 65.0 in | Wt 211.0 lb

## 2024-08-22 DIAGNOSIS — R202 Paresthesia of skin: Secondary | ICD-10-CM | POA: Diagnosis not present

## 2024-08-22 DIAGNOSIS — I10 Essential (primary) hypertension: Secondary | ICD-10-CM | POA: Insufficient documentation

## 2024-08-22 DIAGNOSIS — R600 Localized edema: Secondary | ICD-10-CM | POA: Diagnosis not present

## 2024-08-22 DIAGNOSIS — E782 Mixed hyperlipidemia: Secondary | ICD-10-CM | POA: Insufficient documentation

## 2024-08-22 NOTE — Patient Instructions (Signed)
 Medication Instructions:  Your physician recommends that you continue on your current medications as directed. Please refer to the Current Medication list given to you today.  *If you need a refill on your cardiac medications before your next appointment, please call your pharmacy*    Follow-Up: At [facility], you and your health needs are our priority.  As part of our continuing mission to provide you with exceptional heart care, our providers are all part of one team.  This team includes your primary Cardiologist (physician) and Advanced Practice Providers or APPs (Physician Assistants and Nurse Practitioners) who all work together to provide you with the care you need, when you need it.  Your next appointment:    As needed

## 2024-08-28 DIAGNOSIS — Z0000 Encounter for general adult medical examination without abnormal findings: Secondary | ICD-10-CM | POA: Diagnosis not present

## 2024-08-28 DIAGNOSIS — Z136 Encounter for screening for cardiovascular disorders: Secondary | ICD-10-CM | POA: Diagnosis not present

## 2024-08-28 DIAGNOSIS — L309 Dermatitis, unspecified: Secondary | ICD-10-CM | POA: Diagnosis not present

## 2024-08-28 DIAGNOSIS — G8929 Other chronic pain: Secondary | ICD-10-CM | POA: Diagnosis not present

## 2024-08-28 DIAGNOSIS — I10 Essential (primary) hypertension: Secondary | ICD-10-CM | POA: Diagnosis not present

## 2024-08-28 DIAGNOSIS — G2581 Restless legs syndrome: Secondary | ICD-10-CM | POA: Diagnosis not present

## 2024-08-28 DIAGNOSIS — R7309 Other abnormal glucose: Secondary | ICD-10-CM | POA: Diagnosis not present

## 2024-08-29 ENCOUNTER — Ambulatory Visit: Admitting: Cardiovascular Disease

## 2024-09-02 DIAGNOSIS — L309 Dermatitis, unspecified: Secondary | ICD-10-CM | POA: Diagnosis not present

## 2024-09-02 DIAGNOSIS — L304 Erythema intertrigo: Secondary | ICD-10-CM | POA: Diagnosis not present

## 2024-09-04 DIAGNOSIS — M4726 Other spondylosis with radiculopathy, lumbar region: Secondary | ICD-10-CM | POA: Diagnosis not present

## 2024-09-04 DIAGNOSIS — M545 Low back pain, unspecified: Secondary | ICD-10-CM | POA: Diagnosis not present

## 2024-09-04 DIAGNOSIS — G894 Chronic pain syndrome: Secondary | ICD-10-CM | POA: Diagnosis not present

## 2024-09-04 DIAGNOSIS — M1611 Unilateral primary osteoarthritis, right hip: Secondary | ICD-10-CM | POA: Diagnosis not present

## 2024-09-04 DIAGNOSIS — M25551 Pain in right hip: Secondary | ICD-10-CM | POA: Diagnosis not present

## 2024-09-04 DIAGNOSIS — Z79891 Long term (current) use of opiate analgesic: Secondary | ICD-10-CM | POA: Diagnosis not present

## 2024-09-19 ENCOUNTER — Encounter: Payer: Self-pay | Admitting: Vascular Surgery

## 2024-09-19 ENCOUNTER — Ambulatory Visit (HOSPITAL_COMMUNITY)
Admission: RE | Admit: 2024-09-19 | Discharge: 2024-09-19 | Disposition: A | Source: Ambulatory Visit | Attending: Vascular Surgery

## 2024-09-19 ENCOUNTER — Ambulatory Visit: Admitting: Vascular Surgery

## 2024-09-19 DIAGNOSIS — M7989 Other specified soft tissue disorders: Secondary | ICD-10-CM

## 2024-09-19 NOTE — Progress Notes (Signed)
 States he does not need appointment.  Unsure why he was here.  Not interested in being seen.

## 2024-09-20 DIAGNOSIS — K5900 Constipation, unspecified: Secondary | ICD-10-CM | POA: Diagnosis not present

## 2024-09-20 DIAGNOSIS — K069 Disorder of gingiva and edentulous alveolar ridge, unspecified: Secondary | ICD-10-CM | POA: Diagnosis not present

## 2024-09-20 DIAGNOSIS — K122 Cellulitis and abscess of mouth: Secondary | ICD-10-CM | POA: Diagnosis not present

## 2024-09-20 DIAGNOSIS — G2581 Restless legs syndrome: Secondary | ICD-10-CM | POA: Diagnosis not present

## 2024-10-02 DIAGNOSIS — Z79891 Long term (current) use of opiate analgesic: Secondary | ICD-10-CM | POA: Diagnosis not present

## 2024-10-02 DIAGNOSIS — G894 Chronic pain syndrome: Secondary | ICD-10-CM | POA: Diagnosis not present

## 2024-10-02 DIAGNOSIS — M25551 Pain in right hip: Secondary | ICD-10-CM | POA: Diagnosis not present

## 2024-10-02 DIAGNOSIS — M1611 Unilateral primary osteoarthritis, right hip: Secondary | ICD-10-CM | POA: Diagnosis not present

## 2024-10-02 DIAGNOSIS — M4726 Other spondylosis with radiculopathy, lumbar region: Secondary | ICD-10-CM | POA: Diagnosis not present

## 2024-10-02 DIAGNOSIS — M545 Low back pain, unspecified: Secondary | ICD-10-CM | POA: Diagnosis not present
